# Patient Record
Sex: Male | Born: 1950 | Race: White | Hispanic: No | Marital: Married | Smoking: Current every day smoker
Health system: Southern US, Community
[De-identification: ages and names within clinical notes are randomized; demographics above are authoritative.]

## PROBLEM LIST (undated history)

## (undated) DIAGNOSIS — F17210 Nicotine dependence, cigarettes, uncomplicated: Secondary | ICD-10-CM

## (undated) DIAGNOSIS — S069X9A Unspecified intracranial injury with loss of consciousness of unspecified duration, initial encounter: Secondary | ICD-10-CM

## (undated) DIAGNOSIS — G8929 Other chronic pain: Secondary | ICD-10-CM

## (undated) DIAGNOSIS — R569 Unspecified convulsions: Secondary | ICD-10-CM

## (undated) DIAGNOSIS — S069XAA Unspecified intracranial injury with loss of consciousness status unknown, initial encounter: Secondary | ICD-10-CM

## (undated) DIAGNOSIS — R110 Nausea: Secondary | ICD-10-CM

## (undated) DIAGNOSIS — G40909 Epilepsy, unspecified, not intractable, without status epilepticus: Secondary | ICD-10-CM

## (undated) DIAGNOSIS — M549 Dorsalgia, unspecified: Secondary | ICD-10-CM

## (undated) DIAGNOSIS — I259 Chronic ischemic heart disease, unspecified: Secondary | ICD-10-CM

## (undated) DIAGNOSIS — Z87898 Personal history of other specified conditions: Secondary | ICD-10-CM

## (undated) DIAGNOSIS — J449 Chronic obstructive pulmonary disease, unspecified: Secondary | ICD-10-CM

## (undated) DIAGNOSIS — K859 Acute pancreatitis without necrosis or infection, unspecified: Secondary | ICD-10-CM

## (undated) DIAGNOSIS — I509 Heart failure, unspecified: Secondary | ICD-10-CM

## (undated) DIAGNOSIS — E7800 Pure hypercholesterolemia, unspecified: Secondary | ICD-10-CM

## (undated) HISTORY — DX: Chronic ischemic heart disease, unspecified: I25.9

## (undated) HISTORY — DX: Dorsalgia, unspecified: M54.9

## (undated) HISTORY — DX: Chronic obstructive pulmonary disease, unspecified: J44.9

## (undated) HISTORY — DX: Nicotine dependence, cigarettes, uncomplicated: F17.210

## (undated) HISTORY — DX: Pure hypercholesterolemia, unspecified: E78.00

## (undated) HISTORY — DX: Nausea: R11.0

## (undated) HISTORY — DX: Personal history of other specified conditions: Z87.898

## (undated) HISTORY — PX: BACK SURGERY: SHX140

## (undated) HISTORY — PX: FACIAL RECONSTRUCTION SURGERY: SHX631

## (undated) HISTORY — PX: SHOULDER SURGERY: SHX246

## (undated) HISTORY — DX: Heart failure, unspecified: I50.9

## (undated) HISTORY — DX: Other chronic pain: G89.29

## (undated) HISTORY — DX: Epilepsy, unspecified, not intractable, without status epilepticus: G40.909

---

## 1998-03-07 ENCOUNTER — Inpatient Hospital Stay (HOSPITAL_COMMUNITY): Admission: EM | Admit: 1998-03-07 | Discharge: 1998-03-10 | Payer: Self-pay | Admitting: Emergency Medicine

## 1998-04-19 ENCOUNTER — Encounter: Admission: RE | Admit: 1998-04-19 | Discharge: 1998-04-19 | Payer: Self-pay | Admitting: Family Medicine

## 1998-05-07 ENCOUNTER — Other Ambulatory Visit: Admission: RE | Admit: 1998-05-07 | Discharge: 1998-05-07 | Payer: Self-pay | Admitting: Cardiology

## 1998-06-02 ENCOUNTER — Encounter: Admission: RE | Admit: 1998-06-02 | Discharge: 1998-08-31 | Payer: Self-pay | Admitting: Orthopaedic Surgery

## 1998-06-14 ENCOUNTER — Inpatient Hospital Stay (HOSPITAL_COMMUNITY): Admission: EM | Admit: 1998-06-14 | Discharge: 1998-06-21 | Payer: Self-pay | Admitting: Emergency Medicine

## 1998-06-14 ENCOUNTER — Encounter: Payer: Self-pay | Admitting: Emergency Medicine

## 1998-10-23 HISTORY — PX: CORONARY ARTERY BYPASS GRAFT: SHX141

## 1999-03-03 ENCOUNTER — Encounter: Payer: Self-pay | Admitting: Neurosurgery

## 1999-03-04 ENCOUNTER — Inpatient Hospital Stay (HOSPITAL_COMMUNITY): Admission: RE | Admit: 1999-03-04 | Discharge: 1999-03-05 | Payer: Self-pay | Admitting: Neurosurgery

## 1999-03-04 ENCOUNTER — Encounter: Payer: Self-pay | Admitting: Neurosurgery

## 1999-04-05 ENCOUNTER — Encounter: Payer: Self-pay | Admitting: Emergency Medicine

## 1999-04-05 ENCOUNTER — Inpatient Hospital Stay (HOSPITAL_COMMUNITY): Admission: EM | Admit: 1999-04-05 | Discharge: 1999-04-07 | Payer: Self-pay | Admitting: Emergency Medicine

## 1999-04-13 ENCOUNTER — Encounter: Admission: RE | Admit: 1999-04-13 | Discharge: 1999-04-13 | Payer: Self-pay | Admitting: Family Medicine

## 1999-05-16 ENCOUNTER — Encounter: Admission: RE | Admit: 1999-05-16 | Discharge: 1999-05-16 | Payer: Self-pay | Admitting: Family Medicine

## 1999-07-20 ENCOUNTER — Encounter: Admission: RE | Admit: 1999-07-20 | Discharge: 1999-08-12 | Payer: Self-pay | Admitting: Neurosurgery

## 1999-08-25 ENCOUNTER — Encounter: Admission: RE | Admit: 1999-08-25 | Discharge: 1999-08-25 | Payer: Self-pay | Admitting: Family Medicine

## 1999-11-09 ENCOUNTER — Encounter: Admission: RE | Admit: 1999-11-09 | Discharge: 1999-11-09 | Payer: Self-pay | Admitting: Family Medicine

## 1999-11-10 ENCOUNTER — Encounter: Admission: RE | Admit: 1999-11-10 | Discharge: 1999-11-10 | Payer: Self-pay | Admitting: Family Medicine

## 1999-12-27 ENCOUNTER — Inpatient Hospital Stay (HOSPITAL_COMMUNITY): Admission: EM | Admit: 1999-12-27 | Discharge: 2000-01-01 | Payer: Self-pay | Admitting: Emergency Medicine

## 1999-12-27 ENCOUNTER — Encounter: Payer: Self-pay | Admitting: Emergency Medicine

## 2000-03-22 ENCOUNTER — Encounter: Admission: RE | Admit: 2000-03-22 | Discharge: 2000-03-22 | Payer: Self-pay | Admitting: Family Medicine

## 2000-08-02 ENCOUNTER — Inpatient Hospital Stay (HOSPITAL_COMMUNITY): Admission: EM | Admit: 2000-08-02 | Discharge: 2000-08-06 | Payer: Self-pay | Admitting: Emergency Medicine

## 2000-08-02 ENCOUNTER — Encounter: Payer: Self-pay | Admitting: Emergency Medicine

## 2000-08-03 ENCOUNTER — Encounter: Payer: Self-pay | Admitting: Family Medicine

## 2000-12-27 ENCOUNTER — Encounter: Admission: RE | Admit: 2000-12-27 | Discharge: 2000-12-27 | Payer: Self-pay | Admitting: Family Medicine

## 2001-01-16 ENCOUNTER — Encounter: Payer: Self-pay | Admitting: Emergency Medicine

## 2001-01-16 ENCOUNTER — Inpatient Hospital Stay (HOSPITAL_COMMUNITY): Admission: EM | Admit: 2001-01-16 | Discharge: 2001-01-19 | Payer: Self-pay | Admitting: Emergency Medicine

## 2001-02-25 ENCOUNTER — Encounter: Admission: RE | Admit: 2001-02-25 | Discharge: 2001-02-25 | Payer: Self-pay | Admitting: Family Medicine

## 2001-03-01 ENCOUNTER — Encounter: Payer: Self-pay | Admitting: Family Medicine

## 2001-03-01 ENCOUNTER — Encounter: Admission: RE | Admit: 2001-03-01 | Discharge: 2001-03-01 | Payer: Self-pay | Admitting: Family Medicine

## 2001-03-11 ENCOUNTER — Encounter: Admission: RE | Admit: 2001-03-11 | Discharge: 2001-03-11 | Payer: Self-pay | Admitting: Family Medicine

## 2001-03-12 ENCOUNTER — Ambulatory Visit (HOSPITAL_COMMUNITY): Admission: RE | Admit: 2001-03-12 | Discharge: 2001-03-12 | Payer: Self-pay | Admitting: Family Medicine

## 2001-03-12 ENCOUNTER — Observation Stay (HOSPITAL_COMMUNITY): Admission: EM | Admit: 2001-03-12 | Discharge: 2001-03-13 | Payer: Self-pay | Admitting: Emergency Medicine

## 2001-03-12 ENCOUNTER — Encounter: Admission: RE | Admit: 2001-03-12 | Discharge: 2001-03-12 | Payer: Self-pay | Admitting: Family Medicine

## 2001-03-12 ENCOUNTER — Encounter: Payer: Self-pay | Admitting: Emergency Medicine

## 2001-03-14 ENCOUNTER — Ambulatory Visit (HOSPITAL_COMMUNITY): Admission: RE | Admit: 2001-03-14 | Discharge: 2001-03-14 | Payer: Self-pay | Admitting: Family Medicine

## 2001-06-01 ENCOUNTER — Inpatient Hospital Stay (HOSPITAL_COMMUNITY): Admission: EM | Admit: 2001-06-01 | Discharge: 2001-06-04 | Payer: Self-pay | Admitting: *Deleted

## 2001-06-01 ENCOUNTER — Encounter: Payer: Self-pay | Admitting: Sports Medicine

## 2001-06-27 ENCOUNTER — Encounter: Admission: RE | Admit: 2001-06-27 | Discharge: 2001-06-27 | Payer: Self-pay | Admitting: Family Medicine

## 2001-07-19 ENCOUNTER — Inpatient Hospital Stay (HOSPITAL_COMMUNITY): Admission: EM | Admit: 2001-07-19 | Discharge: 2001-07-25 | Payer: Self-pay | Admitting: Emergency Medicine

## 2001-07-19 ENCOUNTER — Encounter: Payer: Self-pay | Admitting: Cardiology

## 2001-07-19 ENCOUNTER — Encounter: Payer: Self-pay | Admitting: Emergency Medicine

## 2001-09-02 ENCOUNTER — Encounter: Admission: RE | Admit: 2001-09-02 | Discharge: 2001-09-02 | Payer: Self-pay | Admitting: Family Medicine

## 2001-09-11 ENCOUNTER — Encounter: Admission: RE | Admit: 2001-09-11 | Discharge: 2001-09-11 | Payer: Self-pay | Admitting: Family Medicine

## 2002-01-27 ENCOUNTER — Inpatient Hospital Stay (HOSPITAL_COMMUNITY): Admission: EM | Admit: 2002-01-27 | Discharge: 2002-01-29 | Payer: Self-pay | Admitting: Emergency Medicine

## 2002-01-27 ENCOUNTER — Encounter: Payer: Self-pay | Admitting: Emergency Medicine

## 2002-03-27 ENCOUNTER — Encounter: Admission: RE | Admit: 2002-03-27 | Discharge: 2002-03-27 | Payer: Self-pay | Admitting: Family Medicine

## 2002-03-28 ENCOUNTER — Encounter: Payer: Self-pay | Admitting: Family Medicine

## 2002-03-28 ENCOUNTER — Ambulatory Visit (HOSPITAL_COMMUNITY): Admission: RE | Admit: 2002-03-28 | Discharge: 2002-03-28 | Payer: Self-pay | Admitting: Family Medicine

## 2002-04-04 ENCOUNTER — Encounter: Admission: RE | Admit: 2002-04-04 | Discharge: 2002-04-04 | Payer: Self-pay | Admitting: Family Medicine

## 2002-08-29 ENCOUNTER — Encounter: Admission: RE | Admit: 2002-08-29 | Discharge: 2002-08-29 | Payer: Self-pay | Admitting: Family Medicine

## 2002-11-26 ENCOUNTER — Inpatient Hospital Stay (HOSPITAL_COMMUNITY): Admission: EM | Admit: 2002-11-26 | Discharge: 2002-12-03 | Payer: Self-pay | Admitting: Emergency Medicine

## 2002-11-26 ENCOUNTER — Encounter: Payer: Self-pay | Admitting: Emergency Medicine

## 2002-12-18 ENCOUNTER — Encounter: Admission: RE | Admit: 2002-12-18 | Discharge: 2002-12-18 | Payer: Self-pay | Admitting: Family Medicine

## 2003-04-13 ENCOUNTER — Encounter: Admission: RE | Admit: 2003-04-13 | Discharge: 2003-04-13 | Payer: Self-pay | Admitting: Family Medicine

## 2003-08-11 ENCOUNTER — Encounter: Admission: RE | Admit: 2003-08-11 | Discharge: 2003-08-11 | Payer: Self-pay | Admitting: Sports Medicine

## 2003-11-18 ENCOUNTER — Encounter: Admission: RE | Admit: 2003-11-18 | Discharge: 2003-11-18 | Payer: Self-pay | Admitting: Family Medicine

## 2004-04-29 ENCOUNTER — Encounter: Admission: RE | Admit: 2004-04-29 | Discharge: 2004-04-29 | Payer: Self-pay | Admitting: Sports Medicine

## 2004-08-24 ENCOUNTER — Inpatient Hospital Stay (HOSPITAL_COMMUNITY): Admission: EM | Admit: 2004-08-24 | Discharge: 2004-08-24 | Payer: Self-pay

## 2004-08-24 ENCOUNTER — Ambulatory Visit: Payer: Self-pay | Admitting: Family Medicine

## 2004-08-30 ENCOUNTER — Ambulatory Visit: Payer: Self-pay | Admitting: Family Medicine

## 2004-09-02 ENCOUNTER — Encounter: Admission: RE | Admit: 2004-09-02 | Discharge: 2004-09-02 | Payer: Self-pay | Admitting: Sports Medicine

## 2004-09-08 ENCOUNTER — Ambulatory Visit: Payer: Self-pay | Admitting: Sports Medicine

## 2005-08-03 ENCOUNTER — Inpatient Hospital Stay (HOSPITAL_COMMUNITY): Admission: EM | Admit: 2005-08-03 | Discharge: 2005-08-05 | Payer: Self-pay | Admitting: Emergency Medicine

## 2006-03-30 ENCOUNTER — Encounter: Admission: RE | Admit: 2006-03-30 | Discharge: 2006-03-30 | Payer: Self-pay | Admitting: Psychiatry

## 2006-04-26 ENCOUNTER — Ambulatory Visit: Payer: Self-pay | Admitting: Family Medicine

## 2006-05-11 ENCOUNTER — Ambulatory Visit: Payer: Self-pay | Admitting: Family Medicine

## 2006-06-08 ENCOUNTER — Ambulatory Visit (HOSPITAL_COMMUNITY): Admission: RE | Admit: 2006-06-08 | Discharge: 2006-06-08 | Payer: Self-pay | Admitting: Family Medicine

## 2006-06-08 ENCOUNTER — Ambulatory Visit: Payer: Self-pay | Admitting: Family Medicine

## 2006-07-27 ENCOUNTER — Ambulatory Visit: Payer: Self-pay | Admitting: Family Medicine

## 2006-08-17 ENCOUNTER — Ambulatory Visit: Payer: Self-pay | Admitting: Family Medicine

## 2006-08-17 ENCOUNTER — Encounter: Payer: Self-pay | Admitting: Internal Medicine

## 2006-08-17 ENCOUNTER — Inpatient Hospital Stay (HOSPITAL_COMMUNITY): Admission: EM | Admit: 2006-08-17 | Discharge: 2006-08-18 | Payer: Self-pay | Admitting: Emergency Medicine

## 2006-08-17 ENCOUNTER — Encounter: Payer: Self-pay | Admitting: Vascular Surgery

## 2006-08-17 ENCOUNTER — Ambulatory Visit: Payer: Self-pay | Admitting: Internal Medicine

## 2006-12-20 DIAGNOSIS — F17200 Nicotine dependence, unspecified, uncomplicated: Secondary | ICD-10-CM

## 2006-12-20 DIAGNOSIS — K219 Gastro-esophageal reflux disease without esophagitis: Secondary | ICD-10-CM | POA: Insufficient documentation

## 2006-12-20 DIAGNOSIS — R569 Unspecified convulsions: Secondary | ICD-10-CM | POA: Insufficient documentation

## 2006-12-20 DIAGNOSIS — E7800 Pure hypercholesterolemia, unspecified: Secondary | ICD-10-CM | POA: Insufficient documentation

## 2006-12-20 DIAGNOSIS — I2510 Atherosclerotic heart disease of native coronary artery without angina pectoris: Secondary | ICD-10-CM | POA: Insufficient documentation

## 2006-12-20 DIAGNOSIS — F5232 Male orgasmic disorder: Secondary | ICD-10-CM

## 2007-03-08 ENCOUNTER — Inpatient Hospital Stay (HOSPITAL_COMMUNITY): Admission: EM | Admit: 2007-03-08 | Discharge: 2007-03-20 | Payer: Self-pay | Admitting: Emergency Medicine

## 2007-03-12 HISTORY — PX: CARDIAC CATHETERIZATION: SHX172

## 2007-08-01 ENCOUNTER — Ambulatory Visit: Payer: Self-pay | Admitting: Family Medicine

## 2007-08-02 ENCOUNTER — Encounter (INDEPENDENT_AMBULATORY_CARE_PROVIDER_SITE_OTHER): Payer: Self-pay | Admitting: *Deleted

## 2007-08-21 ENCOUNTER — Encounter (INDEPENDENT_AMBULATORY_CARE_PROVIDER_SITE_OTHER): Payer: Self-pay | Admitting: Family Medicine

## 2007-08-21 ENCOUNTER — Ambulatory Visit: Payer: Self-pay | Admitting: Family Medicine

## 2007-08-21 DIAGNOSIS — I5022 Chronic systolic (congestive) heart failure: Secondary | ICD-10-CM

## 2007-08-23 ENCOUNTER — Encounter (INDEPENDENT_AMBULATORY_CARE_PROVIDER_SITE_OTHER): Payer: Self-pay | Admitting: Family Medicine

## 2007-12-25 ENCOUNTER — Ambulatory Visit: Payer: Self-pay | Admitting: Family Medicine

## 2007-12-25 ENCOUNTER — Encounter (INDEPENDENT_AMBULATORY_CARE_PROVIDER_SITE_OTHER): Payer: Self-pay | Admitting: Family Medicine

## 2008-01-09 ENCOUNTER — Encounter (INDEPENDENT_AMBULATORY_CARE_PROVIDER_SITE_OTHER): Payer: Self-pay | Admitting: Family Medicine

## 2008-04-13 ENCOUNTER — Emergency Department (HOSPITAL_COMMUNITY): Admission: EM | Admit: 2008-04-13 | Discharge: 2008-04-14 | Payer: Self-pay | Admitting: Emergency Medicine

## 2008-07-21 ENCOUNTER — Ambulatory Visit: Payer: Self-pay | Admitting: Family Medicine

## 2008-07-24 ENCOUNTER — Telehealth: Payer: Self-pay | Admitting: *Deleted

## 2008-08-19 ENCOUNTER — Telehealth: Payer: Self-pay | Admitting: *Deleted

## 2008-08-20 ENCOUNTER — Telehealth (INDEPENDENT_AMBULATORY_CARE_PROVIDER_SITE_OTHER): Payer: Self-pay | Admitting: Family Medicine

## 2008-10-12 ENCOUNTER — Encounter: Payer: Self-pay | Admitting: Sports Medicine

## 2008-10-18 ENCOUNTER — Encounter: Payer: Self-pay | Admitting: Sports Medicine

## 2008-10-26 ENCOUNTER — Encounter: Payer: Self-pay | Admitting: Sports Medicine

## 2009-01-26 ENCOUNTER — Ambulatory Visit: Payer: Self-pay | Admitting: Family Medicine

## 2009-02-08 ENCOUNTER — Telehealth (INDEPENDENT_AMBULATORY_CARE_PROVIDER_SITE_OTHER): Payer: Self-pay | Admitting: *Deleted

## 2009-02-08 ENCOUNTER — Ambulatory Visit: Payer: Self-pay | Admitting: Family Medicine

## 2010-01-06 ENCOUNTER — Encounter: Payer: Self-pay | Admitting: Sports Medicine

## 2010-08-15 ENCOUNTER — Encounter: Payer: Self-pay | Admitting: Sports Medicine

## 2010-08-24 ENCOUNTER — Ambulatory Visit: Payer: Self-pay | Admitting: Cardiology

## 2010-08-29 ENCOUNTER — Encounter: Payer: Self-pay | Admitting: Sports Medicine

## 2010-09-09 ENCOUNTER — Encounter: Admission: RE | Admit: 2010-09-09 | Discharge: 2010-09-09 | Payer: Self-pay | Admitting: Family Medicine

## 2010-09-09 ENCOUNTER — Ambulatory Visit: Payer: Self-pay | Admitting: Family Medicine

## 2010-09-09 ENCOUNTER — Telehealth: Payer: Self-pay | Admitting: *Deleted

## 2010-10-28 ENCOUNTER — Ambulatory Visit
Admission: RE | Admit: 2010-10-28 | Discharge: 2010-10-28 | Payer: Self-pay | Source: Home / Self Care | Attending: Family Medicine | Admitting: Family Medicine

## 2010-11-24 NOTE — Miscellaneous (Signed)
  Clinical Lists Changes  Problems: Changed problem from CHF (ICD-428.0) to CHRONIC SYSTOLIC HEART FAILURE (ICD-428.22)

## 2010-11-24 NOTE — Assessment & Plan Note (Signed)
Summary: head & neck pain,df   Vital Signs:  Patient profile:   60 year old male Height:      71.5 inches Weight:      169.13 pounds BMI:     23.34 Temp:     98.0 degrees F oral Pulse rate:   62 / minute BP sitting:   128 / 79  (right arm) Cuff size:   regular  Vitals Entered By: Jimmy Footman, CMA (September 09, 2010 10:43 AM) Is Patient Diabetic? No Pain Assessment Patient in pain? yes     Location: lefts side Intensity: 10+ Type: burning,sharp   Primary Care Provider:  Rodney Langton MD   History of Present Illness: 60 yo male with neck pain and seizures.  Seizures:  Only on depakote.  No other seizure meds. Apparently seeing and MD at [facility] neurology.  Pt has seizures daily.  Mostly at night.  Does not drive.  Pt unaware if neurologist knows about his daily seizures.  Neck pain:  Present 1 week. No trauma, no cause per pt.  No precipitating factors, better with heating pad.  L neck pain, radiating as a burning sensation down to hands, C5-T1 distribution per pt, all of arm and hand, medial and lateral aspects.    Current Medications (verified): 1)  Bayer Aspirin 325 Mg Tabs (Aspirin) .... Take 1 Tablet By Mouth Once A Day 2)  Fioricet 50-325-40 Mg Tabs (Butalbital-Apap-Caffeine) .... Take Up To 4 Tablet By Mouth Every 24 Hours 3)  Depakote 500 Mg  Tbec (Divalproex Sodium) .Marland Kitchen.. 1 Tablet 3 Times A Day 4)  Pravachol 20 Mg Tabs (Pravastatin Sodium) .... 2 Tablets A Day For Cholesterol 5)  Lasix 40 Mg Tabs (Furosemide) .Marland Kitchen.. 1 Tablet in Am 6)  Ranitidine Hcl 300 Mg Tabs (Ranitidine Hcl) .Marland Kitchen.. 1 Tablet in Am 7)  Lisinopril 5 Mg  Tabs (Lisinopril) .... Take 1 Tab Daily 8)  Carvedilol 6.25 Mg Tabs (Carvedilol) .Marland Kitchen.. 1 Tab By Mouth Bid 9)  Neurontin 300 Mg Caps (Gabapentin) .... One By Mouth Three Times A Day X 1 Week, Then Two Tabs By Mouth Three Times A Day If Pain Unresolved. 10)  Prednisone 50 Mg Tabs (Prednisone) .... One Tab By Mouth Daily X 5d 11)  Naprosyn 500 Mg Tabs  (Naproxen) .... One Tab By Mouth Two Times A Day For Pain  Allergies (verified): No Known Drug Allergies  Review of Systems       See HPI  Physical Exam  General:  Well-developed,well-nourished,in no acute distress; alert,appropriate and cooperative throughout examination Head:  Normocephalic and atraumatic without obvious abnormalities. Eyes:  No corneal or conjunctival inflammation noted. EOMI. Perrl Ears:  External ear exam shows no significant lesions or deformities. Nose:  External nasal examination shows no deformity or inflammation Mouth:  Oral mucosa and oropharynx without lesions or exudates.   Neck:  ROM limited to about 30 deg of rotation either side, 30 deg flexion, 30 deg extension.  POS spurlings on L neck rotation. Lungs:  Normal respiratory effort, chest expands symmetrically. Lungs are clear to auscultation, no crackles or wheezes. Heart:  Normal rate and regular rhythm. S1 and S2 normal without gallop, murmur, click, rub or other extra sounds. Neurologic:  Sensation decreased to soft touch on entire L arm.  DTRs 2+ biceps, triceps, brachioradialis.  Strength 5/5 to all movements.  Pulses 2+.   Impression & Recommendations:  Problem # 1:  CERVICAL RADICULOPATHY, LEFT (ICD-723.4) Assessment New XR with diffuse spondylosis. Prednisone burst. Neurontin up-taper. Naproxen  for pain. RTC 1-2 weeks to reassess.  Orders: [facility]- Est  Level 4 (04540) Diagnostic X-Sussman/Fluoroscopy (Diagnostic X-Annunziato/Flu)  Problem # 2:  CONVULSIONS, SEIZURES, NOS (ICD-780.39) Assessment: Deteriorated Called Dr. Geronimo Boot office, appt made for Monday 9am to manage seizures.  His updated medication list for this problem includes:    Depakote 500 Mg Tbec (Divalproex sodium) .Marland Kitchen... 1 tablet 3 times a day    Neurontin 300 Mg Caps (Gabapentin) ..... One by mouth three times a day x 1 week, then two tabs by mouth three times a day if pain unresolved.  Orders: [facility]- Est  Level 4  (98119)  Complete Medication List: 1)  Bayer Aspirin 325 Mg Tabs (Aspirin) .... Take 1 tablet by mouth once a day 2)  Fioricet 50-325-40 Mg Tabs (Butalbital-apap-caffeine) .... Take up to 4 tablet by mouth every 24 hours 3)  Depakote 500 Mg Tbec (Divalproex sodium) .Marland Kitchen.. 1 tablet 3 times a day 4)  Pravachol 20 Mg Tabs (Pravastatin sodium) .... 2 tablets a day for cholesterol 5)  Lasix 40 Mg Tabs (Furosemide) .Marland Kitchen.. 1 tablet in am 6)  Ranitidine Hcl 300 Mg Tabs (Ranitidine hcl) .Marland Kitchen.. 1 tablet in am 7)  Lisinopril 5 Mg Tabs (Lisinopril) .... Take 1 tab daily 8)  Carvedilol 6.25 Mg Tabs (Carvedilol) .Marland Kitchen.. 1 tab by mouth bid 9)  Neurontin 300 Mg Caps (Gabapentin) .... One by mouth three times a day x 1 week, then two tabs by mouth three times a day if pain unresolved. 10)  Prednisone 50 Mg Tabs (Prednisone) .... One tab by mouth daily x 5d 11)  Naprosyn 500 Mg Tabs (Naproxen) .... One tab by mouth two times a day for pain  Patient Instructions: 1)  Neurontin as directed. 2)  Prednisone as directed. 3)  Xrays. 4)  Naproxen for pain. 5)  You need to see your Neurologist at [facility] ASAP. 6)  Come back to see me in 1 week. 7)  -Dr. Karie Schwalbe. Prescriptions: NAPROSYN 500 MG TABS (NAPROXEN) One tab by mouth two times a day for pain  #30 x 0   Entered and Authorized by:   Rodney Langton MD   Signed by:   Rodney Langton MD on 09/09/2010   Method used:   Print then Give to Patient   RxID:   [id number] PREDNISONE 50 MG TABS (PREDNISONE) One tab by mouth daily x 5d  #5 x 0   Entered and Authorized by:   Rodney Langton MD   Signed by:   Rodney Langton MD on 09/09/2010   Method used:   Print then Give to Patient   RxID:   [id number] NEURONTIN 300 MG CAPS (GABAPENTIN) One by mouth three times a day x 1 week, then two tabs by mouth three times a day if pain unresolved.  #90 x 0   Entered and Authorized by:   Rodney Langton MD   Signed by:   Rodney Langton MD on 09/09/2010    Method used:   Print then Give to Patient   RxID:   [id number]    Orders Added: 1)  [facility]- Est  Level 4 [42595] 2)  Diagnostic X-Economos/Fluoroscopy [Diagnostic X-Buckner/Flu]

## 2010-11-24 NOTE — Miscellaneous (Signed)
  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

## 2010-11-24 NOTE — Progress Notes (Signed)
Summary: results  Phone Note Call from Patient Call back at Home Phone [phone]   Caller: Patient Summary of Call: pt is asking about xray results Initial call taken by: De Nurse,  September 09, 2010 3:53 PM  Follow-up for Phone Call        Severe spondylosis, or arthritis of spine. Follow-up by: Rodney Langton MD,  September 09, 2010 9:59 PM  Additional Follow-up for Phone Call Additional follow up Details #1::        pt is calling again about results Additional Follow-up by: De Nurse,  September 12, 2010 10:45 AM    Additional Follow-up for Phone Call Additional follow up Details #2::    Informed pt of xray results. Pt was to have a nuerologist appt today @ [facility]. He states that he did not have to go because he spoke with the nuerologist office. They asked him how many seizures he was having and he explained to them how many and how often...they said because it is the same issues there was no need for him to make the trip to [facility] to be seen in the office.  Follow-up by: Jimmy Footman, CMA,  September 12, 2010 12:11 PM

## 2010-11-24 NOTE — Miscellaneous (Signed)
 Summary: Orders Update  Clinical Lists Changes  Orders: Added new Test order of Lipid-[facility] [phone]) - Signed Added new Test order of PSA (Medicare)-[facility] (G0103) - Signed  Appended Document: Orders Update Advised pt that md wanted him to schedule lab appt and he said no, my heart doctor checks my blood everytime - Pt can be reached at [phone].  Appended Document: Orders Update patient reports he gets labwork regularly at Cardiologist office.  appointment scheduled here for labs on 10/27/07. advised patient Cardiologist probably has not checked PSA so he is agreeable to go ahead and schedule lab visit. contacted Cardiologist and he last had a lipid done  March 2009. is due to return in Feb 2010. will send message to MD to ask if lipids are still necessary at this time. will place lab report in MD box.  Appended Document: Orders Update Hi, Lipid panel cancelled, no need to do it again, thanks for the update, and we will still do the PSA.  -Dr. ONEIDA.  Appended Document: Orders Update    Clinical Lists Changes  Observations: Added new observation of ECHODUE: None (10/18/2008 12:10) Added new observation of LDLNXTDUE: 01/05/2009 (10/18/2008 12:10) Added new observation of HDLNXTDUE: 01/05/2009 (10/18/2008 12:10) Added new observation of LDL: 100 mg/dL (96/83/7990 87:88) Added new observation of HDL: 48 mg/dL (96/83/7990 87:89) Added new observation of CARDIAC EF: 15% % (03/12/2007 12:20) Added new observation of CARDIAC EF: 47% % (08/04/2005 12:15)      HDL Result Date:  01/06/2008 HDL Result:  48 HDL Next Due:  1 yr LDL Result Date:  01/06/2008 LDL Result:  100 LDL Next Due:  1 yr Ejection Fraction Result Date:  03/12/2007 Ejection Fraction Result:  15%

## 2010-11-24 NOTE — Letter (Signed)
Summary: Generic Letter  [facility]  [address]   [place], [place] [address]   Phone: [phone]  Fax: [phone]    01/06/2010  [name] [address] [place], [place]  [address]  Dear [name],   [name] are overdue for some preventive medical tests.  Please make an appt to see me as soon as is convenient for you.     Sincerely,   Rodney Langton MD

## 2010-11-24 NOTE — Assessment & Plan Note (Signed)
 Summary: shot for back/flu/wp   Vital Signs:  Patient Profile:   60 Years Old Male Weight:      174.5 pounds Temp:     98.3 degrees F oral Pulse rate:   76 / minute BP sitting:   148 / 90  (right arm)  Pt. in pain?   yes    Location:   lower back    Intensity:   8  Vitals Entered By: JACK BLOODGOOD CMA, (July 21, 2008 3:04 PM)              Is Patient Diabetic? No    Last Flu Vaccine:  Fluvax 3+ (08/01/2007 8:53:04 AM) Flu Vaccine Result Date:  07/21/2008 Flu Vaccine Result:  given Flu Vaccine Next Due:  1 yr TD Result Date:  07/21/2008 TD Result:  given TD Next Due:  10 yr   PCP:  DEBBY PETTIES MD  Chief Complaint:  back pain and sinus congestion .  History of Present Illness: 60 year old male with recent URI, back pain, and requesting Flu, pneumococcal, and Tetanus shots.  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  Pt has had chronic back pain, and typically flares up when the weather changes.  Has had many trigger point injections that relieve his symptoms.  No signs of acute cord compression, no new changes in bowel or bladder function.    Also c/o sinus congestion/pain/pressure x5 days.  He has this often since his facial surgery after his fall injury.  Typically responds to amoxicillin .  No fever, sore throat, cough, SOB.    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        Disc surg--5/00 -, vasc u/s- vert & carotid- no sig dz - 08/27/2006     Family History:    Reviewed history from 12/25/2007 and no changes required:       Mother w/ bone CA, uncle (father`s side) w/ bone CA.    Social History:    Reviewed history from 12/25/2007 and no changes required:       Married. 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  Smoking 2-3 ppd.     Risk Factors:     Counseled to quit/cut down tobacco use:  yes   Review of Systems       As above  in HPI   Physical Exam  General:     Well-developed,well-nourished,in no acute distress; alert,appropriate and cooperative throughout examination Head:     Normocephalic and atraumatic without obvious abnormalities.  Eyes:  No corneal or conjunctival inflammation noted. EOMI. Perrla. Ears:     External ear exam shows no significant lesions or deformities.  Otoscopic examination reveals clear canals, tympanic membranes are intact bilaterally without bulging, retraction, inflammation or discharge. Hearing is grossly normal bilaterally. Nose:     External nasal examination shows no deformity or inflammation. Nasal mucosa are pink and moist without lesions or exudates. Mouth:     Oral mucosa and oropharynx without lesions or exudates. Neck:     No deformities, masses, or tenderness noted. Lungs:     Normal respiratory effort, chest expands symmetrically. Lungs are clear to auscultation, no crackles or wheezes. Heart:     Normal rate and regular rhythm. S1 and S2 normal without gallop, murmur, click, rub or other extra sounds. Abdomen:     Bowel sounds positive,abdomen soft and non-tender without masses, organomegaly or hernias noted. Msk:     Strength 5/5 in upper extremeties, 4/5 left lower ext, 5/5 right lower ext.  sensation grossly intact.  Point tenderness just left lateral to spinous processes at 2 points on lumbar vertebrae, likely L2 and L3.    Pulses:     R and L carotid,radial,femoral,dorsalis pedis and posterior tibial pulses are full and equal bilaterally Neurologic:     No cranial nerve deficits noted.  Plantar reflexes are down-going bilaterally. DTRs 2+ but 1+ on left patellar. Sensory, motor and coordinative functions appear intact.    Impression & Recommendations:  Problem # 1:  BACK PAIN, LOW (ICD-724.2) Pt is a candidate for trigger point injections, I prepped the areas with betadine, anesthetized with cold spray, and injected 1/2cc Kenalog and 1/2cc 1% lidocaine  without Epi into each trigger point.  Pain resolved immediately and pt was able to sit up straight without pain.  His updated medication list for this problem includes:    Bayer Aspirin  325 Mg Tabs (Aspirin ) .SABRA... Take 1 tablet by mouth once a day    Fioricet 50-325-40 Mg Tabs (Butalbital-apap-caffeine) .SABRA... Take up to 4 tablet by mouth every 24 hours  Orders: [facility]- Est  Level 4 (00785) Trigger point injection- [facility] (79446)   Problem # 2:  CHF (ICD-428.0) Still hypertensive today, will increase lisinopril  to one full 5mg  tab daily.  I will slowly titrate his lisinopril  up to 40mg  daily and this should help his BP and reduce his cardiac mortality risk.   The following medications were removed from the medication list:    Metoprolol Tartrate 25 Mg Tabs (Metoprolol tartrate) .SABRA... Take 1 tablet by mouth twice a day    Plavix 75 Mg Tabs (Clopidogrel bisulfate) .SABRA... 1 tablet a day  His updated medication list for this problem includes:    Bayer Aspirin  325 Mg Tabs (Aspirin ) .SABRA... Take 1 tablet by mouth once a day    Lasix  40 Mg Tabs (Furosemide ) .SABRA... 1 tablet in am    Lisinopril  5 Mg Tabs (Lisinopril ) .SABRA... Take 1 tab daily    Carvedilol  6.25 Mg Tabs (Carvedilol ) .SABRA... 1 tab by mouth bid   Problem # 3:  PREVENTIVE HEALTH CARE (ICD-V70.0) Gave vaccines as listed below.  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  Will do amoxicillin  as it has helped in the past.  Pt can RTC in 2 weeks if no resolution of symptoms.   His updated medication list for this problem  includes:    Amoxicillin  500 Mg Tabs (Amoxicillin ) .SABRA... 1 tab by mouth two times a day for 10 days   Complete Medication List: 1)  Bayer Aspirin  325 Mg Tabs (Aspirin ) .... Take 1 tablet by mouth once a day 2)  Fioricet 50-325-40 Mg Tabs (Butalbital-apap-caffeine) .... Take up to 4 tablet by mouth every 24 hours 3)  Depakote  500 Mg Tbec (Divalproex  sodium) .SABRA.. 1 tablet 3 times a day 4)  Pravachol  20 Mg Tabs (Pravastatin  sodium) .... 2 tablets a day for cholesterol 5)  Lasix  40 Mg Tabs (Furosemide ) .SABRA.. 1 tablet in am 6)  Ranitidine Hcl 300 Mg Tabs (Ranitidine hcl) .SABRA.. 1 tablet in am 7)  Lisinopril  5 Mg Tabs (Lisinopril ) .... Take 1 tab daily 8)  Amoxicillin  500 Mg Tabs (Amoxicillin ) .SABRA.. 1 tab by mouth two times a day for 10 days 9)  Carvedilol  6.25 Mg Tabs (Carvedilol ) .SABRA.. 1 tab by mouth bid  Other Orders: Admin of Any Addtl Vaccine (09527)   Patient Instructions: 1)  Good to meet you today, hopefully you will feel better now that you have had the back injections.  Give it a few hours to work.  I will also send a prescription for amoxicillin  to your pharmacy.  You will take it for 10 days.  I will also increase your lisinopril  to 5mg  (whole tab) daily. 2)  Come see me again if your sinuses don't get better in a week. 3)  -Dr. Curtis.   Prescriptions: CARVEDILOL  6.25 MG TABS (CARVEDILOL ) 1 tab by mouth BID  #60 x 6   Entered and Authorized by:   DEBBY CURTIS MD   Signed by:   DEBBY CURTIS MD on 07/21/2008   Method used:   Electronically to        [facility]* (retail)       [address]       [place], [place]  [address]       Ph: [phone]       Fax: [phone]   RxID:    [id number] AMOXICILLIN  500 MG TABS (AMOXICILLIN ) 1 tab by mouth two times a day for 10 days  #20 x 0   Entered and Authorized by:   DEBBY CURTIS MD   Signed by:   DEBBY CURTIS MD on 07/21/2008   Method used:   Electronically to        [facility]* (retail)       [address]       [place], [place]  [address]       Ph: [phone]       Fax: [phone]   RxID:   [id number] LISINOPRIL  5 MG  TABS (LISINOPRIL ) take 1 tab daily  #30 x 6   Entered and Authorized by:   DEBBY CURTIS MD   Signed by:   DEBBY CURTIS MD on 07/21/2008   Method used:   Electronically to        [facility]* (retail)       [address]       [place], [place]  [address]       Ph: [phone]       Fax: [phone]   RxID:   [id number]  ]  Tetanus/Td Vaccine    Vaccine Type: Tdap    Site: left deltoid    Mfr: Sanofi Pasteur    Dose: 0.5 ml    Route: IM    Given by: JACK BLOODGOOD CMA,    Exp. Date: 09/24/2010  Lot #: [id number]    VIS given: 09/10/07 version given July 21, 2008.  Pneumovax Vaccine    Vaccine Type: Pneumovax    Site: right deltoid    Mfr: Merck    Dose: 0.5 ml    Route: IM    Given by: JACK BLOODGOOD CMA,    Exp. Date: 09/17/2009    Lot #: [id number]    VIS given: 05/20/96 version given July 21, 2008.  Influenza Vaccine    Vaccine Type: Fluvax Non-MCR    Site: right deltoid    Mfr: GlaxoSmithKline    Dose: 0.5 ml    Route: IM    Given by: JACK BLOODGOOD CMA,    Exp. Date: 04/21/2009    Lot #: [id number]    VIS given: 05/16/07 version given July 21, 2008.  Flu Vaccine Consent Questions    Do you have a history of severe allergic reactions to this vaccine? no    Any prior history of allergic reactions to egg and/or gelatin? no    Do you have a sensitivity to the preservative Thimersol? no    Do you have a past history of Guillan-Barre Syndrome? no    Do you currently have an acute febrile illness? no    Have you ever  had a severe reaction to latex? no    Vaccine information given and explained to patient? yes  Appended Document: shot for back/flu/wp              Complete Medication List: 1)  Bayer Aspirin  325 Mg Tabs (Aspirin ) .... Take 1 tablet by mouth once a day 2)  Fioricet 50-325-40 Mg Tabs (Butalbital-apap-caffeine) .... Take up to 4 tablet by mouth every 24 hours 3)  Depakote  500 Mg Tbec (Divalproex  sodium) .SABRA.. 1 tablet 3 times a day 4)  Pravachol  20 Mg Tabs (Pravastatin  sodium) .... 2 tablets a day for cholesterol 5)  Lasix  40 Mg Tabs (Furosemide ) .SABRA.. 1 tablet in am 6)  Ranitidine Hcl 300 Mg Tabs (Ranitidine hcl) .SABRA.. 1 tablet in am 7)  Lisinopril  5 Mg Tabs (Lisinopril ) .... Take 1 tab daily 8)  Amoxicillin  500 Mg Tabs (Amoxicillin ) .SABRA.. 1 tab by mouth two times a day for 10 days 9)  Carvedilol  6.25 Mg Tabs (Carvedilol ) .SABRA.. 1 tab by mouth bid    ]

## 2010-11-24 NOTE — Assessment & Plan Note (Signed)
Summary: cough/congestion x 3 wks/yellow-brownish phleghm/bmc   Vital Signs:  Patient profile:   60 year old male Height:      71.5 inches Weight:      174.2 pounds BMI:     24.04 Temp:     98.2 degrees F oral Pulse rate:   70 / minute BP sitting:   133 / 81  (right arm) Cuff size:   regular  Vitals Entered By: Jimmy Footman, CMA (October 28, 2010 10:08 AM) CC: congestion x2 weeks, wet cough Is Patient Diabetic? No   Primary Care Provider:  Rodney Langton MD  CC:  congestion x2 weeks and wet cough.  History of Present Illness: Cough and congestion for 3 weeks, exposed to grandchildren who were sick.  Denies SOB or fever.  Worried as he has a heart condition and he is not getting better.  His cardiologist told him to come here to be treated with a Z pack.  He has been using robitussin DM at home.  Habits & Providers  Alcohol-Tobacco-Diet     Tobacco Status: current     Cigarette Packs/Day: 0.75  Current Medications (verified): 1)  Bayer Aspirin 325 Mg Tabs (Aspirin) .... Take 1 Tablet By Mouth Once A Day 2)  Fioricet 50-325-40 Mg Tabs (Butalbital-Apap-Caffeine) .... Take Up To 4 Tablet By Mouth Every 24 Hours 3)  Depakote 500 Mg  Tbec (Divalproex Sodium) .Marland Kitchen.. 1 Tablet 3 Times A Day 4)  Pravachol 20 Mg Tabs (Pravastatin Sodium) .... 2 Tablets A Day For Cholesterol 5)  Lasix 40 Mg Tabs (Furosemide) .Marland Kitchen.. 1 Tablet in Am 6)  Ranitidine Hcl 300 Mg Tabs (Ranitidine Hcl) .Marland Kitchen.. 1 Tablet in Am 7)  Lisinopril 5 Mg  Tabs (Lisinopril) .... Take 1 Tab Daily 8)  Carvedilol 6.25 Mg Tabs (Carvedilol) .Marland Kitchen.. 1 Tab By Mouth Bid 9)  Zithromax Z-Pak 250 Mg Tabs (Azithromycin) .... Takes As Directed 10)  Cheratussin Ac 100-10 Mg/21ml Syrp (Guaifenesin-Codeine) .... 2 Teaspoonsful Qid As Needed For Cough, 200 Cc  Allergies (verified): No Known Drug Allergies  Review of Systems General:  Denies chills, fever, and sweats. CV:  Denies chest pain or discomfort. Resp:  Complains of cough and  sputum productive; denies shortness of breath and wheezing.  Physical Exam  General:  in no acute distress Ears:  TM retracted and injected Nose:  inflammed and with discharge Mouth:  post nasal drainage Neck:  No deformities, masses, or tenderness noted. Lungs:  normal respiratory effort, normal breath sounds, no crackles, and no wheezes.   Heart:  normal rate and regular rhythm.   Extremities:  no edema   Impression & Recommendations:  Problem # 1:  SINUSITIS, ACUTE (ICD-461.9)  prolonged respiratory symptoms in patient with heart failure. His updated medication list for this problem includes:    Zithromax Z-pak 250 Mg Tabs (Azithromycin) .Marland Kitchen... Takes as directed    Cheratussin Ac 100-10 Mg/77ml Syrp (Guaifenesin-codeine) .Marland Kitchen... 2 teaspoonsful qid as needed for cough, 200 cc  Orders: [facility]- Est Level  3 (16109)  Problem # 2:  CHRONIC SYSTOLIC HEART FAILURE (ICD-428.22) no signs of acute heart failure contributing to cough His updated medication list for this problem includes:    Bayer Aspirin 325 Mg Tabs (Aspirin) .Marland Kitchen... Take 1 tablet by mouth once a day    Lasix 40 Mg Tabs (Furosemide) .Marland Kitchen... 1 tablet in am    Lisinopril 5 Mg Tabs (Lisinopril) .Marland Kitchen... Take 1 tab daily    Carvedilol 6.25 Mg Tabs (Carvedilol) .Marland Kitchen... 1 tab  by mouth bid  Orders: [facility]- Est Level  3 (11914)  Complete Medication List: 1)  Bayer Aspirin 325 Mg Tabs (Aspirin) .... Take 1 tablet by mouth once a day 2)  Fioricet 50-325-40 Mg Tabs (Butalbital-apap-caffeine) .... Take up to 4 tablet by mouth every 24 hours 3)  Depakote 500 Mg Tbec (Divalproex sodium) .Marland Kitchen.. 1 tablet 3 times a day 4)  Pravachol 20 Mg Tabs (Pravastatin sodium) .... 2 tablets a day for cholesterol 5)  Lasix 40 Mg Tabs (Furosemide) .Marland Kitchen.. 1 tablet in am 6)  Ranitidine Hcl 300 Mg Tabs (Ranitidine hcl) .Marland Kitchen.. 1 tablet in am 7)  Lisinopril 5 Mg Tabs (Lisinopril) .... Take 1 tab daily 8)  Carvedilol 6.25 Mg Tabs (Carvedilol) .Marland Kitchen.. 1 tab by mouth bid 9)   Zithromax Z-pak 250 Mg Tabs (Azithromycin) .... Takes as directed 10)  Cheratussin Ac 100-10 Mg/42ml Syrp (Guaifenesin-codeine) .... 2 teaspoonsful qid as needed for cough, 200 cc  Patient Instructions: 1)  Take the medications as directed 2)  Drink a lot of water Prescriptions: CHERATUSSIN AC 100-10 MG/5ML SYRP (GUAIFENESIN-CODEINE) 2 teaspoonsful qid as needed for cough, 200 cc Brand medically necessary #1 x 0   Entered and Authorized by:   Luretha Murphy NP   Signed by:   Luretha Murphy NP on 10/28/2010   Method used:   Print then Give to Patient   RxID:   [id number] ZITHROMAX Z-PAK 250 MG TABS (AZITHROMYCIN) takes as directed Brand medically necessary #1 x 0   Entered and Authorized by:   Luretha Murphy NP   Signed by:   Luretha Murphy NP on 10/28/2010   Method used:   Print then Give to Patient   RxID:   [id number]    Orders Added: 1)  [facility]- Est Level  3 [02725]

## 2011-03-07 NOTE — Consult Note (Signed)
[name], [name] NO.:  [id number]   MEDICAL RECORD NO.:  [id number]          PATIENT TYPE:  INP   LOCATION:  4739                         FACILITY:  [facility]   PHYSICIAN:  Maree Krabbe, M.D.DATE OF BIRTH:  1951/09/27   DATE OF CONSULTATION:  03/17/2007  DATE OF DISCHARGE:                                 CONSULTATION   REASON FOR CONSULTATION:  Elevated creatinine.   HISTORY:  The patient is a 60 year old with a long history of coronary  artery disease, prior CABG, a chronic smoker and COPD.  He was admitted  on May 16 with chest pain and shortness of breath.  He had mild CHF with  interstitial pulmonary edema, and ruled in for a non-ST elevation MI.  His cardiac enzymes were slightly elevated.  He was felt to have a  hypertensive crisis also on admission, with high blood pressures which  were treated aggressively with IV nitroglycerin and blood pressure  medications.  He was put on heparin and baseline creatinine was 0.9.  On  Mar 12, 2007 he underwent a heart catheterization, pre-catheterization  creatinine was 1.0, post-catheterization creatinines were 1.1 and 1.2 on  the following 2 days; he seemed to tolerate this well.  However, over  the last 48 hours the patient has developed a rising creatinine up to  1.8 yesterday and 2.3 today, as well as hyperkalemia with potassium 6.2  yesterday and 5.2 today.  He has been on an ACE inhibitor with dose  progression, as well as diuretics and potassium supplements -- which are  now on hold as of today.   Currently the patient has no complaints.  He is calm and denies any  shortness of breath or chest pain.   PAST MEDICAL HISTORY:  1. COPD.  2. CAD with coronary artery bypass in 1995.  3. Chronic tobacco use.  4. 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  5. Hyperlipidemia.  6. GERD.  7. Hypertension.   CURRENT MEDICATIONS:  The patient is on aspirin, Lopressor, Colace,  Depakote, Protonix, Lipitor,  Plavix. and p.r.n. medications.  Lasix,  lisinopril and Kay Ciel have been held   SOCIAL HISTORY:  Continues to smoke.  Lives with his wife.  No alcohol  or drug use.   REVIEW OF SYSTEMS:  Denies fever, chills, sweats.  ENT:  Denies hearing  loss, visual change, sore throat or difficulty swallowing.  CARDIORESPIRATORY:  As above.  GI:  No nausea, vomiting, diarrhea or  abdominal pain.  GU:  No difficulty voiding.  He does not have a Foley  catheter in.  MUSCULOSKELETAL:  No myalgia, arthralgia or ankle edema.  NEUROLOGIC:  No focal numbness or weakness.   PHYSICAL EXAMINATION:  GENERAL:  This is a pleasant middle-aged white  male in no distress.  VITAL SIGNS:  Blood pressure 110/75, temperature 98.1, pulse 62,  respirations 20, O2 saturation 97% on room air.  I/Os the last 24 hours  were 960 in and 2100 out.  The previous day was 980 in and 2450 out on  diuretics.  The patient is alert and  oriented.  SKIN:  Warm and dry without rash.  HEENT:  PERRL, EOMI.  Throat was clear.  NECK:  Supple without JVD.  Neck veins are flat.  CHEST:  Clear to the bases throughout.  CARDIAC:  Regular rate and rhythm without murmur or gallop.  ABDOMEN:  Soft, mildly obese, nontender, no bruits or organomegaly.  EXTREMITIES:  No femoral bruits.  No peripheral edema.  NEUROLOGIC:  No focal numbness or weakness.   LABS:  Sodium 132, potassium 5.2, bicarb 30, BUN 43, creatinine 2.3,  calcium 10.4 CHEST X-Prange:  (From May 16) showed interstitial edema with  CHF.  CT angiogram on 05/17 showed no PE.  Urinalysis negative for  protein, positive for red blood cells (7-10 per high-power field).   IMPRESSION:  1. Acute kidney injury secondary to a combination of poor ejection      fraction, ACE inhibitor effect, diuresis with effective volume      depletion and possible hemodynamic changes from the blood pressure      being too low.  The last dye was given on May 20 and it is not a      likely culprit.  He has  good urine output now on Lasix, but the      Lasix has just been stopped.  Agree with current management,      including stopping ACE inhibitor, Lasix and potassium.  In      addition, I will give IV fluids for 24-48 hours.  2. Non-ST elevation myocardial infarction, status post heart      catheterization  on Mar 12, 2007.  3. Prior coronary artery bypass grafting, with ejection fraction 15%.  4. Hyperkalemia.  5. Hypovolemia      Maree Krabbe, M.D.  Electronically Signed     RDS/MEDQ  D:  03/17/2007  T:  03/17/2007  Job:  [id number]

## 2011-03-07 NOTE — Discharge Summary (Signed)
NAMEZAVIEN, CLUBB                  ACCOUNT NO.:  [id number]   MEDICAL RECORD NO.:  [id number]          PATIENT TYPE:  INP   LOCATION:  4739                         FACILITY:  [facility]   PHYSICIAN:  Colleen Can. Deborah Chalk, M.D.DATE OF BIRTH:  26-Nov-1950   DATE OF ADMISSION:  03/08/2007  DATE OF DISCHARGE:  03/20/2007                               DISCHARGE SUMMARY   PRIMARY DISCHARGE DIAGNOSIS:  Chest pain with subsequent subendocardial  myocardial infarction with elective cardiac catheterization performed on  Mar 12, 2007, showing severe LV dysfunction with elevated left  ventricular filling pressures, occluded native circulation, occluded  saphenous vein graft to posterior descending and to the LAD with patent  saphenous vein graft to the acute margin and posterolateral branches.  There is a patent but severely diseased saphenous vein graft to the  diagonal.  Left internal mammary graft to the LAD is patent.  Overall,  his options were felt to be really limited and he will need medical  management.  At this point in time he is felt to be a very high risk  candidate for intervention on the saphenous vein graft to the diagonal.  He is not felt to be a candidate for redo surgery or for cardiac  transplantation in light of his other general medical condition and  history of noncompliance.   SECONDARY DISCHARGE DIAGNOSIS:  1. Extensive atherosclerotic cardiovascular disease.  He underwent      coronary artery bypass grafting in 1995 and has had multiple      procedures since that time.  2. Ongoing tobacco abuse.  3. Seizure disorder secondary to remote head trauma.  4. Dyslipidemia.  5. Noncompliance.  6. Gastroesophageal reflux disease.  7. Chronic headaches.  8. Post-procedural renal failure secondary to contrast and ACE      inhibitor therapy.   HISTORY OF PRESENT ILLNESS:  The patient is a 60 year old white male who  has multiple medical problems who presented to the hospital with  an  episode of chest pain as well as significant shortness of breath  consistent with pulmonary edema.  He was treated with nitrates,  anticoagulants and diuretics.  His cardiac enzymes were noted be  positive and consistent with subendocardial myocardial infarction.  He  underwent cardiac catheterization on Mar 12, 2007.  Those results are as  noted above.  His options were felt to be very limited.  He is not felt  to be a candidate at this point in time for high risk intervention nor  reduced coronary artery bypass grafting nor cardiac transplantation.  Medical management is felt to be the best option.  Postprocedure, he was  watched in the coronary care unit for several days.  Medicines were  initiated.  Extensive teaching was initiated as well.  It was hoped that  he would be would be discharged over the course of the Memorial Day  weekend, however, he had worsening renal insufficiency probably  multifactorial in nature.  He had been exposed to contrast as well as he  had been placed on ACE inhibitor therapy.  Renal consultation was  subsequently  called for and ACE were discontinued.  He was treated with  IV fluids.  He as progressed quite nicely since that time.  He did have  some hematuria which has resolved.  He is currently on an aspirin and  Plavix regimen.  He has had no recurrence of chest pain.  His seizure  disorder is at baseline and today, on Mar 20, 2007, he is up and  ambulatory with a walker.  Vital signs are unremarkable.  His BUN is 26  his creatinine is 1.4, his BNP is 521 and he is felt to be a stable  candidate for discharge.  Hopefully, he will follow through and present  for outpatient followup.   Discharge condition is guarded with somewhat overall poor prognosis.   DISCHARGE ACTIVITIES:  He is to use a walker as needed.  He may increase  his activity slowly.  He is reminded not to drive and at this point not  to engage in sexual activity.   His diet is to be  low sodium, heart-healthy and the dietician has been  made available to him.   He is strongly encouraged to remain off of his cigarettes.   DISCHARGE MEDICATIONS:  His discharge medicines include:  1. Aspirin 325 mg daily.  2. Plavix 75 mg daily.  He is given a coupon for 14 free pills.  3. Lopressor 25 b.i.d.  4. Depakote 500 three times a day.  5. Pravachol 40 mg a day.  6. Lasix 40 mg.  7. Potassium 20 mEq a day.  8. He will resume his Prilosec and Fioricet as he was taking before.   We have asked to go to [facility] to have his prescriptions filled.   RECOMMENDATIONS:  We have also asked him to weigh each morning and  record these readings.  If he gains more than 3 pounds in 24 hours we  will have him take an extra dose of Lasix with potassium.  He is to call  our office if any problems arise in the interim otherwise, we will plan  on seeing him approximately 1 week with repeat lab on return.      Sharlee Blew, N.P.      Colleen Can. Deborah Chalk, M.D.  Electronically Signed    LC/MEDQ  D:  03/20/2007  T:  03/20/2007  Job:  [id number]

## 2011-03-07 NOTE — Consult Note (Signed)
NAMEDREAM, NODAL                  ACCOUNT NO.:  [id number]   MEDICAL RECORD NO.:  [id number]          PATIENT TYPE:  INP   LOCATION:  4739                         FACILITY:  [facility]   PHYSICIAN:  Michael L. Reynolds, M.D.DATE OF BIRTH:  16-Dec-1950   DATE OF CONSULTATION:  03/17/2007  DATE OF DISCHARGE:                                 CONSULTATION   REQUESTING PHYSICIAN:  Dr. Verdis Prime   REASON FOR EVALUATION:  Epilepsy.   HISTORY OF PRESENT ILLNESS:  This is the initial inpatient consultation  and evaluation of this 61 year old man with a complex cardiac history as  well as a history of previous head injury with subsequent epilepsy  dating back to 68.  The patient is presently admitted for refractory  chest pain following a subendocardial MI and has had a cardiac  catheterization on Mar 12, 2007 which at this time has resulted in  medical management along with consideration of possible coronary bypass  redo.  He has had a couple of seizure events during this hospital stay  and subsequently his wife has requested neurologic opinion.  As noted  above, the patient has had seizures dating back to a head injury in  1982.  He is normally managed at [facility].  He states that he has  bee on numerous seizure medications in the past which have either not  worked or have been poorly tolerated.  These include phenobarbital,  Dilantin, Keppra, Neurontin, and others.  He has had imaging studies  done here including a CT of the head in October of 2007, an MRI of the  head in June of 2007, both demonstrating mild atrophy and chronic  microvascular ischemic changes.  The patient states that his seizures  are felt to be temporal lobe in origin related to damage there, although  that does not match up very well with any particular focal damage seen  on scans done here.  His wife states that he normally has seizures at a  rate of about 15 to 30 a month which tend to happen at least to some  extent in clusters.  These events are usually self-limited lasting a  couple of minutes at a time.  They usually consist of him sticking out  his tongue and being poorly responsive, sometimes associated with some  shaking.  He does not convulse and is particularly confused afterwards.  She does report that he has had epilepsy monitoring unit evaluations in  the past and he has had some episodes in the past which have been felt  to be non-epileptic in nature.  In the hospital, he has been continued  on his routine seizure regimen of Depakote 500 mg t.i.d., although the  dosing schedule is rather different from what he is taking at home.  His  wife states that the events that she has witnessed here in the hospital  are not atypical for him.  She believes that he has had about six events  here in the hospital over the course of his stay so far, which is  entering in his tenth  day.   PAST MEDICAL HISTORY:  Remarkable for:  1. The seizures following head injury as above.  2. He also has a long history of coronary artery disease status post      bypass grafting in the past.  His cardiac cath done on Mar 12, 2007      showed significant cardiomegaly with an EF 15 to 20%, apical      kinesis, occluded native circulation, and severely diseased grafts.   OTHER MEDICAL PROBLEMS:  Include:  1. Hypertension.  2. Dyslipidemia.  3. History of noncompliance of medications.  4. Ongoing tobacco abuse.   FAMILY/SOCIAL/REVIEW OF SYSTEMS:  As outlined in admission H&P of Mar 08, 2007 which is reviewed.   MEDICATIONS:  Prior to admission he was taking:  1. Depakote 500 mg t.i.d.  2. Prilosec.  3. Aspirin.  4. Fioricet p.r.n.   In the hospital, he is also receiving Lopressor, Colace, Lipitor,  potassium, Zestril, Plavix, Lasix.   PHYSICAL EXAMINATION:  VITAL SIGNS:  Temperature 98.1, blood pressure  139/65, pulse 67, respirations 18, O2 sat 98% on room air.  GENERAL EXAMINATION:  This is a  healthy-appearing man seen in no evident  distress.  HEART:  Cranium is normocephalic, atraumatic.  Oropharynx is benign.  NECK:  Supple without carotid bruits.  HEART:  Regular rate and rhythm without murmurs.  NEUROLOGICAL EXAMINATION:  Mental status:  He is awake and alert.  He is  oriented to time and place.  He is a little bit slow at answering  questions.  He is able to name objects and repeat phrases without  difficulty.  Cranial nerves:  Pupils are equal and reactive.  Extraocular movements are full without nystagmus.  Visual fields are  full with confrontation.  Hearing is intact to conversational speech.  Face, tongue and palate move normally and symmetrically.  Motor:  Normal  bulk and tone.  Normal strength in all tested extremity muscles.  Sensation:  Intact to light touch in all extremities.  Coordination:  Finger-to-nose performed accurate.  Gait is deferred.  Reflexes are  brisk throughout.  Toes are downgoing bilaterally.   LABORATORY REVIEW:  BMP from this morning:  Sodium 132, potassium 6.2,  BUN and creatinine 39 and 2.2, respectively.  CBC on May 23 was normal.  TSH on Mar 11, 2007 was in the high-normal range.  Depakote level on Mar 09, 2007 was 50.  It is not clear when the dosing cycle of this was  drawn.   He has not had any neuroimaging this admission.   IMPRESSION:  Intractable partial epilepsy +/- interspersed  pseudoseizures.  The nature of the events and their frequency are fairly  typical per the patient's wife who is at the bedside.  He has been on  numerous medications and this problem is chronically managed in [place].   PLAN:  I reassured the patient and his wife that his seizures do not  seem to be taking an unusual course here in the hospital.  We will  redistribute his Depakote dosing so that it is a little bit more even  throughout the day which may help in maintaining a more uniform plasma  level.  I did advise them that individual  seizure events will happen in  the hospital and that they not be treated as long as they are brief  (less than five minutes in duration), self-limited and his usual events.   Thank you for the consultation.  Michael L. Thad Ranger, M.D.  Electronically Signed     MLR/MEDQ  D:  03/17/2007  T:  03/17/2007  Job:  [id number]   cc:   Colleen Can. Deborah Chalk, M.D.

## 2011-03-07 NOTE — Cardiovascular Report (Signed)
[name], [name] NO.:  [id number]   MEDICAL RECORD NO.:  [id number]          PATIENT TYPE:  INP   LOCATION:  2901                         FACILITY:  [facility]   PHYSICIAN:  Colleen Can. Deborah Chalk, M.D.DATE OF BIRTH:  12-15-1950   DATE OF PROCEDURE:  03/12/2007  DATE OF DISCHARGE:                            CARDIAC CATHETERIZATION   PROCEDURE:  Left heart catheterization with selective coronary  angiography, left ventricular angiography, saphenous vein graft  angiography x2, angiography left internal mammary artery graft.   TYPE AND SITE OF ENTRY:  Percutaneous right femoral artery with  AngioSeal.   CATHETERS:  6-French four curved Judkins right and left coronary  catheters, 6-French pigtail ventriculographic catheter, left internal  mammary graft catheter.   CONTRAST MATERIAL:  Omnipaque.   MEDICATIONS GIVEN PRIOR TO PROCEDURE:  Valium 2 mg p.o.   MEDICATIONS GIVEN DURING PROCEDURE:  Versed 2 mg IV.   COMMENT:  The patient tolerated the procedure well.   HEMODYNAMIC DATA:  The aortic pressure was 126/79, LV was 135/22-37.  There was no aortic valve gradient noted on pullback.   ANGIOGRAPHIC DATA:  The left ventricular angiogram was performed in the  RAO position.  The overall cardiac size was increased.  Global ejection  fraction was in the 15-20% range.  There was very mild mitral  regurgitation.  There was generalized global hypokinesia.  The apex was  akinetic.   ANGIOGRAPHIC FINDINGS:  1. The left main coronary artery had a 20% narrowing.  2. The left anterior descending had a 70-90% ostial narrowing.  It was      totally occluded after the first septal perforating branch.  The      septal perforating branch supplied collaterals to the distal right      coronary artery.  3. Left circumflex.  Left circumflex had a totally occluded obtuse      marginal.  There were irregularities in the body of the left      circumflex as it continued to  posterolateral branch.  This supplied      collaterals to the distal right coronary artery.  4. Right coronary artery.  The right coronary artery is totally      occluded proximally.  5. Saphenous vein graft to the diagonal and left anterior descending.      There is an ostial 80% narrowing in the saphenous vein graft.  Then      in the distal mid portion of the graft, there is a very hazy 80%      narrowing with what appears to be a filling defect.  This would      appear to be the culprit vessel currently.  The flow only goes to      the diagonal vessel.  The section going to the anterior descending      is totally occluded.  There is no flow in the left anterior      descending antegrade.  There is some very slight retrograde flow in      the left anterior descending at the apex.  6. The left internal  mammary graft to the LAD is patent with a nice      insertion and good distal runoff.  7. Saphenous vein graft in the right coronary artery distribution to      the acute marginal, posterior descending and posterior lateral      branches showed diffuse disease throughout its course.  It was      patent to the acute margin and patent to the posterolateral branch      but occluded to the posterior descending.  Flow was slow, but      complete to the open branches.   OVERALL IMPRESSION:  1. Severe left ventricular dysfunction with elevated left ventricular      filling pressures.  2. Occluded native circulation.  3. Occluded saphenous vein graft to the posterior descending and to      the left anterior descending with patent saphenous vein graft to      the acute margin and posterolateral branches, patent but severely      diseased saphenous vein graft to the diagonal vessel.  4. Patent left internal mammary graft to the left anterior descending.   PLAN:  I think the options are really limited.  I think we need to treat  the congestive heart failure medically.  He has elevated filling   pressures.  I think after we tune him up completely, we could consider  him a candidate for very high risk intervention on the saphenous vein  graft.  I do not think he is a candidate for redo operation or for  cardiac transplant given his other general medical conditions.  In  particular it should be noted that he has seizure disorder with 10-20  seizures per month.      Colleen Can. Deborah Chalk, M.D.  Electronically Signed     SNT/MEDQ  D:  03/12/2007  T:  03/12/2007  Job:  [id number]

## 2011-03-10 NOTE — H&P (Signed)
NAMEHANSEN, CARINO                  ACCOUNT NO.:  [id number]   MEDICAL RECORD NO.:  [id number]          PATIENT TYPE:  INP   LOCATION:  2017                         FACILITY:  [facility]   PHYSICIAN:  Kerby Nora, MD        DATE OF BIRTH:  May 20, 1951   DATE OF ADMISSION:  08/23/2004  DATE OF DISCHARGE:                                HISTORY & PHYSICAL   PRIMARY CARE PHYSICIAN:  Nani Gasser, M.D.   CHIEF COMPLAINT:  Chest pain.   HISTORY OF PRESENT ILLNESS:  Mr. 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  He presented to the emergency room with chest pain  10/10, substernal, radiating to left shoulder, and left neck, which came at  rest along with nausea and mild shortness of breath.  At home, the patient  got no relief with nitroglycerin x 3 and Gaviscon for indigestion.  The  patient reports no change in chest pain with eating, breathing, movement, or  palpation.  The patient reports that he has not been taking his Metoprolol,  Norvasc, or Niacin in over a year secondary to expense.  Mr. Gavia has an  extensive history of frequent cardiac catheterization with the last one in  February of 2004 where no intervention was taken.   In the emergency room, the patient received nitroglycerin drip, Morphine 4  mg, and IV fluids with mild improvement in his chest pain that temporarily  lasted.   REVIEW OF SYMPTOMS:  No fever, no chills, no weight gain.  A 20 pound weight  loss because of decrease p.o. intake over the summer.  Positive chest pain  as described in the HPI.  No palpitations.  Positive shortness of breath x 1  day.  No cough, no wheeze, no pleuritic pain.  No vomiting, diarrhea.  Positive nausea with chest pain.  No melena.  No hematochezia.  No abdominal  pain.  No rash.  The patient does have seizure history from head trauma.  No  numbness.  The patient reports chronic cramps in his calves.  The  patient  wears glasses.  No sore throat.  No congestion, no hematuria, no dysuria, no  obvious bleeding.   PAST MEDICAL HISTORY:  1.  Hypercholesterolemia.  2.  Tobacco abuse.  3.  Coronary artery disease.  4.  GERD without esophagitis.  5.  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  6.  Frequent chronic headache.  7.  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  8.  Last hospitalization was secondary to chest pain in February of 2004.   MEDICATIONS:  1.  Aspirin 325 mg p.o. q.d.  2.  Depakote 500 mg q.a.m., at 6 p.m., and q.h.s.  3.  Nitroglycerin sublingual p.r.n. chest pain.  4.  Prilosec q.d.  5.  Barbital 1000 mg p.r.n. headache, one dose on the day of admission.  6.  Please note the patient is not taking metoprolol, Niacin, and Norvasc as      directed for over a  year.   ALLERGIES:  No known drug allergies.   PAST SURGICAL HISTORY:  1.  Status post coronary artery bypass graft x 6 in December 1995.  2.  Cardiac catheterization in 2001.  In 2002, had angioplasty of LAD and in      2004 no intervention was done.   SOCIAL HISTORY:  The patient is married.  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  The patient  is now on disability.  He denies alcohol use, IV drug use but smokes one  pack of cigarettes per day.  He normally does his own ADLs at home.  He does  not get any exercise.   FAMILY HISTORY:  Mother with bone carcinoma.  Uncle with bone carcinoma.  No  coronary artery disease less than age 15.   PHYSICAL EXAMINATION:  VITAL SIGNS:  Blood pressure 139/83, heart rate 69 to  88, respirations 18.  Saturation 100% on room air.  GENERAL:  Overweight-appearing gentleman, relaxed and in no apparent  distress.  Alert and oriented x 4.  PSYCHIATRIC:  Normal judgment and in sight.  NEUROLOGICAL:  Slow speech chronically secondary to past head trauma.  Cranial nerves II through XII grossly intact.  Reflexes 2+.   HEENT:  PERRLA.  Extraocular movements intact.  No pallor of conjunctivae.  Nares patent.  Tympanic membranes clear.  Moist mucus membranes.  Oropharynx  without exudate.  NECK:  No neck masses, no thyromegaly, no lymphadenopathy.  LUNGS:  Decreased breath sounds bilateral at bases.  Crackles bilaterally at  bases.  Normal respiratory effort.  No wheezes or rhonchi.  CARDIOVASCULAR:  Regular rate and rhythm.  No murmurs, gallops, or rubs.  CHEST:  Nontender to palpation.  No edema peripherally.  MUSCULOSKELETAL:  strength is 5/5 throughout.  ABDOMEN:  Soft and nontender.  Normal active bowel sounds.  No  hepatosplenomegaly.  SKIN:  No rash.  RECTAL:  No stool in rectal vault.  Hemoccult negative.   LABORATORY DATA:  I-stat shows sodium 142, potassium 3.3, chloride 106, CO2  38.6, BUN 14, creatinine 1, glucose 99.  Hemoglobin 15, hematocrit 44.  Point of care enzymes negative x 3.   Chest x-Derrick shows cardiomegaly with no edema and no acute disease.  EKG  shows normal sinus rhythm, T-wave inversion in II, III, aVF, V1, and V2.  Possible Q-wave in III, normal axis.  In comparison to EKG on February 2004,  there is no significant change.   ASSESSMENT/PLAN:  75.  A 60 year old male with atypical chest pain:  Given the fact that the      patient is high risk with history of coronary artery bypass x 6, we will      bring him for his atypical chest pain and rule him out for myocardial      infarction with cardiac enzymes x 3 and repeat electrocardiogram.      Currently, I will treat him with heparin, nitroglycerin drip, Morphine,      aspirin, oxygen, and beta blocker.  We will work on risk factor      modification as below.  We will call the patient's cardiologist, Dr.      Colleen Can. Tennant in the morning to discuss whether inpatient or      outpatient workup would be best.  Other possible etiologies for atypical     chest pain are anxiety, gastroesophageal reflux disease, esophageal       spasm.  We will treat gastroesophageal reflux disease with Prilosec.      The patient does not currently appear anxious.  2.  Hypokalemia:  Unsure of etiology, possibly related to decrease p.o.      intake.  We will replete p.r.n.  3.  Hypercholesterolemia:  We will check fasting lipid profile and we will      likely have to restart statin therapy.  The patient's goal is less than      100 LDL.  4.  Tobacco abuse:  Smoking cessation counseling.  5.  Seizure disorder:  Continue home medicine Depakote.  6.  Code status:  Do not resuscitate and do not intubate.  7.  Deep vein thrombosis prophylaxis:  The patient is ambulatory and will      likely continue to be so.  The patient is on heparin at the moment.  If      he goes off of heparin, we can consider prophylaxis if he does not      remain ambulatory for some reason.       AB/MEDQ  D:  08/24/2004  T:  08/24/2004  Job:  [id number]   cc:   Nani Gasser, M.D.  [address] [place], [place] [address]  Fax: [phone]

## 2011-03-10 NOTE — Discharge Summary (Signed)
[name], [name]                  ACCOUNT NO.:  [id number]   MEDICAL RECORD NO.:  [id number]          PATIENT TYPE:  INP   LOCATION:  2017                         FACILITY:  [facility]   PHYSICIAN:  Kerby Nora, MD        DATE OF BIRTH:  06/23/1951   DATE OF ADMISSION:  08/23/2004  DATE OF DISCHARGE:  08/24/2004                                 DISCHARGE SUMMARY   FAMILY PRACTICE TEACHING SERVICE   PRIMARY CARE PHYSICIAN:  Nani Gasser, M.D.   DIAGNOSES:  Chest pain, rule out for myocardial infarction.   SECONDARY DIAGNOSES:  1.  Tobacco abuse.  2.  Gastroesophageal reflux disease.  3.  Seizure disorder.  4.  Hypercholesterolemia.   DISCHARGE MEDICATIONS:  1.  Depakote 500 mg p.o. q.a.m., 500 mg p.o. at noon daily, and 1000 mg      q.h.s. p.o.  2.  Aspirin 325 mg p.o. daily.  3.  Metoprolol 25 mg p.o. b.i.d.  4.  Norvasc 2.5 mg p.o. daily.  5.  Niacin 50 mg p.o. b.i.d.  6.  Nitroglycerin 0.4 mg sublingual p.r.n.  7.  Prilosec over-the-counter 40 mg p.o. daily.   CONSULTS:  Dr. Deborah Chalk from Cardiology.   PROCEDURES:  None.   HOSPITAL ADMISSION:  The patient is a 60 year old white male with a history  of MI back in 2001, status post CABG, also with a history of high  cholesterol and tobacco abuse.  He presented to the ER with a reported 10/10  chest pain that was substernal and radiating to the shoulder and left arm at  rest with some nausea and mild shortness of breath.  The patient attempted  to relieve his pain with nitroglycerin x3 and was unsuccessful.  He also  described the pain, that it was not exacerbated by exertion.  He denied pain  with eating, breathing, or any palpitations.  He also reported that he has  not been taking his metoprolol, Norvasc, or niacin for over a year.  Therefore, he was admitted on November 2 for rule out myocardial infarction.  He received three tests for cardiac enzymes, and all were negative.  Also,  all EKG findings were negative  for any possible MI.  No Q waves were noted  on the EKGs.  It also should be noted that the patient has a significant  past medical history for being admitted to the hospital with chest pain and  for rule out MI, all of which have been negative in the recent past.  Therefore, with consultation with Dr. Deborah Chalk, it decided that the patient,  given the fact that he has negative cardiac markers and negative EKGs for  any MI findings, should be allowed to be discharged on the evening of  August 24, 2004.  Dr. Deborah Chalk requested that the patient be put back on all  of his medications including his metoprolol, Norvasc and niacin, which was  done, and a followup appointment should be made for the patient to see Dr.  Deborah Chalk in two weeks.  The patient was instructed of this and given Dr.  Tennant's phone number to call and made an appointment in two weeks.   LABORATORY FINDINGS:  As stated earlier, cardiac markers x3 were negative.  Repeated EKGs were also negative for any abnormal findings associated with  MI.   PROBLEM LIST:  1.  Atypical chest pain, rule out myocardial infarction.  The patient was      effectively ruled out for myocardial infarction and should followup with      his cardiologist, Dr. Deborah Chalk in two weeks.  He was also given      prescription for his metoprolol, Norvasc and niacin for which he has      been noncompliant for over a year, and explained that these medications      are important for preventing any reoccurrence myocardial infarctions,      and he should immediately restart taking these medications.  He was also      given prescriptions for Prilosec to cover his GERD symptoms and also      given nitroglycerin for any recurrent episodes of chest pain.  2.  Tobacco abuse.  The patient receiving smoking cessation counseling,      though it is in the opinion of the team that the patient has no true      desire to quit smoking at this time.  3.  Hypercholesterolemia.   The patient was placed back on his niacin.  He      was also told of his most recent cholesterol findings, which were done      in March of this year, which included an LDL of 178 and HDL of 38.      Therefore, it was instructed to the patient that he should get back on      his niacin to increase his HDL cholesterol and lower his LDL      cholesterol.  4.  Seizure disorder secondary to trauma. The patient was kept on his      Depakote medication while in the hospital.  The patient reported he had      plenty of Depakote medication to continue taking after he was      discharged.   DISCHARGE INSTRUCTIONS:  The patient was told that he has no restrictions on  activity, and that he should maintain a heart-healthy, low-fat, low-  cholesterol diet.  Wound care is not applicable.  No special instructions  were given to the patient.  Finally, the patient was given Dr. Ronnald Nian  phone number at [phone] and told to make a followup with Dr. Deborah Chalk, his  cardiologist, in two weeks.   No procedures were done on this patient during his hospital stay.       AB/MEDQ  D:  08/24/2004  T:  08/24/2004  Job:  [id number]

## 2011-03-10 NOTE — H&P (Signed)
[place]. [facility]  Patient:    [name], [name] Visit Number: [id number] MRN: [id number]          Service Type: MED Location: [facility] [id number] Attending Physician:  Eleanora Neighbor Dictated by:   Darci Needle, M.D. Admit Date:  01/27/2002 Discharge Date: 01/29/2002   CC:         Colleen Can. Deborah Chalk, M.D.  Dr. Shane Crutch, [facility], [place], [place]   History and Physical  REASON FOR ADMISSION:  Chest pain.  HISTORY OF PRESENT ILLNESS:  The patient is 60 years of age and has a history of coronary artery disease.  He is status post coronary artery bypass grafting in 1995 and since that time has had multiple hospital admissions for chest pain and has also had multiple percutaneous coronary interventions.  His most recent PCI was on the saphenous vein graft to the right coronary in September 2002.  He is also known to have LAD/left main stent based on the reports in the chart.  This morning the patient awakened at approximately 4 a.m. with chest pain.  It was unrelieved by nitroglycerin, and he came to the emergency room.  He continues to have chest discomfort although in the absence of EKG changes.  SIGNIFICANT MEDICAL PROBLEMS: 1. Atherosclerotic heart disease as outlined above. 2. Hypercholesterolemia. 3. History of traumatic brain injury in 1991. 4. History of seizure disorder. 5. Gastroesophageal reflux disease.  HABITS:  Smokes cigarettes.  Denies ethanol consumption.  ALLERGIES:  None.  MEDICATIONS:  Current medical therapy includes Prilosec 20 mg b.i.d., Zocor 20 mg per day (not taking), Depakote 500 mg t.i.d., aspirin one per day.  FAMILY HISTORY:  Please refer to prior records.  SOCIAL HISTORY:  Please refer to prior records.  REVIEW OF SYSTEMS:  Unremarkable other than as noted above.  PHYSICAL EXAMINATION:  VITAL SIGNS:  The patients blood pressure is 142/70, heart rate is 70. Respirations are 16.  GENERAL:  He is  sitting on the edge of the hospital gurney.  He is in no acute distress.  SKIN:  Clear.  No cyanosis is noted.  HEENT:  Pupils are equal and reactive.  Extraocular movements are full.  No xanthelasma.  NECK:  No JVD, carotid bruits, or adenopathy.  CHEST:  Lungs clear.  CARDIAC:  S4 gallop, otherwise unremarkable.  ABDOMEN:  Soft.  Liver edge not palpable.  EXTREMITIES:  No edema.  Pulses are 2+ and symmetric in upper and lower extremities.  NEUROLOGIC:  Decreased affect, otherwise no obvious focal deficits.  DIAGNOSTIC STUDIES:  EKG reveals nonspecific inferior and anterolateral T-wave changes.  Small Q-waves are noted.  The initial CK is 124 with an MB of 3.4, a troponin I of 0.04.  Potassium is 3.8, BUN and creatinine are 10 and 1.0.  Hemoglobin is 15.3.  ASSESSMENT:  Chest pain, etiology uncertain.  With the patients long history of coronary atherosclerosis and multiple percutaneous interventions, clearly myocardial ischemia is the etiology to be entertained.  PLAN: 1. Admit to rule out myocardial infarction with enzymes. 2. Further evaluation by Dr. Deborah Chalk. 3. IV nitroglycerin and IV heparin. Dictated by:   Darci Needle, M.D. Attending Physician:  Eleanora Neighbor DD:  01/27/02 TD:  01/27/02 Job: [id number]

## 2011-03-10 NOTE — Discharge Summary (Signed)
[name]                  ACCOUNT NO.:  [id number]   MEDICAL RECORD NO.:  [id number]          PATIENT TYPE:  INP   LOCATION:  3729                         FACILITY:  [facility]   PHYSICIAN:  Leighton Roach McDiarmid, M.D.DATE OF BIRTH:  [birth year]   DATE OF ADMISSION:  08/17/2006  DATE OF DISCHARGE:  08/18/2006                                 DISCHARGE SUMMARY   DISCHARGE DIAGNOSES:  1. Typical chest pain, resolved, no evidence of myocardial infarction.  2. Transient speech changes, resolved.  3. Hypercholesterolemia.  4. Tobacco abuse.  5. Coronary artery disease.  6. Gastroesophageal reflux.  7. History of seizures.  8. Status post coronary artery bypass graft x6 in December of 1995.   DISCHARGE MEDICATIONS:  1. Aspirin 325 mg p.o. daily.  2. Depakote 500 mg p.o. t.i.d.  3. Metoprolol 25 mg p.o. twice daily.  4. Niacin 100 mg p.o. twice daily.  5. Prilosec 20 mg p.o. twice daily.  6. Fioricet 1-2 tablets every four hours as needed for headache.   BRIEF HOSPITAL ADMISSION COURSE:  [name] is a 60 year old white male who is  status post CABG x6 in 1995 who presented to his primary care physician, Dr.  Raechel Ache on August 17, 2006 complaining of an episode of chest pain,  shortness of breath and diaphoresis similar to previous symptoms that  preceded cardiac interventions several years ago.  Pain had last 4-5 days.  The patient also had associated speech changes that were transient.  No  focal weakness reported.   PROCEDURES:  1. Chest x-Abee on August 17, 2006 with no acute abnormalities.  2. Carotid Dopplers August 17, 2006 with no RCA stenosis bilaterally,      right vertebral artery occluded, left vertebral artery flow antegrade.  3. Echocardiogram August 17, 2006 with left ventricular systolic function      normal.  EF estimated to be 55%, still inadequate for evaluation of      left ventricular regional wall motions, left ventricular wall thickness      mildly  increased, mild mitral anular calcification.   HOSPITAL COURSE:  1. Chest pain.  The patient was admitted on telemetry.  No changes during      duration on telemetry.  Only significant for sinus bradycardia to the      58-range.  No changes on the EKG as well as no elevation of cardiac      enzymes.  Therefore, cardiac etiology of chest pain ruled out.  Working      diagnosis likely gastroesophageal reflux versus costochondritis.  The      patient did have reproducible chest pain on exam on the last day of      admission.  He was started on Tylenol p.r.n.  The patient's vital signs      remained stable as well as stable off of supplemental oxygen.  We will      send the patient home on Tylenol p.r.n. as well as Prilosec twice daily      for possible etiologies of chest pain.  The patient was given red flags  before discharge and he will follow up with Dr. Raechel Ache within one      week of his discharge date for follow-up.  2. Speech changes.  The patient did not have any observable speech changes      during admission.  There were no neurological deficits and considering      no stenosis in carotid arteries, CVA highly unlikely.  This may be      associated with his history of seizures.  However, his Depakote level      was within normal limits at his admission.  No evidence of seizure      throughout his entire admission.  The patient will be sent home on his      current dosage of valproic acid.  He was given red flags to return to      the ED and he will be discharged in stable condition.   DISPOSITION:  Home.  Stable and improved.   FOLLOWUP:  The patient will call [facility] on Monday,  August 20, 2006 for an office visit to be scheduled within one week of his  discharge date with Dr. Raechel Ache.      Morley Kos, M.D.    ______________________________  Leighton Roach McDiarmid, M.D.    VRE/MEDQ  D:  08/18/2006  T:  08/18/2006  Job:   [id number]   cc:   [facility]

## 2011-03-10 NOTE — H&P (Signed)
[name], [name]                  ACCOUNT NO.:  [id number]   MEDICAL RECORD NO.:  [id number]          PATIENT TYPE:  INP   LOCATION:  3729                         FACILITY:  [facility]   PHYSICIAN:  Leighton Roach McDiarmid, M.D.DATE OF BIRTH:  [birth year]   DATE OF ADMISSION:  08/17/2006  DATE OF DISCHARGE:  08/18/2006                                HISTORY & PHYSICAL   CHIEF COMPLAINT:  Chest pain.   HISTORY OF PRESENT ILLNESS:  Patient is a 60 year old male with a  complicated past medical history noted for coronary artery disease status  post CABG with episodic chest pain, shortness of breath, and diaphoresis.  This is similar to previous symptoms that preceded his cardiac intervention  several years ago.  During the past 4 to 5 days, he has had these  intermittent episodes that last about 15 minutes.  They self-resolve.  They  are atypical from his previous GERD symptoms.  He also has a history of a  traumatic brain injury and a seizure disorder.  This complicates the fact  that he is also giving a history of episodic speech changes that are  occurring at the same time as the chest pain, shortness of breath, and  diaphoresis.  He has no focal weakness during these episodes but he says, I  feel weak all over when it happens.  He is chest pain free right now.  He  has no shortness of breath now.   REVIEW OF SYSTEMS:  No fevers, no chills.  CARDIOVASCULAR:  As above.  PULMONARY:  As above.  GI:  No bright red blood per rectum.  HEENT:  No  changes in vision.  SKIN:  No rash.   PROBLEM LIST:  History of hypercholesterolemia, smoking, arthrosclerosis of  the coronary arteries, gastroesophageal reflux disease, convulsions after a  traumatic brain injury, back pain, erectile dysfunction.   Family history is noted for a mother with bone cancer and his uncle on his  father's side also had bone cancer.   SOCIAL HISTORY:  He is married.  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  He is on  disability now.  He has smoked extensively.  He does not  use any illicit drugs.   MEDICATIONS:  Medications include:  1. Aspirin 325 mg p.o. daily.  2. Depakote 500 mg p.o. t.i.d.  3. Fioricet for headaches 4 p.o. per day p.r.n. pain.  4. Metoprolol 25 mg p.o. b.i.d.  5. Neurontin.  This has actually been discontinued.  The patient was not      able to tolerate this due to GI upset.  6. Niacin 100 mg p.o. b.i.d.  7. Nitroglycerin 0.4 mg sublingual p.r.n.  8. Prilosec 20 mg p.o. b.i.d.   ALLERGIES:  NO KNOWN DRUG ALLERGIES BUT HE IS INTOLERANT TO NEURONTIN.   OTHER PROCEDURES:  Patient had his CABG back in 1995.  His last cardiac cath  was in 2004.  He has been seen by Dr. Deborah Chalk.   PHYSICAL EXAM:  Afebrile.  Blood pressure 134/81.  Pulse 85.  Weight 209  pounds.  GENERAL APPEARANCE:  He is in no apparent distress.  He is alert and  oriented.  He is at his baseline mentation.  He has no changes in his  baseline speech.  His head is normocephalic, atraumatic.  Mucous membranes are moist.  Extraocular movements are intact.  Funduscopic exam:  It is difficult to  visualize the fundus.  Mouth:  No oropharyngeal erythema.  CHEST:  Clear to auscultation bilaterally.  No wheezes.  HEART:  Regular rate and rhythm.  ABDOMEN:  Soft, nontender, nondistended.  Positive bowel sounds.  EXTREMITIES:  No edema.  Pulses 2+ dorsalis pedis pulses and radial pulses.  I detect no carotid bruit.  RECTAL EXAM:  Deferred.  NEUROLOGIC:  He has normal Romberg.  His cerebellar function is noted for  mild intention tremor with finger-to-nose testing in the bilateral hands.  He has normal heel-to-shin on rapid alternating movement.  His cranial  nerves are intact bilaterally.  Strength is within normal limits in the  bilateral upper and lower extremities.  He has 2+ deep tendon reflexes in  the bilateral upper and lower extremities.  His gait is at his baseline.  It  is not unsteady.  SKIN:  He has no  rash.  Lymphadenopathy not appreciated.   LABORATORY DATA:  EKG showing flipped T waves in II, II and AVF along with  V5 and V6.  These were old findings.  He had no acute ST changes.  Otherwise, EKG was normal sinus rhythm.   ASSESSMENT AND PLAN:  Patient is a 60 year old male with the following  problems:  1. Chest pain.  Will admit to rule out MI.  I discussed with this with Dr.      Leitha Schuller and Dr. Cherly Hensen.  We will get serial enzymes and EKGs.  Check a      CBC, a C-met, and chest x-Fulgham.  He will need to follow up with      Cardiology at the discretion of the [facility].      He is chest pain free for now.  He is stable for transport.  His      cardiac causes are highly differential.  These are noted to be      different symptoms from his previous reflux.  2. Will put him in for a __________.  Continue his beta blocker and his      niacin and his aspirin.  3. Speech changes.  Given his baseline right now, now as far as his neuro      status is concerned, we will check a head CT, TEE and carotid Doppler      for TIA workup.  He is already on aspirin.  4. GERD.  PPI.  5. Seizure disorder.  Depakote level.  Continue home meds.  6. Heart-healthy diet.  7. Follow up with the [facility], Dr. Ludwig Clarks and      Dr. Dillard Essex.      Dwana Curd Para March, M.D.    ______________________________  Leighton Roach McDiarmid, M.D.    GSD/MEDQ  D:  08/17/2006  T:  08/18/2006  Job:  [id number]

## 2011-03-10 NOTE — Cardiovascular Report (Signed)
NAME:  [name], [name]                            ACCOUNT NO.:  [id number]   MEDICAL RECORD NO.:  [id number]                   PATIENT TYPE:  INP   LOCATION:  2927                                 FACILITY:  [facility]   PHYSICIAN:  Colleen Can. Deborah Chalk, M.D.            DATE OF BIRTH:  [birth year]   DATE OF PROCEDURE:  12/01/2002  DATE OF DISCHARGE:                              CARDIAC CATHETERIZATION   HISTORY:  The patient is referred for repeat catheterization after he  presented with substernal chest pain.  He had a stent to his left main  coronary artery approximately six months ago.  He has had previous coronary  artery bypass grafting.   PROCEDURES:  Left heart catheterization with selective coronary angiography,  left ventriculography, saphenous vein graft angiography x2, and angiography  of the left internal mammary artery.   TYPE AND SITE OF ENTRY:  Percutaneous right femoral artery.   CATHETERS:  Six French 4 curved Judkins right and left coronary catheters, 6  [place] no-torque right coronary catheter for the right coronary artery  bypass graft, internal mammary graft catheter for the internal mammary  artery.   MEDICATIONS GIVEN PRIOR TO PROCEDURE:  Valium 10 mg p.o.   MEDICATIONS GIVEN DURING PROCEDURE:  Versed 2 mg IV.   CONTRAST:  Pure Omnipaque.   COMMENTS:  The patient tolerated the procedure well.   HEMODYNAMIC DATA:  The aortic pressure was 120/68, LV was 109/9-13.  There  was no aortic valve gradient noted on pullback.   ANGIOGRAPHIC DATA:  1. Left main coronary artery had a distal 20% narrowing.  2. Left anterior descending was totally occluded after a large septal     perforator.  The septal perforator gave collateral branches inferiorly.  3. Left circumflex:  The left circumflex has one obtuse marginal that is     totally occluded.  There is a smaller obtuse marginal, a continuation to     the posterolateral wall, that also gives collaterals to the distal  right     coronary artery.  It gives collaterals to the posterior descending     vessel.  4. Right coronary artery:  The right coronary artery is totally occluded     approximately 5-7 cm from its ostium.  5. Saphenous vein graft in sequential fashion to the diagonal and left     anterior descending is patent.  The distal left anterior descending is     diffusely diseased with poor distal runoff.  This is not a new finding.  6. Saphenous vein graft to the acute marginal, posterior descending, and     posterolateral branch:  Has diffuse disease throughout its course.  It is     patent and gives flow to the acute marginal and to the posterolateral,     but it is occluded with the posterior descending vessel.  There is     somewhat slow flow to  this vessel and, quite frankly, the distal runoff     into these vessels is diffusely diseased.  7. Left internal mammary artery to the obtuse marginal was widely patent     with a nice insertion and good distal runoff.   Left ventricular angiogram:  Left ventricular angiogram was performed in the  RAO position.  Overall cardiac size is normal.  There is a discrete area of  inferior akinesia.  There is mild anterior hypokinesia.  The global ejection  fraction would be estimated to be in the 50-55% range.  There is no mitral  regurgitation, intracardiac calcification, or intracavitary filling defect.   OVERALL IMPRESSION:  1. Inferior akinesis with minimal anterior hypokinesia.  2. Totally occluded right coronary artery, totally occluded left circumflex,     totally occluded left anterior descending.  3. Patent stent in the left main coronary artery.  4. Patent saphenous vein graft to the acute marginal and posterolateral     branch with an occlusion to the posterior descending vessel, patent     sequential saphenous vein graft to the diagonal and left anterior     descending, and patent left internal mammary graft to the obtuse marginal      branch.   DISCUSSION:  In light of these findings and with the collateral flow, I  think that the patient can be managed medically.                                               Colleen Can. Deborah Chalk, M.D.    SNT/MEDQ  D:  12/01/2002  T:  12/01/2002  Job:  [id number]

## 2011-03-10 NOTE — Discharge Summary (Signed)
[place]  Patient:    [name], [name]                           MRN: [id number] Adm. Date:  03/12/01 Disc. Date: 03/13/01 Attending:  Santiago Bumpers. Leveda Anna, M.D. Dictator:   Zella Ball, M.D. CC:         Amparo Bristol, M.D.             Colleen Can. Deborah Chalk, M.D.                           Discharge Summary  DATE OF BIRTH:  [birth year]  DISCHARGE DIAGNOSES: 1. Chest pain, not otherwise specified. 2. Coronary artery disease with prior coronary artery bypass graft x6    in six vessel in 1995.  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 3. Seizure disorder. 4. Gastroesophageal reflux disease. 5. Tobacco abuse.  DISCHARGE MEDICATIONS: 1. Propranolol 60 mg p.o. q.d. 2. Aspirin 81 mg p.o. q.d. 3. Zocor 20 mg p.o. q.d. 4. Lorazepam 1 mg p.o. t.i.d. 5. Depakote 500 mg p.o. t.i.d. 6. Celebrex 100 mg p.o. t.i.d. 7. Prilosec 20 mg p.o. b.i.d. 8. Isosorbide dinitrate SR 80 mg p.o. b.i.d. 9. Nitroglycerin 0.4 mg sublingual p.r.n. chest pain.  DIET:  The patient is to follow a low fat diet.  SPECIAL INSTRUCTIONS:  The patient was instructed to stop smoking.  FOLLOWUP:  The patient is to follow up with: 1. Dr. Peyton Najjar with [facility] on Monday, May 27 at 8:40 a.m. 2. Dr. Deborah Chalk, cardiology, Friday, June 7 at 12:15 p.m. 3. The patient was also to keep his regularly scheduled appointments    with his neurologist.  CONSULTS WHILE IN HOSPITAL:  None.  PROCEDURES WHILE IN HOSPITAL:  None.  HISTORY OF PRESENT ILLNESS:  This is a 60 year old male with a history of extensive cardiovascular disease, who presented to the emergency room after sudden onset of chest pain while waiting for his regular appointment at the [facility] with Dr. Peyton Najjar.  He had an ECG at the clinic and was started on a nitroglycerin drip and then transferred over to the [facility] ED.  Apparently, the ECG was normal, and the  nitroglycerin actually did not help his chest pain which radiated both to his left chin and shoulder.  He did not have any palpitations with this.  No nausea.  No chills.  No weakness.  The patients recent history is significant for a subendocardial MI in March of 2001 at which time he underwent catheterization which did not show any lesions that were amenable to surgical correction.  He appeared to have diffuse small vessel disease.  Physical examination on presentation, afebrile at 97.4, respiratory rate 20, pulse 78, blood pressure 115/88.  His physical examination was unremarkable. A rectal check was performed which was Hemoccult negative.  LABORATORY DATA:  Initial laboratory tests, the patient had a white count of 8.3, hemoglobin of 15.3, platelets of 259.  PTT of 31 seconds, PT 28.8, INR of 1.  Initial troponin was 0.02, CK of 99, and a CK-MB of 2.2.  ECG in the emergency room was without significant ST elevations or depressions.  Assessment and plan at that time:  Given the patients significant cardiac history, it was decided to admit him to the telemetry unit and rule him out for myocardial infarction.  HOSPITAL COURSE:  The  patient had cardiac enzymes performed, CK-MB x3 and troponin x2, which were all negative for cardiac injury.  He was monitored on telemetry during this time period and he had no events.  Dr. Angelina Pih office was contacted and spoke with his PA, who did not feel that cardiology needed to see him during this hospitalization but would like to have him followup as an outpatient.  Of note, they stated that the patient had not followed up with his appointments as an outpatient in the past.  Prior to discharge, this was emphasized to the patient the severity of his cardiac condition and the fact that he really did need to follow up with cardiology given his very extensive disease.  A special note with this patient is the fact that he has had trouble  obtaining medications in the past because apparently he has some disability from a head injury, which he gets workmens compensation for.  However, workmens compensation will not pay for his cardiac medicines, only for his seizure medicines, so he is in a bit of a rock and a hard place as far as getting cardiac care, or at least cardiac medications.  Therefore, Dr. Peyton Najjar has, on his last outpatient visit, wrote to one of the drug companies to help the patient with some of his medications and that application is pending at the time of discharge.  In the meanwhile, we gave the patient as much as we could of the Zocor to get him through until when his application comes through. DD:  03/13/01 TD:  03/14/01 Job: [id number]

## 2011-03-10 NOTE — Discharge Summary (Signed)
[place]. [facility]  Patient:    [name], [name] Visit Number: [id number] MRN: [id number]          Service Type: MED Location: [phone] Attending Physician:  Eleanora Neighbor Dictated by:   Jennet Maduro Earl Gala, R.N., A.N.P. Admit Date:  07/19/2001 Discharge Date: 07/25/2001   CC:         Dr. Shane Crutch, [facility]   Discharge Summary  PRIMARY DISCHARGE DIAGNOSIS:  Recurrent chest pain with slight elevation in cardiac enzymes consistent with a non-Q-wave myocardial infarction with elective coronary angiography and subsequent angioplasty to the saphenous vein graft to the right coronary artery.  SECONDARY DISCHARGE DIAGNOSES: 1. Arteriosclerotic cardiovascular disease with previous coronary artery    bypass grafting in 1995.  He has a chronic chest pain syndrome and has    been admitted multiple times with recurrent episodes of intractable    angina.  His last cardiac catheterization had been in August of 2002 at    which time his bypass grafts were noted to be patent yet with severe    distal disease and after much debate it was elected to proceed on with    stenting of the left anterior descending as well as the left main coronary    artery. 2. Medical noncompliance. 3. Ongoing tobacco abuse. 4. Hypercholesterolemia. 5. 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 6. Gastroesophageal reflux disease.  HISTORY OF PRESENT ILLNESS:  [name] is a 60 year old male who has known coronary disease.  He also has known medical noncompliance.  He presents back to the hospital at the end of September with recurrent bouts of chest pain. He was subsequently seen in the emergency room and admitted for further evaluation.  Please see the dictated history and physical for further patient presentation and profile.  LABORATORY DATA ON ADMISSION:  First CK and troponin were negative.  EKG showed normal sinus rhythm with no acute changes.  Valproic acid  was normal at 79.4.  Hematocrit was 42 and white count was 8000.  Chest x-Kassis showed bibasilar submental atelectasis.  HOSPITAL COURSE:  The patient was admitted to telemetry.  He was placed on IV nitroglycerin as well as IV heparin.  The remainder of his cardiac enzymes were noted to be slightly elevated with a peak MB of 6.4.  His troponin peaked at 0.43.  He preceded on with repeat coronary angiography on July 22, 2001, which revealed mild inferior hypokinesis with well-preserved global LV function, totally occluded right coronary, LAD, and left circumflex, renarrowing in the left main coronary artery with approximately 50% distal to the stent, patent saphenous vein graft to the distal right with severe stenosis before the posterolateral branch, patent saphenous vein graft to the first diagonal and LAD, and patent left internal mammary artery to the OM.  Post procedure, he was transferred back to his room.  We allowed 24 to 36 hours in order to clear x-Antosh contrast.  After review of the cineangiograms, we decided to proceed on with attempt at percutaneous coronary intervention to the distal saphenous vein graft to the right coronary artery.  This procedure was performed on July 24, 2001.  A high torque floppy guidewire was passed across the lesion but it was quite tight and very difficult in order to get the guidewire to pass.  Using the 2.0 Maverick balloon, which was inflated to a maximum of 12 atmospheres, overall satisfactory result was obtained.  It was not felt that stenting was really appropriate.  The final angiographic result was felt to be excellent with no residual LIMA stenosis.  At that point, he was transferred back to telemetry. He has done well throughout the remainder of his hospitalization.  He has had no recurrent chest pain.  He is currently back on his medical regimen and is felt to be a stable candidate for discharge today.  DISCHARGE CONDITION:   Improved.  DISCHARGE MEDICATIONS:  He will resume all of his previous home medications which include aspirin daily, Depakote as before, Prilosec as before.  We will continue Zocor 20 mg a day, Toprol XL 50 mg a day, and Plavix 75 mg for the next 21 days.  ACTIVITY:  As tolerated.  DISCHARGE INSTRUCTIONS:  He is to put an ice pack to the groin as needed.  FOLLOW-UP:  We will have him follow up in the office in approximately two weeks and he is asked to call to schedule that appointment. Dictated by:   Jennet Maduro Earl Gala, R.N., A.N.P. Attending Physician:  Eleanora Neighbor DD:  07/25/01 TD:  07/25/01 Job: [id number]

## 2011-03-10 NOTE — Discharge Summary (Signed)
. [facility]  Patient:    [name], [name] Visit Number: [id number] MRN: [id number]          Service Type: MED Location: CCUB [id number] Attending Physician:  Eleanora Neighbor Dictated by:   Jennet Maduro Earl Gala, R.N., A.N.P. Admit Date:  01/27/2002 Discharge Date: 01/29/2002   CC:         Dr. Shane Crutch, [facility]   Discharge Summary  PRIMARY DISCHARGE DIAGNOSES: Chest pain consistent with subendocardial myocardial infarction with repeat coronary angiography and subsequent angioplasty of the left main coronary artery/left anterior descending/left circumflex with a 3.5 x 6 mm cutting balloon.  SECONDARY DISCHARGE DIAGNOSES: 1. Extensive coronary atherosclerosis with previous coronary artery bypass    grafting in 1995 with multiple admissions and multiple percutaneous    coronary interventions. Cardiac catheterization this admission revealed a    100% occluded left anterior descending, saphenous vein graft to the    diagonal is unremarkable, the left circumflex is 100% occluded, the left    internal mammary to the obtuse marginal is unremarkable, the right    coronary is 100% occluded, the saphenous vein graft which is the site of a    previous angioplasty is unremarkable and left ventricular function    demonstrates mild inferior hypokinesis. 2. Ongoing tobacco abuse. 3. Medical noncompliance. 4. History of a traumatic brain injury in 1991 with subsequent seizure    disorder.  Current Depakote levels are low at 33. 5. Gastroesophageal reflux disease. 6. Hypercholesterolemia.  HISTORY OF PRESENT ILLNESS: The patient is a 60 year-old male who has known extensive coronary artery disease. He had ongoing medical noncompliance.  He presented to the hospital with recurrent chest pain at approximately 4 a.m. on the day of admission.  It was unrelieved by nitroglycerin.  He had no EKG changes.  His cardiac enzymes, however, were slightly elevated with  a peak MB of 43 and he was referred on for admission with plans for repeat cardiac catheterization.  LABORATORY DATA ON ADMISSION: Chemistries showed a sodium of 140, a potassium of 3.8, a chloride of 104, a C02 of 28, a BUN of 10, a creatinine of 1.0 and a glucose of 104.  Troponin I was 0.04.  First CK was negative. Hematocrit was 43, white count 10,000.  EKG had chronic changes. Valproic acid level was low at 33 with the range being at 50 to 100.  Peak MB was 43.  Chest x-Radoncic showed previous coronary artery bypass grafting and bibasilar atelectasis.  HOSPITAL COURSE: The patient was admitted. He was started on IV nitroglycerin and heparin.  Cardiac enzymes were noted to be positive consistent with a subendocardial myocardial infarction.  We proceeded on with cardiac catheterization the following day with results as stated above. An overall excellent result was obtained after angioplasty with a 3.5 x 6 mm cutting balloon to the left main coronary/left anterior descending/left circumflex. Today on January 29, 2002 he is doing well.  He initially had seizures at the beginning of his hospitalization.  He was given one extra dose of Depakote and he has had no recurrence.  Today on January 29, 2002 he is doing well without complaints.  Post-lab is satisfactory and he is felt to be a stable candidate for discharge today.  DISCHARGE MEDICATIONS: 1. Prilosec 20 mg two times a day. 2. Aspirin daily. 3. Imdur 60 mg a day. 4. Zocor 20 mg a day. 5. Depakote as he was taking before. 6. Plavix 75 mg a day  times 21 days. 7. Toprol XL 50 mg a day.  ACTIVITY:  Progressive.  SPECIAL INSTRUCTIONS: 1. He is asked to follow-up with Dr. Shane Crutch at [facility]    regarding Depakote levels and appropriate drug dosing. 2. He is to place an ice pack as needed to the groin. 3. He was strongly encouraged to stop smoking. 4. He was strongly encouraged to obtain all medicines and try to  maintain some    type of medical compliance. 5. We will ask him to be seen in our office in one week and he is to call to    schedule that appointment. Dictated by:   Jennet Maduro Earl Gala, R.N., A.N.P. Attending Physician:  Eleanora Neighbor DD:  01/29/02 TD:  01/29/02 Job: [id number]

## 2011-03-10 NOTE — Cardiovascular Report (Signed)
[place]. [facility]  Patient:    [name], [name]                         MRN: [id number] Proc. Date: 06/03/01 Adm. Date:  [id number] Attending:  Garnette Scheuermann CC:         Sibyl Parr. Darrick Penna, M.D.   Cardiac Catheterization  HISTORY:  Mr. Decoteau is admitted with prolonged and recurrent chest pain.  He has had multiple catheterizations over a several year period relating to prolonged chest pain.  He has had coronary artery bypass grafting.  PROCEDURE:  Left heart catheterization with selective coronary angiography, left ventricular angiography, angiography of the left internal mammary artery, angiography of saphenous vein graft x 2, and stent placement in the left main coronary artery and stent placement in the left anterior descending.  CARDIOLOGIST:  Colleen Can. Deborah Chalk, M.D.  TYPE AND SITE OF ENTRY:  Percutaneous right femoral artery.  CATHETERS:  6-French 4 curved Judkins right and left coronary catheters, 6-French pigtail ventriculography catheter, a no-torque right coronary artery catheter (for right saphenous vein graft), and left internal mammary artery catheter, FL4 7-French guide catheter with high-torque floppy guidewire. Initially a 2.5 x 15 mm Maverick balloon, subsequently returning with a 3.0 x 8 mm Penta stent for the left anterior descending, an 8 mm x 3.5 Penta stent for the left main coronary artery.  MEDICATIONS GIVEN DURING PROCEDURE:  Heparin 5000 units IV, Versed 1 mg IV, and fentanyl 25 mcg IV.  COMMENTS:  The patient tolerated the procedure well.  HEMODYNAMIC DATA:  The aortic pressure was 116/61.  LV was 121/12.  There was no aortic valve gradient noted on pullback.  ANGIOGRAPHIC DATA:  LEFT VENTRICULAR ANGIOGRAM:  The left ventricular angiogram was performed in the RAO position.  Overall cardiac size and silhouette were normal.  Global ejection fraction was 55 to 60%.  There was very mild distal inferoapical hypokinesis.   There was no mitral regurgitation, intracardiac calcification, or intracavitary filling defect.  CORONARY ARTERIES:  The coronary arteries arise and distribute normally.  1. Left main coronary artery has a 90% distal stenosis. 2. Left anterior descending: The left anterior descending has a 99% stenosis    before a large septal perforating branch.  It is totally occluded    thereafter. 3. Left circumflex: The left circumflex is occluded at the level of the    marginal vessel.  There is a reasonably large continuation branch in the    A-V groove and large atrial circumflex branch. 4. Right coronary artery:  The right coronary artery is totally occluded    proximally. 5. Saphenous vein graft to the right coronary artery is sequentially grafted    to three distal branches.  All of these are patent with satisfactory distal    runoff.  There is diffuse distal disease present.  However, flow seems to    be satisfactory. 6. Left internal mammary artery to the obtuse marginal is widely patent with    a nice insertion and good distal runoff. 7. Sequential saphenous vein graft to diagonal and left anterior descending    is patent with satisfactory insertion and nice smooth body of the graft.    Distal left anterior descending is diffusely diseased toward the apex with    scanty irregular flow and could be an ongoing source of angina.  ANGIOPLASTY PROCEDURE:  After considerable debate, we elected to proceed on with angioplasty and stenting of the  proximal left anterior descending as well as left main coronary artery.  We initially passed a guidewire across the left main stenosis and further into the left anterior descending across the stenosis approximately one-third of the way down the left anterior descending. Using a 2.5 x 15 mm balloon, the left main coronary artery as well as the left anterior descending was dilated.  We then placed a 3.0 x 8 mm Penta stent in the left anterior descending  with excellent result.  There was good distal flow into the septal perforating branches, and no vessel was occluded.  We then returned to the left main coronary artery.  A 3.5 x 8 mm Penta stent was positioned and inflated to a maximum of 10 atmospheres.  There were two different inflations.  It was felt we had an excellent result.  Although there was still some mild tapering of the stented segment, we did not lose the left anterior descending or the proximal left circumflex.  The findings were reviewed in several angiographic projections, and there was felt to be a satisfactory result in the left main coronary artery with satisfactory dilatation of the stent but a residual 20 to 30% taper from the more proximal portion, but it tapered into the left anterior descending and left circumflex in a nice smooth fashion.  OVERALL IMPRESSION: 1. Mild inferoapical hypokinesis with well preserved left ventricular    function. 2. Totally occluded right coronary artery, left circumflex, and left anterior    descending with a 90% left main stenosis. 3. Patent saphenous vein grafts x 3 to the right coronary artery and patent    saphenous vein graft sequentially to the diagonal and left anterior    descending with patent left internal mammary artery graft to the obtuse    marginal. 4. Successful stent placement in the left anterior descending and successful    stent placement in the left main coronary artery. DD:  06/03/01 TD:  06/03/01 Job: [id number]

## 2011-03-10 NOTE — Discharge Summary (Signed)
[place]. [facility]  Patient:    [name], [name]                         MRN: [id number] Adm. Date:  [id number] Attending:  Sanjuana Letters Dictator:   Lyndee Leo. Foreman                           Discharge Summary  CHIEF COMPLAINT:  Chest pain.  HISTORY OF PRESENT ILLNESS:  The patient is a 60 year old white male with known  coronary artery disease, status post CABG in 1995, who reported onset of chest ain at 4 a.m. on December 27, 1999.  He stated the chest pain was 8 out of 10 and the patient was asleep at the time of onset of pain.  The patient had associated radiation to his jaw, neck.  The patient did not have diaphoresis.  No nausea and no palpitations.  The patient stated the chest pain was similar to the chest pain and angina he has had in the past.  The patient took sublingual nitroglycerin x  three and Gaviscon x four without relief and came to the emergency department.   REVIEW OF SYSTEMS:  Cardiac:  See above.  Respiratory:  Positive for shortness f breath, positive dyspnea on exertion.  GI:  Positive GERD symptoms controlled with Prilosec.  Skin:  Positive bright red blood per rectum.  Patient  with hemorrhoids. Neurologic:  The patient has frequent seizures, states about 30 a month, stated  last one was about two nights ago.  Musculoskeletal:  Positive leg cramps at night. GU:  No dysuria.  PROBLEM LIST: 1.  Hypercholesterolemia. 2.  Smoking. 3.  Arteriosclerosis coronary artery disease. 4.  Gastroesophageal reflux disease. 5.  Seizures.  MEDICATIONS: 1.  Depakote 500 mg p.o. t.i.d. 2.  Nitroglycerin 0.4 mg sublingual p.r.n. 3.  Prilosec 20 mg p.o. b.i.d. 4.  Vitamin C, Vitamin E. 5.  Fioricet q.i.d. p.r.n. headaches. 6.  Gaviscon p.r.n.  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  The patient also  has a history of GI bleed, apparently secondary to aspirin.  ALLERGIES:  No known drug allergies.  PAST SURGICAL HISTORY:  The patient had CABG x six in December 1995, status post L3-L4 herniated disk surgery in May 2000.  SOCIAL HISTORY:  Positive tobacco history with half a pack to one pack per day 41 years.  No alcohol, no drugs.  Poor diet.  Takes large amount of caffeine.  The  patient is on disability secondary to his head trauma.  FAMILY HISTORY:  Mother with bone cancer and an uncle on the fathers side with bone cancer.  OTHER PHYSICIANS:  Dr. Deborah Chalk, cardiology; Dr. Marland Kitchen is his neurologist in [place], [place]; Dr. Jeral Fruit is the neurosurgeon.  PHYSICAL EXAMINATION:  VITAL SIGNS:  Within normal limits.  HEENT:  PERRL.  EOMI.  TMs clear bilaterally.  Oropharynx clear.  No erythema, o exudate.  Good dentition.  NECK:  Supple, no lymphadenopathy, no JVD.  CARDIOVASCULAR:  Distant S1 and S2, no murmurs, rubs, or gallops.  LUNGS:  The patient has bibasilar crackles, good air movement, no wheezes.  ABDOMEN:  Soft, nontender, nondistended; positive bowel sounds.  EXTREMITIES:  No lower extremity edema.  RECTAL:  Patient was Hemoccult negative.  NEUROLOGIC:  Cranial nerves 2-12 grossly intact.  LABORATORY DATA:  Chest x-Shaker revealed mild cardiomegaly, no active disease. EKG was unchanged from previous.  CBC showed WBC 8.8, hemoglobin 14, hematocrit 39.5. CK 128.  PT 13.6, INR 1.1, PTT 30.  ASSESSMENT/PLAN: 1.  Cardiac.  Likely unstable angina.  Cardiology was called to start heparin and     nitroglycerin, Lopressor, aspirin, and continue morphine for pain. 2.  Seizure disorder.  The patient has a history of poorly controlled seizures     secondary to not taking his medications.  Will start home medications and     possible loading dose based on his level.  HOSPITAL COURSE: #1 - CARDIAC:  The patient was admitted to the hospital and cardiology saw  the patient.  The patient had his last cardiac catheterization in May 1995, with grafts patent at that time.  Recommended repeat catheterization to evaluate his grafts. CK initially was 128 with MB of 2.7 and then was 165 with MB of 11.3, then 154 ith MB of 10.9.  The first Troponin was less than 0.3, the second Troponin was 0.22. The patient was taken for cardiac catheterization the next morning and grafts were found to be open; however, the patient had diffuse distal disease.  Cardiology recommended medical management.  The patient was continued with IV nitroglycerin and we added nitrol paste and continued with Lopressor and morphine.  His cholesterol was checked and total cholesterol was 184, triglycerides 179, HDL 37, LDL 111.  LFTs were checked and were found to be within normal limits.  The patient was started on Zocor.  On December 29, 1999, day two from admission, postoperative day #1 from catheterization, the patient had recurrence of chest pain but had positive radiation of symptoms with diaphoresis, and chest pain was 8 out of 10.  We increased his nitroglycerin drip and cardiology was on board, and felt like we should continue with medical management.  At that time we increased his Lopressor and continued with morphine and nitroglycerin drip, titrating for symptoms. EKG was also performed at that time, which showed no change, normal sinus rhythm, no signs of ischemia or infarct.  Cardiology felt that the patient had had subendocardial myocardial infarction on admission and had just not had any changes on EKG.  The patient was transferred to 2000 and remained on nitroglycerin drip, nitrol paste, and Lopressor.  On December 31, 1999 and January 01, 2000 the patient as pain-free with no shortness of breath, diaphoresis, or symptoms, and will be discharged today on January 01, 2000.  Discharge physical examination showed vital signs are within normal limits. Normal sinus  rhythm.  Rate is in the 60s.  Blood pressure 101/62.  Cardiovascular:  Regular rate and rhythm.  Lungs are coarse.  No crackles, no wheezes.  The abdomen is soft and nontender, nondistended, with positive bowel sounds.  The extremities showed no clubbing, cyanosis, or edema.  No JVD.  #2 - SEIZURE DISORDER:  Initial laboratory work when the patient was admitted revealed initial valproic acid level of 38.7, which was not therapeutic.  The patient was restarted on home medications of Depakote, checking the depakote level after four half-lives, and the patient was therapeutic with a level of 75.  It as felt like the patient is not taking his Depakote at home, which is probably contributing to the seizures he is having.  The patient was seizure-free over the hospital course.  Will continue the patient on home dose and again provide extensive  education and concern regarding not taking his medications.  The patient is following with Dr. Marland Kitchen in [place], [place] regarding his seizure disorder.  DISCHARGE MEDICATIONS: 1.  Aspirin 81 mg p.o. q.d. 2.  Celebrex 100 mg p.o. b.i.d. 3.  Depakote 500 mg p.o. t.i.d. 4.  Klonopin 0.5 mg p.o. t.i.d. 5.  Nitro paste 1 inch q.6h with none at night x one week. 6.  Sublingual nitroglycerin 0.5 mg under the tongue p.r.n. chest pain. 7.  Lopressor 75 mg p.o. b.i.d. 8.  Zocor 20 mg p.o. q.d.  ACTIVITY:  The patient will consider cardiac rehabilitation.  DIET:  Low-salt/low-cholesterol.  DISPOSITION:  The patient is to call the [facility] at [phone] for  follow-up in one to two weeks with Dr. Janey Greaser.  Primary, Dr. Peyton Najjar, is on obstetric leave at this time.  DISCHARGE DIAGNOSIS: 1.  Subendocardial myocardial infarction. 2.  Seizure disorder.DD:  01/01/00 TD:  01/01/00 Job: [id number]

## 2011-03-10 NOTE — H&P (Signed)
[name], LORIA NO.:  [id number]   MEDICAL RECORD NO.:  [id number]          PATIENT TYPE:  INP   LOCATION:  3732                         FACILITY:  [facility]   PHYSICIAN:  Elmore Guise., M.D.DATE OF BIRTH:  Jul 02, 1951   DATE OF ADMISSION:  08/03/2005  DATE OF DISCHARGE:                                HISTORY & PHYSICAL   REASON FOR ADMISSION:  Chest pain.   HISTORY OF PRESENT ILLNESS:  The patient is a very pleasant 60 year old  white male with past medical history of coronary artery disease (status post  coronary artery bypass grafting in 1995), tobacco dependence, seizure  disorder, dyslipidemia, and past history of noncompliance who presents with  acute episode of substernal chest pain. The patient reports he was in normal  state of health, was actually driving his car to [facility] to take  his daughter for evaluation, and started having retrosternal chest pain. He  states it was similar to his prior anginal episodes. It was a tightness,  radiated to his left neck and left shoulder, associated with shortness of  breath and diaphoresis. He then came to the [facility] ER for further  evaluation. He had no nitroglycerin available to take. On arrival here, he  was given aspirin, morphine, and nitroglycerin with pain relief. He is  currently without chest pain on nitroglycerin drip at 15 mcg/minute. His  initial set of cardiac enzymes were negative. He typically states he is  ambulatory without any significant exertional symptoms. He does have mild  shortness of breath with overexertion. He has off-and-on GERD symptoms  which are controlled with Gaviscon and Prilosec. He reports 20-25 seizures  per month which is stable for him. He has also noticed a productive cough.  No fever. He does report he has stopped his heart medication secondary to  cost issues except he is taking his aspirin.   CURRENT MEDICATIONS:  1.  Depakote 500 mg t.i.d.  2.   Fioricet p.r.n.  3.  Aspirin 325 mg daily.  4.  Prilosec 20 mg daily.   ALLERGIES:  None.   FAMILY HISTORY:  Positive for bone cancer in his mother.   SOCIAL HISTORY:  He is married. He is currently on disability. Has a 60 pack-  year history of tobacco, smoking one to one-and-a-half packs per day. No  current alcohol use.   PHYSICAL EXAMINATION:  VITAL SIGNS:  He is afebrile, temperature is 97.3,  pulse is 65, blood pressure is 142/87, respirations are 18, saturating 96%  on room air.  GENERAL:  He is a very pleasant middle-aged white male, alert and oriented  x4, in no acute distress. He has no JVD and no bruits.  LUNGS:  Clear with prolonged expiratory phase.  HEART:  Regular with a normal S1, S2. No significant murmurs, gallops, or  rubs.  ABDOMEN:  Soft, nontender, nondistended. No rebound or guarding.  EXTREMITIES:  Warm with 2+ pulses and no edema.  NEUROLOGIC:  His cranial nerves are intact with fine resting tremor noted.   LABORATORY DATA:  Shows white count of  8.8, hemoglobin of 14.1, platelets of  219. BUN and creatinine are 11 and 0.9, potassium is 4.1. Coags are 13.7,  1.0, and 28. Myoglobin is 74, MB is 1.1, troponin is less than 0.05. ECG  shows normal sinus rhythm, 78 per minute, inferior T-wave inversion, no  significant change from prior ECG done in 1997. Chest x-[place] showed no acute  cardiopulmonary disease. His last cardiac catheterization was performed  December 01, 2002 which showed diffuse three-vessel coronary disease. Left  main had distal 20% narrowing, LAD was totally occluded after a large septal  perforator, with septal perforator giving collateral branches inferiorly.  Left circumflex has one obtuse marginal which is totally occluded. There is  a smaller obtuse marginal with gives a continuation to the posterolateral  wall, that also gives collaterals to the distal right. RCA is totally  occluded proximally. Vein graft to the diagonal and LAD is patent  with  distal LAD after touchdown showing poor runoff. The vein graft to the acute  marginal, PDA, and posterolateral branch has diffuse disease throughout its  course. It is patent to the acute marginal and posterolateral but is  occluded to the PDA. There is mild sluggish flow in the vessel and distal  runoff also shows diffuse disease. LIMA to diagonal was widely patent with  nice insertion and good distal runoff. LV EF is 50-55% with inferior  akinesia.   IMPRESSION:  1.  Recurrent chest pain.  2.  History of known coronary disease status post coronary artery bypass      grafting in 1995 and most recent catheterization in 2004.  3.  Continued tobacco dependence.  4.  Dyslipidemia.  5.  History of seizure disorder.   PLAN:  Will admit to telemetry. Continue serial cardiac enzymes,  nitroglycerin, heparin, aspirin. Would restart Imdur and atenolol in the  morning, restart his Lipitor 20 mg daily. If his enzymes are negative and he  continues to be chest-pain free, he likely can be discharged home with  medical management. However, if his enzymes are positive, the patient may  require either noninvasive or invasive study in the morning. Will make him  n.p.o. after midnight. Further measures per Dr. Deborah Chalk.      Elmore Guise., M.D.  Electronically Signed     TWK/MEDQ  D:  08/03/2005  T:  08/04/2005  Job:  [id number]

## 2011-03-10 NOTE — Discharge Summary (Signed)
[place]. [facility]  Patient:    [name], [name]                         MRN: [id number] Adm. Date:  [id number] Disc. Date: [id number] Attending:  Garnette Scheuermann Dictator:   Harrold Donath, M.D. CC:         Colleen Can. Deborah Chalk, M.D.   Discharge Summary  CONSULTS:  Colleen Can. Deborah Chalk, M.D., of cardiology.  DISCHARGE DIAGNOSES: 1. Cardiac chest pain. 2. Coronary artery disease.  DISCHARGE MEDICATIONS: 1. Aspirin 325 mg q.d. 2. Atenolol 25 mg q.d. 3. Depakote 500 mg t.i.d. 4. Nitroglycerin 0.4 mg sublingual every five minutes, not to exceed three    tablets in 15 minutes. 5. Prilosec 20 mg b.i.d. 6. Zocor 20 mg q.d. 7. Isosorbide dinitrate 80 mg b.i.d. 8. Plavix 75 mg q.d. x 21 days.  PROCEDURES:  Cardiac catheterization with stenting of the LAD and RCA was done on June 03, 2001.  SVGs were found to be patent.  ADMISSION HISTORY:  The patient is a 60 year old white male who presented to the emergency department after three hours of chest pain which was similar to his chest pain from prior myocardial infarctions.  He had substernal pain that radiated to the left neck and shoulder with nausea.  PROBLEMS DURING ADMISSION:  CHEST PAIN WITH A HISTORY OF CORONARY ARTERY DISEASE:  The patient was admitted and was ruled out by cardiac enzymes with a normal EKG.  He was placed on O2, nitroglycerin, morphine, aspirin, and GI cocktail x 1.  He was noted to be very noncompliant with his cardiac medical regimen.  Cardiology was consulted and a cardiac catheterization was decided upon for June 03, 2001.  The patient was placed on a heparin drip prior to procedure.  The patient underwent the procedure with findings as stated above and was stented secondary to stenosis of the distal LAD and RCA.  The patient tolerated the procedure well and had no complaints of chest pain after the procedure or on the day of discharge.  The patient was then placed on  Plavix for 21 days.  INSTRUCTIONS TO PATIENT:  His new medical regimen was discussed with him and it was stressed that he be more compliant with his cardiac medical regimen. He was also informed to call Dr. ______ office to make an appointment with her, as well as to call Dr. Colleen Can. [place] office to make an appointment with him in three weeks.  CONDITION ON DISCHARGE:  The patient was discharged to home in stable condition. DD:  06/04/01 TD:  06/04/01 Job: [id number]

## 2011-03-10 NOTE — Cardiovascular Report (Signed)
[place]. [facility]  Patient:    [name], [name]. Visit Number: [id number] MRN: [id number]          Service Type: Attending:  Colleen Can. Deborah Chalk, M.D. Dictated by:   Colleen Can Deborah Chalk, M.D. Proc. Date: 07/22/01   CC:         Teaching Service   Cardiac Catheterization  PROCEDURE:  Left heart catheterization with selective coronary angiography, left ventricular angiography, angiography of the saphenous vein grafts x two, angiography of the left internal mammary artery.  CARDIOLOGIST:  Colleen Can. Deborah Chalk, M.D.  INDICATIONS: Mr. Salinger presents with recurrent angina after having angioplasty for the left anterior descending and left main coronary artery six weeks earlier.  He presents for repeat catheterization.  TYPE AND SITE OF ENTRY:  Percutaneous right femoral artery.  CATHETERS:  A 6 French 4 curved right and left coronary catheters, 6 French pigtail ventriculographic catheter, left internal mammograph catheter, a no torque right coronary artery catheter.  CONTRAST MATERIAL:  Omnipaque.  MEDICATIONS GIVEN PRIOR TO THE PROCEDURE:  Valium 10 mg p.o.  MEDICATIONS GIVEN DURING THE PROCEDURE:  Versed 1 mg IV.  COMMENTS:  The patient tolerated the procedure well.  HEMODYNAMIC DATA:  The aortic pressure was 122/68, LV was 123/1-5. There was no aortic valve gradient noted on pullback.  ANGIOGRAPHIC DATA:  1.  Left main coronary artery: Left main coronary artery had patency of the stem, but distal to the stem but before the origin of the left anterior descending and left circumflex there is approximate 50% narrowing.  2.  Left anterior descending:  The left anterior descending is totally occluded after the first septal perforating branch.  The stent in the proximal left anterior descending is widely patent.  3.  Left circumflex:  The left circumflex has an obtuse marginal vessel that is totally occluded.  The continuation of that circumflex as well  as a small second obtuse marginal is free of significant disease.  There is a 50% narrowing in the second obtuse marginal.  4.  Right coronary artery:  The right coronary artery is totally occluded in the early mid portion.  5.  Saphenous vein graft to the first diagonal and the left anterior descending is widely patent.  The body of the saphenous vein graft is smooth, it has nice insertion into the first diagonal vessel. It actually has nice insertion into the left anterior descending itself, but the left anterior descending is diffusely diseased with very little distal runoff.  6.  Saphenous vein graft to the right coronary artery has irregularities in the body of the graft.  There is an insertion into the proximal acute marginal vessel, posterior descending and posterior lateral branch.  In contrast to the study done only six weeks earlier, now there is a severe stenosis in the insertion into the posterior lateral artery.  7.  Left internal mammary artery:  The left internal mammary artery insertion to the obtuse marginal.  The left internal mammary artery has a smooth contour, nice insertion site and good distal runoff.  8.  Left ventricular angiogram:  Left ventricular angiogram was performed in the RAO position.  Overall cardiac size and silhouette are normal.  Global left ventricular ejection fraction would be estimated to be in the 50% range. There is a small discrete area on inferior hyperkinesis.  OVERALL IMPRESSION: 1. Mild inferior hyperkinesis with well-preserved global left ventricular    function. 2. Totally occluded right coronary artery, left anterior descending, and left  circumflex. 3. Renarrowing in the left main coronary artery with approximately 50+% distal    to the stent. 4. Patent saphenous vein graft to the distal right coronary artery with severe    stenosis before the posterior lateral branch. 5. Patent saphenous vein graft to the first diagonal and  left anterior    descending. 6. Patent left internal mammary artery to the obtuse margin.  DISCUSSION: Cines will be reviewed.  Patient will be referred for angioplasty of the distal saphenous vein graft to the posterior lateral branch and will elect to follow the left main stenosis at this time. Dictated by:   Colleen Can Deborah Chalk, M.D. Attending:  Colleen Can. Deborah Chalk, M.D. DD:  07/24/01 TD:  07/24/01 Job: [id number]

## 2011-03-10 NOTE — Cardiovascular Report (Signed)
[place]. [facility]  Patient:    [name], [name] Visit Number: [id number] MRN: [id number]          Service Type: MED Location: [facility]UB [id number] Attending Physician:  Eleanora Neighbor Dictated by:   Colleen Can. Deborah Chalk, M.D. Proc. Date: 01/28/02 Admit Date:  01/27/2002                          Cardiac Catheterization  PROCEDURE:  Left heart catheterization with selective coronary angiography and left ventricular angiography, saphenous vein graft angiography x 2, angiography of the left internal mammary artery, and angioplasty of the left main coronary artery.  CARDIOLOGIST:  Colleen Can. Deborah Chalk, M.D.  TYPE AND SITE OF ENTRY:  Percutaneous right femoral artery.  CATHETERS:  6-French 4 curved Judkins right and left coronary catheters, 6-French pigtail ventricular guiding catheter, no torque right coronary catheter, and a LIMA catheter, JL4 guide, a high-torque floppy guidewire, and 3.5 x 6 mm cutting balloon.  CONTRAST MATERIAL:  Omnipaque.  MEDICATIONS GIVEN DURING PROCEDURE:  Heparin and Integrilin.  COMMENTS:  The patient tolerated the procedure well.  HEMODYNAMIC DATA:  The aortic pressure was 120/65.  LV was 117/22.  There was no aortic valve gradient noted on pullback.  ANGIOGRAPHIC DATA: 1. Left main coronary artery has a 90% distal narrowing. 2. Left anterior descending artery:  The left anterior descending is totally    occluded beyond the first septal perforator branch.  The proximal part    of the left anterior descending is widely patent. 3. Left circumflex: The left circumflex is totally occluded at the obtuse    marginal.  There is 50 to 60% narrowing in a small 1.5 mm branch on the    posterior wall.  There is 60% ostial narrowing in a left circumflex. 4. Right coronary artery: The right coronary artery is totally occluded in    the mid portion. 5. Saphenous vein graft in sequential fashion to the diagonal in the    left anterior  descending is widely patent with a nice insertion into the    diagonal and then a satisfactory insertion into the left anterior    descending, but the distal left anterior descending is diffusely diseased. 6. Saphenous vein graft to the acute margin, posterior descending, and    posterolateral branch is patent.  All of these distal branches are small    and diffusely diseased.  The distal vein graft previously had been    treated with angioplasty and remains widely patent with a nice insertion    and good distal runoff. 7. Left internal mammary artery graft to the LAD is widely patent with a nice    insertion and good distal runoff.  LEFT VENTRICULAR ANGIOGRAM:  Left ventricular angiogram was performed in the RAO position.  Overall cardiac size and silhouette were normal.  There was very mild distal anterior hypokinesis.  The global ejection fraction is 50 to 55%.  There was no mitral regurgitation.  INTERVENTION:  After reviewing the films, it was felt that the culprit lesion was probably the distal left main coronary artery.  It was protected by distal bypass graft.  Initially, we placed a high-torque floppy guidewire across the lesion and the positioned a 6 mm x 3.5 mm cutting balloon.  This resulted in satisfactory dilatation of the left main, but then we had what appeared to be some degree of encroachment on the left anterior descending or it was in  fact the residual from the previous narrowing.  We redirected the guidewire into the left circumflex, and the balloon was inflated to lower atmospheres.  This resulted in a satisfactory dilatation of both branches.  The final angiographic result was felt to be excellent with less than 20% residual stenosis in the ostium of both arteries and no residual stenosis in the distal left main.  OVERALL IMPRESSION: 1. Reasonably well preserved left ventricular function. 2. Severe left main stenosis. 3. Patent saphenous vein grafts and patent  left internal mammary artery    graft to the left anterior descending artery. 4. Totally occluded native vessels. 5. Successful angioplasty of the distal left main coronary artery with    involvement of the ostium of the left anterior descending artery and    ostium of the left circumflex. Dictated by:   Colleen Can Deborah Chalk, M.D. Attending Physician:  Eleanora Neighbor DD:  01/28/02 TD:  01/28/02 Job: [id number]

## 2011-03-10 NOTE — H&P (Signed)
[place]. [facility]  Patient:    [name], [name]                         MRN: [id number] Adm. Date:  [id number] Disc. Date: [id number] Attending:  Sanjuana Letters Dictator:   Clance Boll, M.D. CC:         Colleen Can. Deborah Chalk, M.D.   History and Physical  DATE OF BIRTH:  1951/08/05  SERVICE:  Family Medicine.  HISTORY OF PRESENT ILLNESS:  This is a 60 year old white male status post CABG who presented to the [facility] with complaint of dysuria. However, once he reached the [facility] he complained of 10/10 chest pain, substernal, radiating to his left arm and neck, associated with diaphoresis, shortness of breath, and mild nausea.  This pain was similar to the pain that he had had in the past with prior MIs.  He has not required any sublingual nitroglycerin in the past month.  He received three sublingual nitroglycerin in the clinic, 2 liters of oxygen, and an aspirin, as well as a GI cocktail.  His discomfort remained 8/10 and he was subsequently started on a nitroglycerin drip.  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  MEDICATIONS:  1. Aspirin 81 mg p.o. q.d.  2. Atenolol 25 mg p.o. q.d.  3. Celebrex 100 mg p.o. b.i.d.  4. Depakote 500 mg p.o. t.i.d.  5. Isosorbide dinitrate SR 80 mg p.o. b.i.d.  6. Klonopin 0.5 mg p.o. t.i.d.  7. Nitroglycerin p.r.n.  8. Prilosec 20 mg p.o. b.i.d.  9. Valium 5 mg p.o. b.i.d. 10. Zocor 20 mg p.o. q.d. 11. Vitamin C and vitamin E.  SOCIAL HISTORY:  He is married and still smokes approximately one pack per day.  He has a grown daughter, is a retired Surveyor, minerals, and is presently on disability.  ALLERGIES:  No known drug allergies but he does not tolerate HIGH-DOSE ASPIRIN.  REVIEW OF  SYSTEMS:  He is followed by a neurologist in [facility] for a recurrent seizure disorder.  He has a history of reflux esophagitis and chronic dyspepsia and is on Prilosec currently.  He denies productive cough or sputum production or hemoptysis.  The remainder of review of systems is unremarkable.  He has had a long history of anxiety.  PHYSICAL EXAMINATION:  VITAL SIGNS:  Blood pressure 130/80, pulse 100 and regular, afebrile, with a respiratory rate of 16.  GENERAL:  He is very anxious-appearing but interactive, alert, in mild distress.  HEENT:  [place]/AT, PERRLA, EOMI.  NECK:  Supple, no JVD, no TM.  CARDIOVASCULAR:  Tachycardia, S1, S2, without murmur.  LUNGS:  CTAP without crackle with adequate respiratory effort.  ABDOMEN:  Soft, NT/ND, positive BS.  LABORATORY DATA:  EKG demonstrates normal sinus rhythm with LAFB.  No acute ST or T wave changes.  UA normal.  ASSESSMENT: 1. Chest pain, likely atypical angina in a patient with known coronary disease    and no EKG changes.  Will send him to [facility] emergency room for    admission to telemetry for rule out MI, IV nitroglycerin and morphine,    low-molecular weight heparin.  Continue his beta blocker p.o. and consult    patients cardiologist, Dr. Deborah Chalk. 2. Dysuria.  Patients UA was essentially normal.  This can  be further looked    into once his acute chest pain resolves. DD:  03/13/01 TD:  03/14/01 Job: [id number]

## 2011-03-10 NOTE — Cardiovascular Report (Signed)
[place]. [facility]  Patient:    [name], [name] Visit Number: [id number] MRN: [id number]          Service Type: MED Location: [phone] Attending Physician:  Eleanora Neighbor Proc. Date: 07/24/01 Admit Date:  07/19/2001                          Cardiac Catheterization  DATE OF BIRTH:  1951-07-20  INDICATIONS:  Progressive angina.  PROCEDURE:  Angioplasty of the distal limb of the saphenuos vein graft to the posterolateral branch.  TYPE AND SITE OF ENTRY:  Percutaneous right femoral artery.  CATHETERS:  AL1 guide catheter, Hi-Torque floppy guide wire, 2.0 x 15 mm Maverick balloon.  MEDICATIONS:  Prior to the procedure; Valium 10 mg p.o.  During the procedure; Versed 1 mg IV, Integrilin, IV heparin.  COMMENTS:  The patient tolerated the procedure well.  DESCRIPTION OF PROCEDURE:  The AL1 provided satisfactory backup.  The Hi-Torque floppy guide wire was passed across the lesion, but it was quite tight and very difficult to get the guide wire to pass.  We were able to finally manipulate that into the distal limb of the right coronary artery. Using the 2.0 Maverick balloon for support.  The balloon was then passed and inflated to a maximum of 12 atmospheres.  At 12 atmospheres, the diameter of the balloon was more than ample size for the vessel at that location and it was felt that stenting was not really appropriate.  The final angiographic data was felt to be excellent with no real significant residual lumen.  OVERALL IMPRESSION:  Successful angioplasty of the distal saphenuos vein graft. Attending Physician:  Eleanora Neighbor DD:  07/24/01 TD:  07/24/01 Job: [id number]

## 2011-03-10 NOTE — H&P (Signed)
[place]. [facility]  Patient:    [name], [name]                        MRN: [id number] Adm. Date:  01/16/01 Attending:  Maisie Fus A. Patty Sermons, M.D. CC:         Colleen Can. Deborah Chalk, M.D.  [facility]   History and Physical  CHIEF COMPLAINT:  Chest pain.  HISTORY OF PRESENT ILLNESS:  This is a 60 year old married Caucasian gentleman with known coronary artery disease, who is admitted with severe substernal chest pain which began after he had a seizure earlier this afternoon.  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  He has been on disability since his head injury in 1991.  He does have known ischemic heart disease.  He had bypass graft surgery in 1995, and he has had chronic chest pain syndrome.  He has had multiple cardiac catheterizations by Dr. Deborah Chalk and was last catheterized in May 1999. He was admitted to the hospital in March 2001 with chest pain, and he had a small subendocardial myocardial infarction and underwent catheterization with findings of a patent inferior mammary graft to the obtuse marginal, a patent vein graft to the diagonal and LAD, and a vein graft to right coronary artery that was patent, but there was a 40-50% mid vessel stenosis noted.  There was severe distal disease involving the right coronary artery as well as the distal left anterior descending system, and Dr. Deborah Chalk, after reviewing the films, recommended medical therapy.  He was eventually stabilized and discharged home.  The patient was readmitted on the teaching service in October 2001 with chest pain and ruled for an MI and was discharged improved. Of note is the fact that the patient has continued to smoke cigarettes, up a pack a day despite repeated admonishments to quit smoking.  FAMILY HISTORY:  Strongly positive for heart disease, as recorded in the previous record.  SOCIAL HISTORY:  He lives at  home with his wife.  He has a grown daughter.  He is a retired Surveyor, minerals.  He is presently on disability following his head injury.  ALLERGIES:  He has no known drug allergies but does not tolerate high-dose ASPIRIN.  REVIEW OF SYSTEMS:  He is followed by a neurologist in [facility] for a recurrent seizure disorder.  He has a history of reflux esophagitis and chronic dyspepsia and is on Prilosec twice a day.  He is not having any productive cough or sputum production or hemoptysis.  The remainder of review of systems is otherwise unremarkable.  He has had a long history of anxiety.  CURRENT MEDICATIONS: 1. Prilosec 20 mg b.i.d. 2. Depakote 500 mg t.i.d. 3. Fioricet 1-2 every 6 hours p.r.n. 4. Clonazepam 0.5 mg t.i.d. 5. He has been advised to take a Statin drug but has not been able to afford    it.  He has had cholesterols checked in the [facility] which have been recently elevated.  Today, the patient had a seizure in the early afternoon, after which he began having substernal chest pain with radiation to the left arm and to the left side of his jaw.  He was nauseated but did not actually vomit.  There was only partial response to sublingual nitroglycerin.  He has had improvement after arrival in the emergency room with IV morphine, IV nitroglycerin, and IV heparin.  PHYSICAL  EXAMINATION:  VITAL SIGNS:  Blood pressure 125/70, pulse 68 and regular, respirations normal.  HEENT:  Unremarkable except for evidence of old skull and head injury.  NECK:  The carotids reveal no bruits.  Jugular venous pressure is normal.  CHEST:  Clear.  HEART:  Few rhonchi to the left base.  The heart reveals no S3 gallop, no rub.  ABDOMEN:  Soft and nontender.  EXTREMITIES:  2+ equal pulses.  No edema or phlebitis.  The electrocardiogram shows normal sinus rhythm and a pattern of an old inferior wall MI with Q wave in III and aVF.  Chest x-Brobeck not presently available in  the emergency room.  LABORATORY STUDIES:  Of note include, a creatinine 0.8, electrolytes normal, blood sugars 72, hematocrit 42, hemoglobin 14, white count 10,200.  Valproic acid level is 81.9.  CK-MB 3.0, troponin I 0.01.  The chest x-Getter was read out as mild cardiomegaly and no active disease.  IMPRESSION: 1. Chest pain, rule out myocardial infarction. 2. Status post coronary artery bypass graft 1995, last cath was apparently in    2001. 3. 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 4. History of hypercholesterolemia.  DISPOSITION:  He is being admitted to telemetry, serial enzymes and EKGs, IV nitroglycerin, IV heparin.  Adjust chronic medications for ischemic heart disease.  Trial of a Statin drug trial of beta blocker, and give him small dose of aspirin. DD:  01/16/01 TD:  01/16/01 Job: [id number]

## 2011-03-10 NOTE — H&P (Signed)
NAME:  [name], [name]                            ACCOUNT NO.:  [id number]   MEDICAL RECORD NO.:  [id number]                   PATIENT TYPE:  INP   LOCATION:  1843                                 FACILITY:  [facility]   PHYSICIAN:  Ebbie Ridge, M.D.               DATE OF BIRTH:  1951-03-30   DATE OF ADMISSION:  11/26/2002  DATE OF DISCHARGE:                                HISTORY & PHYSICAL   CHIEF COMPLAINT:  Chest pain.   HISTORY OF PRESENT ILLNESS:  [name] is a 61 year old white male with a past  medical history significant for tobacco abuse, hypercholesterolemia and  known coronary artery disease, who presents to the emergency room  complaining of substernal chest pain x2 hours.  The pain came on at rest and  is associated with nausea, shortness of breath and radiation down the left  arm and up into the left jaw.  The character of pain is tightness and  pressure in the center of his chest.  Severity is 8/10.  He took an antacid  without relief.  He took two nitroglycerin, but pain was still severe, so he  came to the emergency room.  He was given 2 mg of morphine in the emergency  room with some relief, but pain quickly returned to 8/10.   REVIEW OF SYSTEMS:  He denies fever, chills or weight change.  He denies any  palpitations.  He does have lower extremity edema and claudication.  He has  chronic shortness of breath, but no cough.  Except for the nausea, he feels  tonight, he has had no nausea, vomiting, diarrhea, constipation.  He does  have a history of hemorrhoids, but has noticed no bright red blood from his  rectum recently.  He has had no skin rashes or lesions.  He does have a  seizure disorder and has frequent seizures, about 18 to 20 per month.  His  last seizure was two days ago.  He denies any depression or anxiety  currently.  He has chronic low back pain secondary to arthritis.  He wears  glasses.  No diabetes or thyroid disease.  He does have congestion, but  denies any rhinorrhea or sore throat.  He has a normal urinary stream and no  dysuria.  No bruising, bleeding or anemia.   PAST MEDICAL HISTORY:  1. Hypercholesterolemia, tobacco abuse, coronary artery disease, status post     CABG x6 vessels in 12/95, cardiac catheterization in 3/01, cardiac     catheterization with angioplasty to the LAD in 8/02, cardiac     catheterization in 4/03, history of MI in 3/01.  2. Gastroesophageal reflux, no esophagitis.  3. 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   MEDICATIONS:  Aspirin 325 mg p.o. daily, Depakote 500 mg p.o. q.a.m. and q  Noon, 1000 mg q.h.s., nitroglycerin 0.4 mg sublingual p.r.n. chest pain,  Prilosec 20 mg  b.i.d.   ALLERGIES:  No known drug allergies.   SOCIAL HISTORY:  Married.  On daughter age 40.  Lives in [place].  He is  on disability for the past 13 years.  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  He smokes one pack per day.  Denies alcohol.   FAMILY HISTORY:  Mother has bone cancer.   PHYSICAL EXAMINATION:  VITAL SIGNS:  Temperature 97.6, pulse 90, blood  pressure 153/87, respiratory rate 30.  Pulse oximetry 98% on room air.  GENERAL:  [name] is a pleasant white male in no acute distress.  HEENT:  Normocephalic, atraumatic.  Pupils equally round, reactive to light  and accommodation.  Extraocular movements are intact.  Nares are clear.  Tympanic membranes are clear bilaterally.  Oropharynx is without erythema or  edema.  NECK:  Supple, no lymphadenopathy, no JVD.  LUNGS:  Clear to auscultation bilaterally.  Fair air movement and an  occasional wheeze.  CARDIOVASCULAR:  Regular rate and rhythm without murmur.  EXTREMITIES:  There is 1+ pitting bilateral lower extremity edema.  ABDOMEN:  Soft, nontender, nondistended with a palpable liver edge, about 2  cm below the right costal margin, no splenomegaly, no masses.  SKIN:  Clear of  rashes or lesions.  NEUROLOGIC:  Cranial nerves II-XII are intact.  Strength and sensation are  symmetric.  RECTAL:  Heme negative with a smooth prostate, slightly enlarged, no  nodules.   LABORATORY DATA:  Hemoglobin 13.9, hematocrit 44.6, white blood cells 9.4  with 50% neutrophils and 39% lymphocytes, platelets 254.  Valproic acid  level 81.4, CK 222, CK-MB 3.3, troponin I 0.01.  I-stat revealed a sodium of  139, potassium 3.4, chloride 106, bicarbonate 30 with a pH 7.399, pCO2 45.9.  Chest x-Broy:  No active disease.  EKG:  Normal sinus rhythm with inferior T-  wave inversion and Q-waves, unchanged from previous EKGs.   ASSESSMENT/PLAN:  Mr. [name] is a 61 year old white male with history of  tobacco use, hypercholesterolemia, coronary artery disease and seizure  disorder, who is status post 6-vessel coronary artery bypass graft in 1995  and angioplasty in 2002, who now presents with substernal chest pain,  worrisome for cardiac etiology.  The differential diagnosis includes,  myocardial infarction (worrisome considering his presentation), congestive  heart failure, cardiac tamponade, pericarditis, myocarditis less likely with  history and physical.  Pulmonary etiologies include:  Pulmonary embolus,  pneumonia and pneumothorax which do not seem likely.  Other etiologies  include:  Gastroesophageal reflux disease with history of this, on Prilosec.  Doubt gastroenteritis, peptic ulcer disease or other gastrointestinal  sources.  1. Admit to telemetry unit to rule out myocardial infarction.  The patient     began having increased chest pain in the emergency room, thus, will begin     intravenous nitroglycerin and heparin.  Will rule out with serial cardiac     enzymes and if these are elevated, will consult cardiology for     catheterization consideration.  2. Continue morphine 1-2 mg IV q.2h. p.r.n. pain, supplemental oxygen, daily     aspirin. 3. Will give a GI cocktail to see if this  will provide any relief.  4. Continue Depakote for seizure disorder.  5. Consult cardiology as needed.  6. Risk factor modification.  Encourage tobacco cessation.  Check a fasting     lipid panel and begin Statin  if needed.  Check a TSH to rule out     hypothyroidism.  7. Code Status:  Patient is a full code.                                               Ebbie Ridge, M.D.    CH/MEDQ  D:  11/26/2002  T:  11/27/2002  Job:  [id number]   cc:   Kathleen Argue, M.D.  [facility]   Francetta Found, M.D.

## 2011-03-10 NOTE — Discharge Summary (Signed)
NAME:  [name], RUMBLE                            ACCOUNT NO.:  [id number]   MEDICAL RECORD NO.:  [id number]                   PATIENT TYPE:  INP   LOCATION:  2018                                 FACILITY:  [facility]   PHYSICIAN:  Rodolph Bong, M.D.                  DATE OF BIRTH:  [birth year]   DATE OF ADMISSION:  11/26/2002  DATE OF DISCHARGE:  12/03/2002                                 DISCHARGE SUMMARY   FAMILY PRACTICE/TEACHING SERVICE:   PRIMARY MEDICAL PHYSICIAN:  Dr. Lynnea Ferrier Hecox.   DISCHARGE DIAGNOSES:  1. Chest pain.  2. Seizure disorder.  3. Elevated liver function tests.  4. Dyslipidemia.   DISCHARGE MEDICATIONS:  1. Depakote 500 mg p.o. q.a.m.; 500 mg p.o. at 1200; 1000 mg p.o. q.h.s.  2. Aspirin 325 mg p.o. daily.  3. Metoprolol 25 mg p.o. b.i.d.  4. Norvasc 2.5 mg p.o. daily.  5. Niacin 50 mg p.o. b.i.d. with meals.   CONSULTATIONS:  Consult with cardiology, Dr. Deborah Chalk.   PROCEDURES:  1. On 02/04 a portable chest x-Fertig.  2. On 02/09 cardiac catheterization by Dr. Deborah Chalk.   BRIEF ADMISSION HISTORY:  This patient is a 60 year old white male with a  past medical history significant for tobacco abuse, hypercholesterolemia and  known coronary artery disease who presented per ED complaining of substernal  chest pain for 2 hours.  The pain arose at rest and was associated with  nausea, shortness of breath, and radiation down the left arm and up into the  left jaw.  He described the pain as tightness and pressure in the center of  his chest and it was an 8/10.  He did take an antacid prior to presentation  without any relief.  He took 2 nitroglycerin while at home, but his pain was  so severe that he decided to come to the ED.   BRIEF HOSPITAL COURSE:  Problem #1:  The patient presented with a history as  outlined above.  His vitals were stable.  He had slight elevation of his  blood pressure to 153/87.  His chest x-Deloatch showed no acute abnormalities.  An EKG revealed  normal sinus rhythm with inferior inversion T waves  unchanged from prior EKGs.  Consequently, due to his history, he was  admitted, begun on nitro and heparin drips and ruled out for an MI.  He  received 3 sets of cardiac enzymes which were negative for any cardiac  ischemia.  However, cardiology decided to repeat a cardiac catheterization  during this visit.  Consequently he underwent that procedure on December 01, 2002.  The cardiac catheterization was essentially unchanged from his prior  catheterizations and no interventions were necessary at this time.  He was  consequently discontinued from his heparin and nitro drip and remained chest  pain free throughout the rest of his hospital.   Problem #2:  SEIZURE DISORDER.  The patient presents with a known seizure  disorder secondary to a traumatic head injury in the early 1980s.  He states  that he has approximately 18-22 seizures per month.  He has been maintained  on Depakote as outlined above and follows up with a neurologist at [facility].  He  did have one episode of a presumed seizure while in the hospital. It was  unwitnessed, and by the time the nursing staff arrived he had ceased.  He  denied any bowel or bladder incontinence or tongue biting.  No medications  or interventions are necessary at the time.  Otherwise he remains stable.  While in the hospital his Depakote level was in the normal therapeutic  range.   Problem #3: ELEVATED LIVER FUNCTION TESTS.  At the onset of presentation he  was found to have an elevated AST of 58, and ALT of 37.  The patient does  acknowledge a history of an infection in his lip which has caused his  liver tests to be elevated in the past.  However, hepatitis panel taken at  this hospitalization was negative.  He denies any significant alcohol use.  It is known that Depakote did cause slightly elevated LFTs.  His liver  enzymes remain stable throughout his hospitalization.  He did not experience  any  discomfort from this.  He continues to be monitored as an outpatient.   Problem #4:  DYSLIPIDEMIA.  The patient presents with a history of  hypercholesterolemia.  A lipid panel taken during this hospitalization  revealed nearly the opposite.  He had a total cholesterol of 65,  triglycerides 75, HDL of 10 and LDL of 40. However, it is known that lipid  panels can act as acute phase reactants and these may be falsely lowered to  his questionable ischemic event.  However, due to his extremely low HDL  niacin was chosen as appropriate therapy for him especially concerning his  financial status. He was begun, at the time of discharge at 15 mg p.o.  b.i.d. and will be titrated up as an outpatient.   DISCHARGE INSTRUCTIONS:  1. Diet:  American Heart Association low fat diet.  2. Personal Instructions:  The patient was advised to follow up with Rudell Cobb for financial assistance at [phone]. He said that he has tried this     in the past, but to possibly pursue this again in the future.  3. Follow up day number one:  Dr. Rondel Jumbo, [facility], on     Thursday, February 26, at 8:55 a.m.  4. Neurology:  The patient was asked to call for a follow up appointment for     seizure control.  5. Cardiology:  The patient was advised to follow up with Dr. Deborah Chalk on a     p.r.n. basis.                                               Rodolph Bong, M.D.    AK/MEDQ  D:  12/04/2002  T:  12/04/2002  Job:  [id number]

## 2011-03-10 NOTE — H&P (Signed)
[place]  Patient:    [name], [name] Visit Number: [id number] MRN: [id number]          Service Type: MED Location: 1800 1824 01 Attending Physician:  Cathren Laine Dictated by:   Jennet Maduro Earl Gala, R.N., A.N.P. Admit Date:  07/19/2001   CC:         Dr. Shane Crutch, [facility]   History and Physical  CHIEF COMPLAINT:  Chest pain.  HISTORY OF PRESENT ILLNESS:  [name] is a 60 year old white male who has known coronary disease.  He was recently hospitalized for recurrent bouts of chest pain following a seizure in the mid part of August.  He had a stent placed to both the LAD, as well as the left main.  He remains noncompliant with his medicines.  He presents back to emergency department today with recurrence of chest pain.  He states that he had a seizure yesterday, which is not unusual. He reports anywhere from 20-30 seizures a month.  He did not feel well today and then at approximately 10:30 a.m. had the onset of chest pain.  This was identical to his previous chest pain syndrome.  He states that he has been off of his cardiac medicines and only taking his Prilosec, his Depakote, and butabarbital.  He states that he did take his 21 days of Plavix that was recently prescribed.  He was subsequently seen an evaluated in the emergency room.  He has already been started on IV nitroglycerin and heparin.  He is admitted for further evaluation.  PAST MEDICAL HISTORY: 1. Atherosclerotic cardiovascular disease.  He had coronary artery bypass    grafting in 1995.  He has a chronic chest pain syndrome.  He has been    admitted multiple times with recurrent counts of intractable angina.  His    last cardiac catheterization was on June 03, 2001.  His grafts were noted    to be patent at that time with severe distal disease.  After much debate,    it was elected to proceed on with stenting of the LAD and left main. 2. Medical noncompliance. 3. Ongoing  tobacco abuse. 4. Hypercholesterolemia. 5. Previous brain injury in 1991. 6. 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 7. Gastroesophageal reflux disease.  ALLERGIES:  None reported.  CURRENT MEDICATIONS:  He was recently discharged on aspirin, atenolol 25 mg a day, Depakote 50 mg t.i.d., Prilosec 20 mg b.i.d., Zocor 20 mg a day, Imdur 80 mg b.i.d., and Plavix for 21 days.  He states that he is only taking Depakote, Prilosec, and butabarbital for headaches at this present time.  FAMILY HISTORY:  Unchanged per the prior record.  SOCIAL HISTORY:  Unchanged per the prior record.  REVIEW OF SYSTEMS:  He denies any recent fever, flu, or cough.  His chest pain is as described above.  He continues to have trouble with smoking cessation. He is basically homebound and really does not engage in any regular exercise. There is no abdominal pain.  No constipation.  No edema.  Otherwise the review of systems is negative.  PHYSICAL EXAMINATION:  He is currently complaining of mild chest discomfort. He is somewhat chronically ill appearing.  He is able to respond and answer appropriately.  VITAL SIGNS:  The blood pressure is 140/85, heart rate 74, respirations 18, and he is afebrile.  SKIN:  Warm and dry.  His color is somewhat pale.  LUNGS:  Coarse, but basically clear to auscultation.  CHEST:  There is no chest wall tenderness.  CARDIAC:  Regular rhythm.  There was no murmur.  ABDOMEN:  Soft.  Positive bowel sounds.  Nontender without masses.  EXTREMITIES:  Negative without edema.  NEUROLOGIC:  He is somewhat slow to respond, but not felt to demonstrate any gross focal deficits.  LABORATORY DATA:  The first CK and troponin are negative.  The EKG is not acute.  The valproic acid is normal at 79.4.  The hematocrit is 42 and the white count is 8.  IMPRESSION:  Recurrent bouts of chest pain/angina with known coronary disease and a chronic chest pain syndrome.  He has recently  had stents placed to both the left anterior descending and left main.  He is noted to have patent bypass grafts, yet with diffuse distal disease.  PLAN:  He will be admitted.  He has already been placed on IV nitroglycerin and heparin.  Will continue to rule out for myocardial infarction.  Other labs will be checked.  Will need to proceed on with repeat coronary angiography on Monday. Dictated by:   Jennet Maduro Earl Gala, R.N., A.N.P. Attending Physician:  Cathren Laine DD:  07/19/01 TD:  07/19/01 Job: [id number]

## 2011-03-10 NOTE — Consult Note (Signed)
NAME:  [name], [name]                            ACCOUNT NO.:  [id number]   MEDICAL RECORD NO.:  [id number]                   PATIENT TYPE:  INP   LOCATION:  2927                                 FACILITY:  [facility]   PHYSICIAN:  Peter M. Swaziland, M.D.               DATE OF BIRTH:  06/17/1951   DATE OF CONSULTATION:  DATE OF DISCHARGE:                                   CONSULTATION   HISTORY OF PRESENT ILLNESS:  Mr. Colson is a 60 year old white male with  extensive cardiac history, who presents with complaints of substernal chest  pain.  The pain started at rest yesterday evening at 4 o'clock.  It was  described as a tightness and pressure radiating to the left shoulder and  arm.  He complains of a one month history of worsening shortness of breath.  He feels that these symptoms are similar to what he has had from a cardiac  standpoint in the past.  The patient continues to smoke at least one pack  per day.  He has been on no cardiac medications except for aspirin, stating  that he cannot afford them.  His last evaluation 4/03 showed 90% stenosis in  the left main coronary artery, total occlusion of the proximal AD, 60%  stenosis of the left circumflex coronary artery with occlusion of the  marginal branch.  He also had occlusion of the midright coronary artery.  Saphenous vein graft to the diagonal and LEM was widely patent.  The vein  graft to the acute marginal PDA and posterolateral branch of the right  coronary artery was patent and the outlined minigraft to the LAD was patent.  It was noted that the patient had diffuse distal vessel disease.  The  patient did undergo stenting in the LAD and left main coronary artery, 2002.  His original bypass surgery was in 1995.   PAST MEDICAL HISTORY:  1. Atherosclerotic coronary artery disease, status post coronary artery     bypass graft in 1995.  Status post stenting of the left main and LAD in     2002.  2. Tobacco abuse.  3.  Hypercholesterolemia.  4. Status post brain injury in 1991 with subsequent seizure disorder.  5. Gastroesophageal reflux disease.  6. Medical noncompliance.   ALLERGIES:  No known allergies.   CURRENT MEDICATIONS:  Prilosec, aspirin, Depakote.   SOCIAL HISTORY:  Patient is disabled, he is married, has one daughter.  He  smokes one pack per day.  Denies alcohol use.   FAMILY HISTORY:  Mother died with bone cancer, otherwise, negative.   REVIEW OF SYSTEMS:  As per HPI.  The patient does note increased seizure  activity recently.   PHYSICAL EXAMINATION:  GENERAL:  Patient is a middle-aged white male in no  distress.  His affect is very flat.  VITAL SIGNS:  Blood pressure 120/70, pulse 70 and regular, respirations 18.  He is afebrile.  HEENT:  Unremarkable.  He is bearded.  Pupils equal, round and reactive.  NECK:  No JVD or bruits.  LUNGS:  Clear.  CARDIAC:  Regular rate and rhythm without gallops, murmurs, rubs or clicks.  ABDOMEN:  Soft, nontender.  There are no masses or bruits.  CHEST:  Chest wall reveals tenderness to palpation over the left breast.  EXTREMITIES:  Without edema.  Pulses are 1+.  NEUROLOGIC:  Without focal findings.   LABORATORY DATA:  ECG shows normal sinus rhythm with an old inferior  infarction, nonspecific T-wave abnormality.  Chest x-Jeanty shows no active  disease.  White count is 9000, hemoglobin 13.5, hematocrit 38.2.  Platelets  236,000.  PT 117, sodium 134, potassium 3.3, chloride 101, CO2 25, BUN 16,  creatinine 0.8.  Glucose 112.  CK-MB negative x3 and troponin is negative  x2.  TSH is normal at 3.97.   IMPRESSIONS:  1. Prolonged chest pain.  The patient is ruled out for myocardial     infarction.  I still suspect this relates to coronary ischemia given his     extensive history.  This is exacerbated by his medical noncompliance and     continued tobacco abuse.  2. Status post coronary artery bypass graft.  3. Dyslipidemia.  4. Tobacco abuse.   5. History of seizure disorder.   PLAN:  I suspect the patient will require cardiac catheterization to define  the etiology of his ongoing chest pain.  Will discuss this further with  Colleen Can. Deborah Chalk, M.D.  Will continue therapy with aspirin, heparin,  nitroglycerin and beta blockers currently.  Encourage smoking cessation.                                                Peter M. Swaziland, M.D.    PMJ/MEDQ  D:  11/27/2002  T:  11/28/2002  Job:  [id number]   cc:   Hildred Priest, M.D.  [employer]   [employer]. Deborah Chalk, M.D.  [address]  [place]  [place] [address]  Fax: [phone]

## 2011-03-10 NOTE — Discharge Summary (Signed)
NAMECORRION, STIREWALT                  ACCOUNT NO.:  [id number]   MEDICAL RECORD NO.:  [id number]          PATIENT TYPE:  INP   LOCATION:  3732                         FACILITY:  [facility]   PHYSICIAN:  Colleen Can. Deborah Chalk, M.D.DATE OF BIRTH:  04-27-1951   DATE OF ADMISSION:  08/03/2005  DATE OF DISCHARGE:  08/05/2005                                 DISCHARGE SUMMARY   PRIMARY DISCHARGE DIAGNOSIS:  Chest pain with subsequent re-initiation of  cardiac medicines and with a negative adenosine Cardiolite study.   SECONDARY DISCHARGE DIAGNOSES:  1.  Known history of ischemic heart disease, with previous coronary artery      bypass graft surgery in 1995, with a history of multiple      catheterizations, as well as interventions since that time.  2.  Ongoing tobacco abuse.  3.  Seizure disorder.  4.  Dyslipidemia.  5.  Longstanding history of non-compliance.  6.  Remote head injury.  7.  Gastroesophageal reflux disease.  8.  Chronic headaches.   HISTORY OF PRESENT ILLNESS:  The patient is a 60 year old male.  He has  multiple medical problems.  He presented to the hospital with recurrent  episodes of chest pain.  He had been in his normal state of health and was  actually driving his car to [facility] to take his daughter for an  evaluation, when he began starting to have retrosternal chest pain.  It was  similar to his previous chest pain syndrome.  It radiated into the left neck  and left shoulder, and was associated with shortness of breath and  diaphoresis.  He came to the emergency room for evaluation.  He did not have  nitroglycerin on hand to use.  Upon his arrival, he was given aspirin,  morphine and nitroglycerin with relief, and he is currently pain-free on IV  nitroglycerin.  His cardiac enzymes were negative.  He was subsequently seen  by Dr. Rosine Abe and was admitted for further evaluation.  Please see  the dictated history and physical per Dr. Reyes Ivan for further  patient  presentation and profile.   LABORATORY DATA:  On admission cardiac enzymes were negative.  CBC was  normal.  Chemistries were normal.   Electrocardiogram showed no acute changes and a normal sinus rhythm.   HOSPITAL COURSE:  The patient was admitted.  Cardiac medicines were re-  initiated.  He has had a longstanding history of non-compliance.  His last  cardiac catheterization dates back to February 2004, and at that time it was  felt to best manage the patient medically.  We subsequently proceeded on  with an adenosine Cardiolite study which was performed and showed inferior  wall infarction, with no evidence of ischemia.  The ejection fraction was  47%.  The patient was subsequently switched off of IV nitroglycerin and was  on an oral cardiac regimen.  By August 05, 2005, the patient was felt to be  stable for discharge.   CONDITION ON DISCHARGE:  Stable.   DISCHARGE MEDICATIONS:  1.  Depakote 1500 t.i.d.  2.  Prilosec 20  mg b.i.d.  3.  Aspirin 325 mg daily.  4.  Norvasc 5 mg daily.  5.  Zocor 40 mg daily.  6.  Imdur 60 mg daily.  7.  Tenormin 25 mg daily.   FOLLOWUP:  We have asked him to follow up with [place] in the office in one week.   DISCHARGE INSTRUCTIONS:  He is strongly encouraged to not smoke.  Will be  available if problems would arise in the interim.      Sharlee Blew, N.P.      Colleen Can. Deborah Chalk, M.D.  Electronically Signed    LC/MEDQ  D:  09/29/2005  T:  09/29/2005  Job:  [id number]

## 2011-03-10 NOTE — Consult Note (Signed)
[place]. [facility]  Patient:    [name], [name]                         MRN: [id number] Adm. Date:  [id number] Attending:  Eleanora Neighbor CC:         Colleen Can. Deborah Chalk, M.D.  Burnell Blanks, M.D.  Chesley Noon, M.D., [facility], [place]   Consultation Report  DATE OF BIRTH:  July 28, 1951  CHIEF COMPLAINT:  Recurrent seizures.  HISTORY OF PRESENT ILLNESS:  I was asked by Dr. Ronny Flurry to see [name] who was admitted yesterday following a witnessed seizure at home.  He had been sitting on a picnic table with his wife and a friend when he suddenly threw his arms on up and she was aware that he was entering a seizure.  As he fell off the table, she was able to catch his fall, so he did not hurt himself.  The seizure persisted for about five minutes.  The patient was transported to the hospital.  I cannot find an EMS note.  The ER note says that the patient had had two seizures during the day and was seen at 3:10 p.m.  At that time, he was noted to have tightness in his chest that had not been relieved with medication that was quite severe.  Dr. Ronny Flurry was contacted and assumed care for the patient and admitted him.  The patient has a chronic seizure disorder and also pseudoseizures as a result of a 3-1/2 story fall in a work-related accident in August 1991.  The patient had severe head injuries with a depressed skull fracture, subdural, and required extensive reconstructive surgery of his face.  He also injured his lower back with multiple disk injuries and fractured his left ankle and tore his right rotator cuff (these are the things that he can remember at this time).  The patient has been permanently and totally disabled since that time. It is not clear when seizures began but when they did he was seen first by Dr. Fonnie Jarvis and then subsequently by Dr. Avie Echevaria.  He was sent to [facility] for a  prolonged video EEG.  He tells me that a diagnosis of seizures and pseudoseizures was made at that time.  He has recently been followed by Dr. Chesley Noon of [facility].  Dr. Shane Crutch is a neuropsychiatrist.  The patient does not know when he last had an EEG.  The patient has known coronary artery disease and had a coronary artery bypass graft in 1995.  He was last admitted October 2001.  Myocardial infarction was ruled out.  The patient had complaints of chest pain radiating to his left arm and jaw following his seizure while he was in the emergency room.  There was some response to sublingual nitroglycerin and the patient was placed on an IV nitroglycerin drip and heparin.  Around 0130, nursing noted that the patient had a knocking on the bedside table and witnessed the patient having seizure activity.  This was described as grand mal lasting 3-5 minutes.  Heart rate 193-225, blood pressure 123/66. Dr. Patty Sermons was contacted.  The patient was subsequently able to answer questions and said that "I have had seizures, sometimes 20 per month."  An extra 250 mg of Depakote was given.  The patient complained of tightness after the seizure, which was relieved with increasing his nitroglycerin drip.  He had  seizure-like activity at 0510 lasting a minute and a half.  The whole body was not involved in this episode.  The patients valproic acid level as a nontrough around 2:30 in the afternoon was 81.9 mcg/mL.  While this is a solid level, if nontrough it may be amenable to further change.  The note in the chart says that the patients coronary artery disease is not amenable to repeat surgery or other intervention and that medical management would be necessary.  I was consulted to determine the etiology of his seizures and to make recommendations for further workup and treatment.  The patient has a number of risk factors for atherosclerotic disease including hypercholesterolemia and  hypertension.  He has been on beta-blocker and statin drugs, as well as aspirin, in the past.  These have been restarted.  CURRENT MEDICATIONS:  His current medications, according to his wife, are: 1. Depakote 500 mg three times a day. 2. Prilosec 20 mg twice a day. 3. Butalbital up to 6 per day but averaging 2 per day for headaches. 4. Valium 5 mg a day as needed.  The initial intake note from nursing suggested that the patient was taking as much as 2500 mg of Depakote and the patient agreed with this but I have talked with his wife and she says that that is not the case.  A note that came in with the patient suggested that he was also taking Celebrex, clonazepam, and Ultram, as well as vitamin C, vitamin B complex, and vitamin E.  His wife says that he is not taking these medicines.  The patient has been on a number of antiepileptic drugs including Dilantin and phenobarbital in high doses, Tegretol, Neurontin, and possible Keppra.  His wife did not recognize the drugs Felbatol, Trileptal, Lamictal, and Topamax.  ALLERGIES:  None known.  The patient does not tolerate high-dose aspirin well.  REVIEW OF SYSTEMS:  Remarkable for gastroesophageal reflux, in addition to other problems mentioned above.  Review of systems is also remarkable for chronic headaches, short and long-term memory difficulties, hiatal hernia, peptic ulcer, hepatitis (in 1974), hemorrhoids, diarrhea, chronic low-back pain.  Review of systems is otherwise negative.  SOCIAL HISTORY:  The patient is married.  He smokes one pack of cigarettes per day and has done so for 41 years.  He does not use alcohol or recreational drugs.  He is permanently and totally disabled and receives [employer] for his injury.  Thus far, the patients vital signs have been stable.  Troponin has been negative x 2, suggesting that the patient has not had a myocardial infarction.   FAMILY HISTORY:  Unremarkable for seizures.   This is an acquired condition.  PHYSICAL EXAMINATION:  GENERAL:  This is a pleasant gentleman who is somewhat mentally sluggish but who tried very hard to answer my questions.  VITAL SIGNS:  Temperature 98.2, blood pressure 108/66, resting pulse 62, respirations 16, pulse oximetry 97% on 1-1/2 L of oxygen.  HEENT:  A craniotomy defect in the right hemicranium.  Otherwise, no lesions, no bruits.  LUNGS:  Clear to auscultation.  HEART:  No murmurs.  Pulses normal.  ABDOMEN:  Soft.  Bowel sounds normal.  No hepatosplenomegaly.  EXTREMITIES:  A scar in the left ankle.  No edema, cyanosis, or loss of range of motion.  NEUROLOGIC:  Mental status:  The patient was sluggish.  He has fair memory, no dysphagia, no dyspraxia, mild dysarthria.  Cranial nerves:  Round reactive pupils.  Visual fields full  to double simultaneous stimuli.  OKN responses fair.  Symmetric facial strength.  Midline tongue and uvula.  Air conduction greater than bone conduction bilaterally.  Motor examination:  Normal strength without drift.  Fine motor movements are normal.  Sensory examination showed a stocking-glove neuropathy.  The patient had good stereoagnosis.  Two-point discrimination was 8 mm on the right and 6 mm on the left.  Cerebellar examination:  Good finger-to-nose and rapid repetitive movements which were slow.  He had passed pointing with his eyes closed with the right hand pointing persistently to the right of his nasal bridge.  Gait slightly broad-based, unsteady.  Shaky Rombergs.  He tends to fall to the right.  Deep tendon reflexes were diminished everywhere.  Normal knees.  He had bilateral flexor-plantar responses.  IMPRESSION: 1. Generalized seizure, likely partial with secondary generalization, 345.10. 2. History of pseudoseizures. 3. 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 4. Chest pain, non-operative, myocardial infarction ruled out. 5. Chronic low-back pain with peripheral  neuropathy. 6. 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  PLAN: 1. Continue valproic acid at the same dose in the form of Depakote. 2. Check a morning trough, valproic acid level, January 18, 2001. 3. Electroencephalogram January 17, 2001. 4. Ammonia pledgets at bedside with close observation to spot, document, and    record pseudoseizures. 5. I have discussed the condition with his wife by phone. 6. I have placed a phone call to Dr. Shane Crutch to obtain further information    about his past history, which is not otherwise available to me at this    time.  I appreciate the opportunity to see the patient.  We will return him to the care of Dr. Shane Crutch. DD:  01/17/01 TD:  01/17/01 Job: [id number]

## 2011-03-10 NOTE — Cardiovascular Report (Signed)
[place]. [facility]  Patient:    [name], [name]                         MRN: [id number] Proc. Date: 12/28/99 Adm. Date:  [id number] Attending:  Sanjuana Letters CC:         Cardiac Catheterization Laboratory             Chrissie Noa A. Leveda Anna, M.D.                        Cardiac Catheterization  PROCEDURE:  Left heart catheterization with selective coronary angiography, left ventricular angiography, angiography of the left internal mammary artery graft, and angiography of the saphenous vein grafts.  CARDIOLOGIST:  Colleen Can. Deborah Chalk, M.D.  INDICATIONS:  Mr. Maestas has had previous coronary artery bypass grafting in 1995. He has known diffuse coronary atherosclerosis.  His initial cardiac events began in 1986, and he has had several angioplasties prior to that.  He is now referred for recurrent and persistent chest pain, and a small rise in blood enzymes.  TYPE AND SITE OF ENTRY:  Percutaneous right femoral artery with Perclose.  CATHETERS:  The 6-French #4 curved Judkins right and left coronary catheters, 6-French pigtail ventriculographic catheter, 6-French No-Torque (for the saphenous vein graft to the right coronary artery), left internal mammary artery catheter for the left internal mammary artery graft.  CONTRAST MATERIAL:  Omnipaque.  MEDICATIONS GIVEN PRIOR TO PROCEDURE:  Valium 10 mg p.o.  MEDICATIONS GIVEN DURING THE PROCEDURE:  Versed 1 mg IV.  COMMENTS:  The patient tolerated the procedure well.  ANGIOGRAPHIC DATA:  The left ventricular angiogram was performed in the RAO position.  Overall cardiac size and silhouette were normal.  There was mild inferior basilar hypokinesis but the global ejection fraction would be in the 50%-55% range.  There was no mitral regurgitation, intracardiac calcification, r intracavitary defect.  RESULTS: 1. Left internal mammary artery graft to the obtuse marginal artery:  Is widely    patent. It has a  nice insertion site.  There is good distal runoff, although    the obtuse marginal is a relatively small distal bed. 2. Saphenous vein graft to the distal right coronary artery:  The saphenous    vein graft to the right coronary artery is widely patent.  There is a    40%-50% narrowing in the body of the graft which is relatively new, and it    goes to the inferior wall.  The distal vessels are severely and diffusely    diseased, and certainly the concern about the possibility of the ability    to have a repeat operation would be in doubt; however, the insertion sites    are satisfactory, and distal runoff is satisfactory at this point in time.    Certainly this narrowing in the body of the graft is of concern. 3. Saphenous vein graft to the diagonal and left anterior descending coronary    artery:  Is patent with a nice insertion site and satisfactory insertion    into the native left anterior descending.  The native left anterior descending    is so diffusely diseased, it is just a whispy-type of vessel with very poor    distal runoff.  The graft itself appears to be quite satisfactory.  The    diagonal vessel itself is a relatively small vessel. 4. Native right coronary artery:  Is totally occluded approximately  4.0 cm    from its origin. 5. Left main coronary artery:  Has a 60%-70% distal narrowing. 6. Left anterior descending coronary artery:  Is occluded proximally. 7. Left circumflex coronary artery:  The left circumflex marginal vessel is    totally occluded.  There is a continuation branch in the AV groove.  The    marginal vessel that fills from the circumflex is quite small and would    not be suitable for bypass grafting.  OVERALL IMPRESSION: 1. Reasonably well-preserved left ventricular function with inferior basilar    hypokinesis. 2. Totally-occluded native circulation (right coronary artery proximally), left    anterior descending coronary artery in the midportion, and  large obtuse    marginal vessel of the left circumflex). 3. Patent saphenous vein graft to the right coronary artery system, patent    saphenous vein graft to the diagonal and left anterior descending coronary    artery, in a sequential nature, and a patent left internal mammary artery    graft to the obtuse marginal.  DISCUSSION:  Mr. Madole needs to be managed conservatively.  Clearly he has diffuse distal and small vessel disease, and probably will benefit from nitrates if he ill tolerate that.  The possibility of percutaneous intervention is certainly in doubt, but should he have inferior changes on the electrocardiogram, or concern inferiorly, one needs to keep in mind that he does have a mild to moderate narrowing in his saphenous vein graft to the vessels on the underneath side of is heart that could be treated with stenting. DD:  12/28/99 TD:  12/29/99 Job: [id number]

## 2011-03-10 NOTE — Discharge Summary (Signed)
[place]. [facility]  Patient:    Rivera, Austin                         MRN: [id number] Adm. Date:  [id number] Disc. Date: [id number] Attending:  Eleanora Neighbor Dictator:   Jennet Maduro. Earl Gala, R.N., A.N.P. CC:         Colleen Can. Deborah Chalk, M.D.  Deanna Artis. Sharene Skeans, M.D.  Chesley Noon, M.D.   Discharge Summary  DISCHARGE DIAGNOSES: 1. Chest pain with subsequent negative cardiac enzymes with reinitiation of    cardiac medications. 2. 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 3. Arteriosclerotic cardiovascular disease with previous history of coronary    artery bypass grafting with last catheterization March 2001 following small    subendocardial myocardial infarction with findings showing patent mammary    graft to the obtuse marginal, patent vein graft to the diagonal and left    anterior descending coronary artery, and vein graft to the right coronary    artery that was patent but with a 40-50% mid-vessel stenosis noted, and    severe distal disease involving the right coronary as well as the distal    left anterior descending coronary artery.  It was recommended to continue    with medical therapy. 4. Ongoing tobacco abuse. 5. Noncompliance.  HISTORY OF PRESENT ILLNESS:  [name] is a 60 year old male who has known coronary disease.  He presents with severe substernal chest pain.  He has not been taking any of his cardiac medicines for quite some time.  He was subsequently seen and evaluated by Dr. Clovis Pu. Brackbill and admitted for further evaluation.  Please see the dictated history and physical for further patient presentation and profile.  LABORATORY DATA:  A 12-lead echocardiogram showing normal sinus rhythm with part of an old inferior myocardial infarction with Q-waves in leads III and aVF.   Creatinine was 0.8.  Electrolytes were normal.  Blood sugar was 72. Hematocrit 42, hemoglobin 14, white count 10.0.  Valproic acid level was 81.9. Cardiac enzymes were negative.  Chest x-Leu showed mild cardiomegaly with no active disease.  HOSPITAL COURSE:  The patient was admitted to telemetry.  He ruled out negative for myocardial infarction per serial enzymes.  Shortly after his admission to the unit, he began having seizure activity and Dr. Deanna Artis. Hickling was subsequently called in order to evaluate.  An EEG was done which was unremarkable.  It was Dr. Deanna Artis. Hicklings impression that the patient was suffering from a generalized seizure, likely partial with secondary generalization as well as a history of pseudoseizures. 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  It was his recommendations to continue valproic acid at the current dose and to use ammonia caplets at the bedside.  At this time, his neurological care will be referred back to Dr. Shane Crutch in [facility].  Throughout the remainder of his hospitalization, he basically stabilized nicely from a cardiac standpoint.  He continued to have intermittent seizure activity which was treated with ammonia caplets with resolution.  Today, on January 19, 2001, he is doing well with no complaints of chest pain.  Vital signs are stable.  His overall physical exam is unremarkable.  He is felt to be stable for  discharge today with follow up per his neuropsychiatrist in [place] and follow up in our office as an outpatient.  CONDITION ON DISCHARGE:  Stable.  DISCHARGE MEDICATIONS: 1. Prilosec 20 mg b.i.d. 2. Clonopin 0.5 mg t.i.d. 3. Zocor 20 mg q.d. 4. Aspirin q.d. 5. Toprol XL 25 mg q.d. 6. Imdur 60 mg q.d. 7. Norvasc 5 mg q.d. 8. Depakote 500 mg t.i.d.  ACTIVITY:  As tolerated.  DIET:  Low fat.  SPECIAL INSTRUCTIONS:  He is asked to stop smoking.  We will use  ammonia caplets for seizure activity.  We will ask him to follow up in our office in approximately two weeks and he is to call to schedule that appointment.  He is to follow up with Dr. Shane Crutch in [place] in the next one to two weeks. DD:  01/19/01 TD:  01/20/01 Job: [id number]

## 2011-03-27 ENCOUNTER — Other Ambulatory Visit: Payer: Self-pay | Admitting: *Deleted

## 2011-03-27 MED ORDER — PRAVASTATIN SODIUM 40 MG PO TABS: 40.0000 mg | ORAL_TABLET | Freq: Every evening | ORAL | Status: DC

## 2011-03-29 ENCOUNTER — Other Ambulatory Visit: Payer: Self-pay | Admitting: *Deleted

## 2011-03-29 DIAGNOSIS — E785 Hyperlipidemia, unspecified: Secondary | ICD-10-CM

## 2011-03-30 ENCOUNTER — Other Ambulatory Visit (INDEPENDENT_AMBULATORY_CARE_PROVIDER_SITE_OTHER): Payer: Medicare Other | Admitting: *Deleted

## 2011-03-30 DIAGNOSIS — E785 Hyperlipidemia, unspecified: Secondary | ICD-10-CM

## 2011-03-30 LAB — HEPATIC FUNCTION PANEL
ALT: 8 U/L (ref 0–53)
Albumin: 4.2 g/dL (ref 3.5–5.2)
Alkaline Phosphatase: 51 U/L (ref 39–117)
Total Protein: 6.9 g/dL (ref 6.0–8.3)

## 2011-03-30 LAB — BASIC METABOLIC PANEL
CO2: 23 mEq/L (ref 19–32)
Calcium: 9 mg/dL (ref 8.4–10.5)
Chloride: 100 mEq/L (ref 96–112)
Creatinine, Ser: 1.2 mg/dL (ref 0.4–1.5)
Glucose, Bld: 79 mg/dL (ref 70–99)
Sodium: 138 mEq/L (ref 135–145)

## 2011-03-30 LAB — LIPID PANEL: HDL: 44.1 mg/dL (ref >39.00)

## 2011-03-31 ENCOUNTER — Encounter: Payer: Self-pay | Admitting: Cardiology

## 2011-04-04 ENCOUNTER — Ambulatory Visit (INDEPENDENT_AMBULATORY_CARE_PROVIDER_SITE_OTHER): Payer: Medicare Other | Admitting: Cardiology

## 2011-04-04 ENCOUNTER — Encounter: Payer: Self-pay | Admitting: Cardiology

## 2011-04-04 DIAGNOSIS — I5022 Chronic systolic (congestive) heart failure: Secondary | ICD-10-CM

## 2011-04-04 NOTE — Progress Notes (Signed)
Subjective:   [name] comes in today for followup of his ischemic cardiomyopathy. In general, he's been doing amazingly well. His last cardiac catheterization in may of 2008 showed ejection fraction of 15-20% with diffuse cornary atherosclerosis that was not felt to be amenable to redo surgery.  Since that time he had a 2-D echocardiogram in 2010 with estimated ejection fraction of 35-40%. He has clinically done much better than one would expect. He has minimal shortness of breath relative to his COPD and no recurrent chest pain. He does have a history of seizure disorder.  Current Outpatient Prescriptions  Medication Sig Dispense Refill  . aspirin 325 MG tablet Take 325 mg by mouth daily.        . butalbital-acetaminophen-caffeine (FIORICET) 50-325-40 MG per tablet as directed. Take up to 4 tablet by mouth every 24 hours       . carvedilol (COREG) 6.25 MG tablet Take 6.25 mg by mouth 2 (two) times daily with meals.        . divalproex (DEPAKOTE) 500 MG EC tablet Take 500 mg by mouth 3 (three) times daily.        . furosemide (LASIX) 40 MG tablet Take 40 mg by mouth every morning.        Marland Kitchen lisinopril (PRINIVIL,ZESTRIL) 5 MG tablet Take 5 mg by mouth daily.        . pravastatin (PRAVACHOL) 40 MG tablet Take 1 tablet (40 mg total) by mouth every evening.  30 tablet  1  . ranitidine (ZANTAC) 300 MG tablet Take 300 mg by mouth every morning.        Marland Kitchen DISCONTD: azithromycin (ZITHROMAX Z-PAK) 250 MG tablet Take 250 mg by mouth as directed.        Marland Kitchen DISCONTD: guaiFENesin-codeine (CHERATUSSIN AC) 100-10 MG/5ML syrup Take 10 mLs by mouth as needed. For cough         No Known Allergies  Patient Active Problem List  Diagnoses  . HYPERCHOLESTEROLEMIA  . ERECTILE DYSFUNCTION  . TOBACCO DEPENDENCE  . CORONARY, ARTERIOSCLEROSIS  . CHRONIC SYSTOLIC HEART FAILURE  . GASTROESOPHAGEAL REFLUX, NO ESOPHAGITIS  . CONVULSIONS, SEIZURES, NOS    History  Smoking status  . Current Everyday Smoker -- 0.5  packs/day for 50 years  . Types: Cigarettes  Smokeless tobacco  . Never Used    History  Alcohol Use No    Family History  Problem Relation Age of Onset  . Heart disease Brother   . Heart disease Sister   . Coronary artery disease Brother   . Coronary artery disease Sister   . Diabetes Brother   . Thyroid disease Brother   . Heart attack Brother     Review of Systems:   The patient denies any heat or cold intolerance.  No weight gain or weight loss.  The patient denies headaches or blurry vision.  There is no cough or sputum production.  The patient denies dizziness.  There is no hematuria or hematochezia.  The patient denies any muscle aches or arthritis.  The patient denies any rash.  The patient denies frequent falling or instability.  There is no history of depression or anxiety.  All other systems were reviewed and are negative.   Physical Exam:   Weight is 170. Blood pressure 120/90. Heart rate 60.The head is normocephalic and atraumatic.  Pupils are equally round and reactive to light.  Sclerae nonicteric.  Conjunctiva is clear.  Oropharynx is unremarkable.  There's adequate oral airway.  Neck is supple  there are no masses.  Thyroid is not enlarged.  There is no lymphadenopathy.  Lungs are clear.  Chest is symmetric.  Heart shows a regular rate and rhythm.  S1 and S2 are normal.  There is no murmur click or gallop.  Abdomen is soft normal bowel sounds.  There is no organomegaly.  Genital and rectal deferred.  Extremities are without edema.  Peripheral pulses are adequate.  Neurologically intact.  Full range of motion.  The patient is not depressed.  Skin is warm and dry.  Assessment / Plan:

## 2011-04-04 NOTE — Assessment & Plan Note (Signed)
Overall, he continues to do well and is certainly in a class II category. I have encouraged him to stop smoking. He will continue his current medications including carvedilol and lisinopril. I will have him see Dr. Antoine Poche on return

## 2011-04-04 NOTE — Progress Notes (Signed)
Patient called with lab results. Pt verbalized understanding. Jodette Dealie Koelzer RN  

## 2011-04-07 ENCOUNTER — Encounter: Payer: Self-pay | Admitting: Sports Medicine

## 2011-04-07 ENCOUNTER — Ambulatory Visit
Admission: RE | Admit: 2011-04-07 | Discharge: 2011-04-07 | Disposition: A | Discharge status: Home / Self Care | Payer: Self-pay | Source: Ambulatory Visit | Attending: Family Medicine | Admitting: Family Medicine

## 2011-04-07 ENCOUNTER — Ambulatory Visit (INDEPENDENT_AMBULATORY_CARE_PROVIDER_SITE_OTHER): Payer: Self-pay | Admitting: Sports Medicine

## 2011-04-07 VITALS — BP 123/71 | HR 57 | Temp 97.3°F | Ht 73.0 in | Wt 169.0 lb

## 2011-04-07 DIAGNOSIS — M25512 Pain in left shoulder: Secondary | ICD-10-CM

## 2011-04-07 DIAGNOSIS — M25519 Pain in unspecified shoulder: Secondary | ICD-10-CM

## 2011-04-07 DIAGNOSIS — M542 Cervicalgia: Secondary | ICD-10-CM | POA: Insufficient documentation

## 2011-04-07 MED ORDER — GABAPENTIN 300 MG PO CAPS: 300.0000 mg | ORAL_CAPSULE | Freq: Three times a day (TID) | ORAL | Status: DC

## 2011-04-07 MED ORDER — PREDNISONE (PAK) 10 MG PO TABS: ORAL_TABLET | ORAL | Status: DC

## 2011-04-07 MED ORDER — HYDROCODONE-ACETAMINOPHEN 5-500 MG PO TABS: 1.0000 | ORAL_TABLET | Freq: Four times a day (QID) | ORAL | Status: AC | PRN

## 2011-04-07 NOTE — Patient Instructions (Signed)
Great to see you, Xray shoulder. Gabapentin 3x a day. Prednisone taper pack. Vicodin for pain. Come back to see [place] in 2 weeks if not feeling any better.   [name]. Benjamin Stain, M.D. [facility] [address], [place] [address] [phone]

## 2011-04-07 NOTE — Assessment & Plan Note (Signed)
Temporal association with seizure suggests this may be the etiology. 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  He has no weakness. Do not suspect shoulder is dislocated however it may have already reduced itself. Will check XR shoulder with axillary view and assess for typical pathology related to shoulder luxation (Bankarts, Hill-Sacks, reverse Bankart, reverse hill-Sacks) Neurontin. Vicodin Prednisone taper.

## 2011-04-07 NOTE — Progress Notes (Signed)
  Subjective:    Patient ID: [name], male    DOB: 1951/01/14, 60 y.o.   MRN: [id number]  HPI Seizure, GTC 1 wk ago, since then he has had burning pain down L arm from shoulder.  Present mostly lateral arm. Worse with all motions but particularly overhead movements.  Sensation is burning and tingling.  Previous neck XR showed some spondylosis but o/w unremarkable.   Review of Systems See HPI   Objective:   Physical Exam  Constitutional: He appears well-developed and well-nourished. No distress.  Skin: Skin is warm and dry.   Shoulder: Inspection reveals no abnormalities, atrophy or asymmetry. Palpation is normal with no tenderness over AC joint or bicipital groove. ROM is limited in abduction to about 80 deg. Rotator cuff strength normal throughout. He does have a positive Hawkins, Neer signs. Speeds and Yergason's tests normal. No labral pathology noted with negative Obrien's, negative clunk and good stability. No painful arc and no drop arm sign. POSITIVE apprehension sign.      Assessment & Plan:

## 2011-04-10 ENCOUNTER — Telehealth: Payer: Self-pay | Admitting: Sports Medicine

## 2011-04-10 NOTE — Telephone Encounter (Signed)
Pt stated that he is not confused about the medications and that he will consult his surgeon concerning this. Also told pt that Dr. Benjamin Stain would like for him to follow the Tx plan that has been set forth. Pt thanked me for my time.Laureen Ochs, Viann Shove

## 2011-04-10 NOTE — Telephone Encounter (Signed)
Please call regarding results of xrays

## 2011-04-10 NOTE — Telephone Encounter (Signed)
So pt stated that he informed Dr. Benjamin Stain and Dr. Leveda Anna that the medication that was given makes him sick. It messes with his kidneys. Also would like to get a referral to Sports medicine on [address] Dr. Cleophas Dunker.Laureen Ochs, Viann Shove

## 2011-04-10 NOTE — Telephone Encounter (Signed)
Doesn't need a referral for this.  He just needs to comply with the treatment.   Is he confusing neurontin with naproxen because he never said anything about getting sick with prednisone or neurontin.

## 2011-04-10 NOTE — Telephone Encounter (Signed)
Sure

## 2011-04-10 NOTE — Telephone Encounter (Signed)
Informed pt that the xrays did not show any disclocation or fracture. Pt stated that he is having pain,tingling and burning in arm pt wanted to know if he should come in and get a cortisone injection or what? Please advise.Laureen Ochs, Viann Shove

## 2011-04-10 NOTE — Telephone Encounter (Signed)
I already put him on the oral equivalent of cortisone (prednisone taper pack) at the last office visit.  I also already discussed with him about neurontin to decrease the neuropathic type symptoms. He should be currently taking prednisone taper pack and can double his neurontin (should be taking at least TID now) if still having tingly sensations/pain.

## 2011-05-22 ENCOUNTER — Other Ambulatory Visit: Payer: Self-pay | Admitting: Nurse Practitioner

## 2011-05-29 ENCOUNTER — Other Ambulatory Visit: Payer: Self-pay | Admitting: Cardiology

## 2011-05-29 NOTE — Telephone Encounter (Signed)
Fax received from pharmacy. Refill completed. Jodette Satcha Storlie RN  

## 2011-07-20 LAB — POCT I-STAT, CHEM 8
BUN: 18
Calcium, Ion: 1.11 — ABNORMAL LOW
Chloride: 100
HCT: 35 — ABNORMAL LOW
Potassium: 3 — ABNORMAL LOW

## 2011-07-20 LAB — VALPROIC ACID LEVEL: Valproic Acid Lvl: 79.7

## 2011-10-10 ENCOUNTER — Ambulatory Visit (INDEPENDENT_AMBULATORY_CARE_PROVIDER_SITE_OTHER): Payer: Medicare Other | Admitting: Cardiology

## 2011-10-10 ENCOUNTER — Encounter: Payer: Self-pay | Admitting: Cardiology

## 2011-10-10 DIAGNOSIS — J329 Chronic sinusitis, unspecified: Secondary | ICD-10-CM

## 2011-10-10 DIAGNOSIS — I5022 Chronic systolic (congestive) heart failure: Secondary | ICD-10-CM

## 2011-10-10 DIAGNOSIS — E7800 Pure hypercholesterolemia, unspecified: Secondary | ICD-10-CM

## 2011-10-10 DIAGNOSIS — I2510 Atherosclerotic heart disease of native coronary artery without angina pectoris: Secondary | ICD-10-CM

## 2011-10-10 DIAGNOSIS — F17200 Nicotine dependence, unspecified, uncomplicated: Secondary | ICD-10-CM

## 2011-10-10 MED ORDER — AMOXICILLIN 500 MG PO CAPS: 500.0000 mg | ORAL_CAPSULE | Freq: Two times a day (BID) | ORAL | Status: AC

## 2011-10-10 NOTE — Patient Instructions (Signed)
The current medical regimen is effective;  continue present plan and medications. Start Amoxicillin 500 mg one twice a day for 5 days for sinus infection.  Follow up in 6 months with Dr Antoine Poche.  You will receive a letter in the mail 2 months before you are due.  Please call us when you receive this letter to schedule your follow up appointment.

## 2011-10-10 NOTE — Assessment & Plan Note (Signed)
I reviewed extensively all of his office records and available reports. Currently he seems to be having no active symptoms and we will continue with aggressive risk reduction.

## 2011-10-10 NOTE — Assessment & Plan Note (Signed)
He is complaining of sinusitis symptoms.  I will prescribe amoxicillin which he has used to treat this in the past.

## 2011-10-10 NOTE — Assessment & Plan Note (Signed)
I will follow up a lipid profile the goal LDL being less than 70 and HDL greater than 40.

## 2011-10-10 NOTE — Assessment & Plan Note (Signed)
He seems to be euvolemic.  At this point, no change in therapy is indicated.  We have reviewed salt and fluid restrictions.  No further cardiovascular testing is indicated.   

## 2011-10-10 NOTE — Progress Notes (Signed)
HPI The patient presents for followup of coronary disease and ischemic cardiomyopathy. The patient presents as a new patient for me having previously been treated by Dr.Tennant.  Since last being seen he has done relatively well. He is complaining of some sinus congestion. However, he's not been having any chest pressure, neck or arm discomfort.  He is limited by a traumatic accident that he had years ago.  However, he still gets around and denies any recent symptoms  No Known Allergies  Current Outpatient Prescriptions  Medication Sig Dispense Refill  . aspirin 325 MG tablet Take 325 mg by mouth daily.        . butalbital-acetaminophen-caffeine (FIORICET) 50-325-40 MG per tablet 3 (three) times daily.       . carvedilol (COREG) 6.25 MG tablet TAKE ONE TABLET BY MOUTH TWICE DAILY  60 tablet  5  . divalproex (DEPAKOTE) 500 MG EC tablet Take 500 mg by mouth 3 (three) times daily.        . furosemide (LASIX) 40 MG tablet Take 40 mg by mouth every morning.        Marland Kitchen lisinopril (PRINIVIL,ZESTRIL) 5 MG tablet Take 5 mg by mouth daily.        . pravastatin (PRAVACHOL) 40 MG tablet TAKE ONE TABLET BY MOUTH EVERY DAY IN THE EVENING  90 tablet  1  . ranitidine (ZANTAC) 300 MG tablet Take 300 mg by mouth every morning.        Marland Kitchen amoxicillin (AMOXIL) 500 MG capsule Take 1 capsule (500 mg total) by mouth 2 (two) times daily.  10 capsule  0    Past Medical History  Diagnosis Date  . Seizure disorder   . Nausea   . SOB (shortness of breath)   . History of heartburn   . Chronic back pain   . Joint pain   . Headache   . Weakness   . Forgetfulness   . IHD (ischemic heart disease)   . Cigarette smoker   . Shoulder pain, left   . Hypercholesterolemia   . CHF (congestive heart failure)     EF had been 20% initially. 2010 EF 35-40%.    Past Surgical History  Procedure Date  . Cardiac catheterization 03/12/2007  . Coronary artery bypass graft 2000    x6, graft to the PDA and LAD, patent saphenous  vein graft to acute marginal and posterior lateral severely diseased saphenous vein graft to diagonal. Patent LIMA to the LAD.  Marland Kitchen Shoulder surgery     right  . Facial reconstruction surgery   . Back surgery     ROS:  Sinus congestion.  Otherwise, as stated in the HPI and negative for all other systems.  PHYSICAL EXAM BP 114/68  Pulse 55  Ht 6' (1.829 m)  Wt 162 lb (73.483 kg)  BMI 21.97 kg/m2 GENERAL:  Well appearing HEENT:  Pupils equal round and reactive, fundi not visualized, oral mucosa unremarkable NECK:  No jugular venous distention, waveform within normal limits, carotid upstroke brisk and symmetric, no bruits, no thyromegaly LYMPHATICS:  No cervical, inguinal adenopathy LUNGS:  Clear to auscultation bilaterally BACK:  No CVA tenderness CHEST:  Well healed sternotomy scar. HEART:  PMI not displaced or sustained,S1 and S2 within normal limits, no S3, no S4, no clicks, no rubs, no murmurs ABD:  Flat, positive bowel sounds normal in frequency in pitch, no bruits, no rebound, no guarding, no midline pulsatile mass, no hepatomegaly, no splenomegaly EXT:  2 plus pulses throughout, no  edema, no cyanosis no clubbing SKIN:  No rashes no nodules NEURO:  Cranial nerves II through XII grossly intact, motor grossly intact throughout PSYCH:  Cognitively intact, oriented to person place and time, slow speech.    EKG:  On his bradycardia, rate 60, axis within normal limits, intervals within normal limits, inferolateral T wave inversions, no change from previous. 10/10/2011   ASSESSMENT AND PLAN

## 2011-10-10 NOTE — Assessment & Plan Note (Signed)
We discussed at length the need to stop smoking. Hopefully he can be compliant with this in the future.

## 2011-11-13 ENCOUNTER — Other Ambulatory Visit: Payer: Self-pay | Admitting: Nurse Practitioner

## 2011-11-20 ENCOUNTER — Other Ambulatory Visit: Payer: Self-pay | Admitting: Nurse Practitioner

## 2011-11-20 ENCOUNTER — Other Ambulatory Visit: Payer: Self-pay | Admitting: Cardiology

## 2011-11-21 ENCOUNTER — Other Ambulatory Visit: Payer: Self-pay

## 2011-11-21 MED ORDER — CARVEDILOL 6.25 MG PO TABS: 6.2500 mg | ORAL_TABLET | Freq: Two times a day (BID) | ORAL | Status: DC

## 2011-11-21 MED ORDER — LISINOPRIL 5 MG PO TABS: 5.0000 mg | ORAL_TABLET | Freq: Every day | ORAL | Status: DC

## 2011-11-21 MED ORDER — PRAVASTATIN SODIUM 40 MG PO TABS: 40.0000 mg | ORAL_TABLET | Freq: Every day | ORAL | Status: DC

## 2012-01-23 ENCOUNTER — Telehealth: Payer: Self-pay | Admitting: Cardiology

## 2012-01-23 NOTE — Telephone Encounter (Signed)
Fu call Pt was calling back again about this issue

## 2012-01-23 NOTE — Telephone Encounter (Signed)
Per wife - pt has been having trouble that she thought was related to his seizure activity however when he was evaluated today by his neurologist his BP was 68/40 standing and 92/52 with a HR of 65 sitting.  Pt reports that he dozes off all the time when sitting and when he goes to get up he trembles all over.  He has also fallen a few times during these episodes.  She is aware Dr Antoine Poche is not in this week and I will review with DOD and call back with new orders.

## 2012-01-23 NOTE — Telephone Encounter (Signed)
Per wife - pt has been complaining of being thirsty. When reviewed with Dr Elease Hashimoto - orders were given for pt to hold Furosemide for the next 2 days, to have his BP checked and to follow up on Thursday afternoon or Friday.  Pt is encouraged to drink plain water for his thirst.  Wife states understanding.

## 2012-01-23 NOTE — Telephone Encounter (Signed)
Please return call to patient wife Darl Pikes at [phone]  Patient has been experiencing really low blood pressure, seizure like symptoms.  Please return call to patient wife Darl Pikes at [phone].

## 2012-01-24 ENCOUNTER — Telehealth: Payer: Self-pay | Admitting: Cardiology

## 2012-01-24 NOTE — Telephone Encounter (Signed)
Pt states that all of his problems started back some time ago with a cough and congestion.  He had been taking Mucinnex and it did help some but has not cleared things up.  Now he feels as though he is having sinusitis and wants to know if it will be OK for him to see a PCP for an antibiotic.  Instructed pt that he most certainly should follow up with PCP.  BP remains low today.  He has not taken any Furosemide and states he is drinking plenty of fluids.  He will continue to monitor his BP

## 2012-01-24 NOTE — Telephone Encounter (Signed)
Follow-up:    Patient called wanting to know if he could go see another physician to have some amoxicillin prescribed for his other issues.  Patient is also having issues with his bloood pressure, sitting: 77/53 standing: 78/57.  Please call back.

## 2012-01-25 NOTE — Telephone Encounter (Signed)
Per pt - he went to an Urgent Care yesterday and was started on amoxicillin.  States he is feelingsome better today however his BP remains low around 84/56.  He occasionally is dizzy.  Will forward to MD for review.

## 2012-01-26 ENCOUNTER — Telehealth: Payer: Self-pay | Admitting: Cardiology

## 2012-01-26 NOTE — Telephone Encounter (Signed)
Reviewed with Lawson Fiscal, NP. She recommended pt decrease coreg to 3.125mg  twice a day, continue to hold lasix, weigh daily, and avoid salt.

## 2012-01-26 NOTE — Telephone Encounter (Signed)
New msg Pt called and said his BP has been low. He wants to talk to a nurse. He said he has been talking to [facility]. He said he was supposed to find out if he needs to start taking lasix again

## 2012-01-26 NOTE — Telephone Encounter (Signed)
Spoke with pt. Pt states BP has 01/25/12 BP was 84/57 and 96/70. BP today 82/57 70/54 76/47  66/44. Pt states he went Urgent Care for cold symptoms on Wednesday and was given amoxicillin.

## 2012-01-26 NOTE — Telephone Encounter (Signed)
Pt to call back the first of the week with BP readings--any other medication adjustments to be made at that time.

## 2012-01-28 NOTE — Telephone Encounter (Signed)
Schedule follow u appt.

## 2012-01-29 NOTE — Telephone Encounter (Signed)
Called to follow up with pt and his dizziness and hypotenison.  Per wife BP is some better. 95/66.  Pt does have some dizziness on occasion.  He is scheduled for follow 4/19.  Wife will call back if pt becomes worse.

## 2012-02-09 ENCOUNTER — Encounter: Payer: Self-pay | Admitting: Cardiology

## 2012-02-09 ENCOUNTER — Ambulatory Visit (INDEPENDENT_AMBULATORY_CARE_PROVIDER_SITE_OTHER): Payer: Medicare Other | Admitting: Cardiology

## 2012-02-09 VITALS — BP 120/70 | HR 64 | Ht 71.0 in | Wt 164.8 lb

## 2012-02-09 DIAGNOSIS — I5022 Chronic systolic (congestive) heart failure: Secondary | ICD-10-CM

## 2012-02-09 DIAGNOSIS — I2510 Atherosclerotic heart disease of native coronary artery without angina pectoris: Secondary | ICD-10-CM

## 2012-02-09 DIAGNOSIS — E7800 Pure hypercholesterolemia, unspecified: Secondary | ICD-10-CM

## 2012-02-09 DIAGNOSIS — F17200 Nicotine dependence, unspecified, uncomplicated: Secondary | ICD-10-CM

## 2012-02-09 MED ORDER — CARVEDILOL 6.25 MG PO TABS: ORAL_TABLET | ORAL | Status: DC

## 2012-02-09 NOTE — Assessment & Plan Note (Signed)
He seems to be euvolemic.  I would like to try to titrate his carvedilol back to 6.25 mg twice a day. He can hold off on taking a diuretic only as needed.

## 2012-02-09 NOTE — Assessment & Plan Note (Signed)
We talked again about the need to stop smoking but he will be unable to do this. He does not want any prescriptions.

## 2012-02-09 NOTE — Assessment & Plan Note (Signed)
The patient has no new sypmtoms.  No further cardiovascular testing is indicated.  We will continue with aggressive risk reduction and meds as listed.  

## 2012-02-09 NOTE — Patient Instructions (Signed)
Increase Carvedilol to 6.25 mg twice a day Continue all other medications as listed  Follow up in 1 month with Dr Antoine Poche

## 2012-02-09 NOTE — Assessment & Plan Note (Signed)
The LDL last summer was 74 with an HDL of 44. He will continue with the meds as listed.

## 2012-02-09 NOTE — Progress Notes (Signed)
HPI The patient presents for followup of coronary disease and ischemic cardiomyopathy.  Since I last saw him he did have sinus congestion.  While taking a course of Mucinex he became hypotensive. He reduced his carvedilol in half. He has stopped his diuretic. He also stopped taking his Mucinex. Over time his blood pressure is rebound. He is not having any new shortness of breath, PND or orthopnea. He is having no chest pressure, neck or arm discomfort. He denies any palpitations, presyncope or syncope. He unfortunately continues to smoke cigarettes.  No Known Allergies  Current Outpatient Prescriptions  Medication Sig Dispense Refill  . aspirin 325 MG tablet Take 325 mg by mouth daily.        . butalbital-acetaminophen-caffeine (FIORICET) 50-325-40 MG per tablet 3 (three) times daily.       . carvedilol (COREG) 3.125 MG tablet 3.125mg  twice a day--(1/2 of a 6.25mg  twice a day)      . divalproex (DEPAKOTE) 500 MG EC tablet Take 500 mg by mouth 3 (three) times daily.        Marland Kitchen lisinopril (PRINIVIL,ZESTRIL) 5 MG tablet Take 1 tablet (5 mg total) by mouth daily.  90 tablet  4  . pravastatin (PRAVACHOL) 40 MG tablet Take 1 tablet (40 mg total) by mouth daily.  90 tablet  4  . ranitidine (ZANTAC) 300 MG tablet Take 300 mg by mouth every morning.          Past Medical History  Diagnosis Date  . Seizure disorder   . Nausea   . SOB (shortness of breath)   . History of heartburn   . Chronic back pain   . Joint pain   . Headache   . Weakness   . Forgetfulness   . IHD (ischemic heart disease)   . Cigarette smoker   . Shoulder pain, left   . Hypercholesterolemia   . CHF (congestive heart failure)     EF had been 20% initially. 2010 EF 35-40%.    Past Surgical History  Procedure Date  . Cardiac catheterization 03/12/2007  . Coronary artery bypass graft 2000    x6, graft to the PDA and LAD, patent saphenous vein graft to acute marginal and posterior lateral severely diseased saphenous vein  graft to diagonal. Patent LIMA to the LAD.  Marland Kitchen Shoulder surgery     right  . Facial reconstruction surgery   . Back surgery     ROS:  As stated in the HPI and negative for all other systems.  PHYSICAL EXAM BP 120/70  Pulse 64  Ht 5\' 11"  (1.803 m)  Wt 164 lb 12.8 oz (74.753 kg)  BMI 22.98 kg/m2 GENERAL:  Well appearing HEENT:  Pupils equal round and reactive, fundi not visualized, oral mucosa unremarkable NECK:  No jugular venous distention, waveform within normal limits, carotid upstroke brisk and symmetric, no bruits, no thyromegaly LYMPHATICS:  No cervical, inguinal adenopathy LUNGS:  Clear to auscultation bilaterally BACK:  No CVA tenderness, lordosis CHEST:  Well healed sternotomy scar, decreased breath sounds.   HEART:  PMI not displaced or sustained,S1 and S2 within normal limits, no S3, no S4, no clicks, no rubs, no murmurs ABD:  Flat, positive bowel sounds normal in frequency in pitch, no bruits, no rebound, no guarding, no midline pulsatile mass, no hepatomegaly, no splenomegaly EXT:  2 plus pulses throughout, no edema, no cyanosis no clubbing SKIN:  No rashes no nodules NEURO:  Cranial nerves II through XII grossly intact, motor grossly intact throughout  PSYCH:  Cognitively intact, oriented to person place and time, slow speech.    EKG:  Sinus bradycardia, rate 64, axis within normal limits, intervals within normal limits, inferolateral T wave inversions, no change from previous. 02/09/2012   ASSESSMENT AND PLAN

## 2012-03-27 ENCOUNTER — Encounter: Payer: Self-pay | Admitting: Cardiology

## 2012-03-27 ENCOUNTER — Ambulatory Visit (INDEPENDENT_AMBULATORY_CARE_PROVIDER_SITE_OTHER): Payer: Medicare Other | Admitting: Cardiology

## 2012-03-27 VITALS — BP 102/66 | HR 50 | Ht 72.0 in | Wt 168.6 lb

## 2012-03-27 DIAGNOSIS — Z79899 Other long term (current) drug therapy: Secondary | ICD-10-CM

## 2012-03-27 DIAGNOSIS — I5022 Chronic systolic (congestive) heart failure: Secondary | ICD-10-CM

## 2012-03-27 DIAGNOSIS — R569 Unspecified convulsions: Secondary | ICD-10-CM

## 2012-03-27 DIAGNOSIS — E7800 Pure hypercholesterolemia, unspecified: Secondary | ICD-10-CM

## 2012-03-27 DIAGNOSIS — F17200 Nicotine dependence, unspecified, uncomplicated: Secondary | ICD-10-CM

## 2012-03-27 DIAGNOSIS — I2510 Atherosclerotic heart disease of native coronary artery without angina pectoris: Secondary | ICD-10-CM

## 2012-03-27 DIAGNOSIS — R5383 Other fatigue: Secondary | ICD-10-CM

## 2012-03-27 DIAGNOSIS — R5381 Other malaise: Secondary | ICD-10-CM

## 2012-03-27 LAB — CBC WITH DIFFERENTIAL/PLATELET
Basophils Relative: 0.3 % (ref 0.0–3.0)
Eosinophils Relative: 2.2 % (ref 0.0–5.0)
Hemoglobin: 13.3 g/dL (ref 13.0–17.0)
MCV: 106.5 fl — ABNORMAL HIGH (ref 78.0–100.0)
Monocytes Absolute: 0.7 10*3/uL (ref 0.1–1.0)
Neutrophils Relative %: 47 % (ref 43.0–77.0)
RBC: 3.79 Mil/uL — ABNORMAL LOW (ref 4.22–5.81)
WBC: 8.2 10*3/uL (ref 4.5–10.5)

## 2012-03-27 LAB — BASIC METABOLIC PANEL
BUN: 19 mg/dL (ref 6–23)
Chloride: 100 mEq/L (ref 96–112)
GFR: 44.45 mL/min — ABNORMAL LOW (ref >60.00)
Glucose, Bld: 74 mg/dL (ref 70–99)
Potassium: 5.2 mEq/L — ABNORMAL HIGH (ref 3.5–5.1)

## 2012-03-27 LAB — HEPATIC FUNCTION PANEL
ALT: 14 U/L (ref 0–53)
AST: 29 U/L (ref 0–37)
Albumin: 3.7 g/dL (ref 3.5–5.2)
Alkaline Phosphatase: 57 U/L (ref 39–117)
Total Protein: 6.6 g/dL (ref 6.0–8.3)

## 2012-03-27 LAB — LIPID PANEL: VLDL: 20.4 mg/dL (ref 0.0–40.0)

## 2012-03-27 MED ORDER — CARVEDILOL 3.125 MG PO TABS: 3.1250 mg | ORAL_TABLET | Freq: Two times a day (BID) | ORAL | Status: DC

## 2012-03-27 NOTE — Assessment & Plan Note (Signed)
I will try to slowly increase his beta blocker to 9.375 mg twice a day. I will get an echocardiogram to further assess his ejection fraction. If his EF is less than 35% he and I will discuss the possibility of an ICD. He'll come back in one month for this echo.

## 2012-03-27 NOTE — Assessment & Plan Note (Signed)
I will check a lipid profile. It has been sometime since he's had other labs. I will check CBC, basic metabolic profile, magnesium, TSH.

## 2012-03-27 NOTE — Patient Instructions (Signed)
Please increase your Carvedilol by another 3.125 mg twice a day Continue all other medications as listed  Please have blood work today  Your physician has requested that you have an echocardiogram in 1 month. Echocardiography is a painless test that uses sound waves to create images of your heart. It provides your doctor with information about the size and shape of your heart and how well your heart's chambers and valves are working. This procedure takes approximately one hour. There are no restrictions for this procedure.  Please see Dr Antoine Poche back in 6 weeks.

## 2012-03-27 NOTE — Progress Notes (Signed)
HPI The patient presents for followup of coronary disease and ischemic cardiomyopathy.  At the last appointment I increased his carvedilol slightly. We are moving slowly because of previous hypotension. He tolerated this. He's had no lightheadedness or presyncope. He thinks he is breathing better. He has had frequent days where his weight is up a couple of pounds and has been taking his Lasix about every other day. He watches his salt and fluid. He denies any chest pressure, neck or arm discomfort. He's not reporting any palpitations, presyncope or syncope. He has no chest pressure, neck or arm discomfort. He does complain of leg cramping at night.  Allergies  Allergen Reactions  . Aspirin     To much aspirin make his bleed on the inside can take a small amount.    Current Outpatient Prescriptions  Medication Sig Dispense Refill  . aspirin 325 MG tablet Take 325 mg by mouth daily.        . butalbital-acetaminophen-caffeine (FIORICET) 50-325-40 MG per tablet 3 (three) times daily.       . carvedilol (COREG) 6.25 MG tablet 3.125mg  twice a day--(1/2 of a 6.25mg  twice a day)  60 tablet  6  . divalproex (DEPAKOTE) 500 MG EC tablet Take 500 mg by mouth 3 (three) times daily.        . furosemide (LASIX) 40 MG tablet Take 40 mg by mouth every other day.      . lisinopril (PRINIVIL,ZESTRIL) 5 MG tablet Take 1 tablet (5 mg total) by mouth daily.  90 tablet  4  . pravastatin (PRAVACHOL) 40 MG tablet Take 1 tablet (40 mg total) by mouth daily.  90 tablet  4  . ranitidine (ZANTAC) 300 MG tablet Take 300 mg by mouth every morning.          Past Medical History  Diagnosis Date  . Seizure disorder   . Nausea   . SOB (shortness of breath)   . History of heartburn   . Chronic back pain   . Joint pain   . Headache   . Weakness   . Forgetfulness   . IHD (ischemic heart disease)   . Cigarette smoker   . Shoulder pain, left   . Hypercholesterolemia   . CHF (congestive heart failure)     EF had  been 20% initially. 2010 EF 35-40%.    Past Surgical History  Procedure Date  . Cardiac catheterization 03/12/2007  . Coronary artery bypass graft 2000    x6, graft to the PDA and LAD, patent saphenous vein graft to acute marginal and posterior lateral severely diseased saphenous vein graft to diagonal. Patent LIMA to the LAD.  Marland Kitchen Shoulder surgery     right  . Facial reconstruction surgery   . Back surgery     ROS:  As stated in the HPI and negative for all other systems.  PHYSICAL EXAM BP 102/66  Pulse 50  Ht 6' (1.829 m)  Wt 168 lb 9.6 oz (76.476 kg)  BMI 22.87 kg/m2 GENERAL:  Well appearing HEENT:  Pupils equal round and reactive, fundi not visualized, oral mucosa unremarkable NECK:  No jugular venous distention, waveform within normal limits, carotid upstroke brisk and symmetric, no bruits, no thyromegaly LYMPHATICS:  No cervical, inguinal adenopathy LUNGS:  Clear to auscultation bilaterally BACK:  No CVA tenderness, lordosis CHEST:  Well healed sternotomy scar, decreased breath sounds.   HEART:  PMI not displaced or sustained,S1 and S2 within normal limits, no S3, no S4, no clicks,  no rubs, no murmurs ABD:  Flat, positive bowel sounds normal in frequency in pitch, no bruits, no rebound, no guarding, no midline pulsatile mass, no hepatomegaly, no splenomegaly EXT:  2 plus pulses throughout, no edema, no cyanosis no clubbing SKIN:  No rashes no nodules NEURO:  Cranial nerves II through XII grossly intact, motor grossly intact throughout PSYCH:  Cognitively intact, oriented to person place and time.   ASSESSMENT AND PLAN

## 2012-03-27 NOTE — Assessment & Plan Note (Signed)
We again had a discussion about stopping smoking. I don't think he'll be able to do this.

## 2012-03-27 NOTE — Assessment & Plan Note (Signed)
I will take the liberty of checking a Depakote level

## 2012-03-27 NOTE — Assessment & Plan Note (Signed)
The patient has no new sypmtoms.  No further cardiovascular testing is indicated.  We will continue with aggressive risk reduction and meds as listed.  

## 2012-04-03 ENCOUNTER — Ambulatory Visit (HOSPITAL_COMMUNITY): Payer: Medicare Other | Attending: Cardiovascular Disease | Admitting: Radiology

## 2012-04-03 DIAGNOSIS — I5022 Chronic systolic (congestive) heart failure: Secondary | ICD-10-CM

## 2012-04-03 DIAGNOSIS — I509 Heart failure, unspecified: Secondary | ICD-10-CM | POA: Insufficient documentation

## 2012-04-03 DIAGNOSIS — E7800 Pure hypercholesterolemia, unspecified: Secondary | ICD-10-CM | POA: Insufficient documentation

## 2012-04-03 DIAGNOSIS — F17200 Nicotine dependence, unspecified, uncomplicated: Secondary | ICD-10-CM | POA: Insufficient documentation

## 2012-04-03 DIAGNOSIS — I517 Cardiomegaly: Secondary | ICD-10-CM | POA: Insufficient documentation

## 2012-04-03 DIAGNOSIS — I2510 Atherosclerotic heart disease of native coronary artery without angina pectoris: Secondary | ICD-10-CM | POA: Insufficient documentation

## 2012-04-03 NOTE — Progress Notes (Signed)
Echocardiogram performed.  

## 2012-05-07 ENCOUNTER — Ambulatory Visit (INDEPENDENT_AMBULATORY_CARE_PROVIDER_SITE_OTHER): Payer: Medicare Other | Admitting: Cardiology

## 2012-05-07 ENCOUNTER — Encounter: Payer: Self-pay | Admitting: Cardiology

## 2012-05-07 VITALS — BP 102/50 | HR 51 | Ht 72.0 in | Wt 161.4 lb

## 2012-05-07 DIAGNOSIS — I5022 Chronic systolic (congestive) heart failure: Secondary | ICD-10-CM

## 2012-05-07 DIAGNOSIS — E7800 Pure hypercholesterolemia, unspecified: Secondary | ICD-10-CM

## 2012-05-07 DIAGNOSIS — I2510 Atherosclerotic heart disease of native coronary artery without angina pectoris: Secondary | ICD-10-CM

## 2012-05-07 MED ORDER — CARVEDILOL 12.5 MG PO TABS: 12.5000 mg | ORAL_TABLET | Freq: Two times a day (BID) | ORAL | Status: DC

## 2012-05-07 NOTE — Progress Notes (Signed)
HPI The patient presents for followup of coronary disease and ischemic cardiomyopathy.  At the last appointment I increased his carvedilol slightly. He tolerated this without presyncope or syncope. He's having no new shortness of breath but has some chronic dyspnea. He denies any chest pressure, neck or arm discomfort. He denies any palpitations, presyncope or syncope. He's had a little bit of lower extremity edema. He denies any chest pressure, neck or arm discomfort.  Allergies  Allergen Reactions  . Aspirin     To much aspirin make his bleed on the inside can take a small amount.    Current Outpatient Prescriptions  Medication Sig Dispense Refill  . aspirin 325 MG tablet Take 325 mg by mouth daily.        . butalbital-acetaminophen-caffeine (FIORICET) 50-325-40 MG per tablet 3 (three) times daily.       . carvedilol (COREG) 3.125 MG tablet Take 1 tablet (3.125 mg total) by mouth 2 (two) times daily.  60 tablet  6  . carvedilol (COREG) 6.25 MG tablet 6.25 mg 2 (two) times daily with a meal.      . divalproex (DEPAKOTE) 500 MG EC tablet Take 500 mg by mouth 3 (three) times daily.        . furosemide (LASIX) 40 MG tablet Take 40 mg by mouth every other day.      . lisinopril (PRINIVIL,ZESTRIL) 5 MG tablet Take 1 tablet (5 mg total) by mouth daily.  90 tablet  4  . pravastatin (PRAVACHOL) 40 MG tablet Take 1 tablet (40 mg total) by mouth daily.  90 tablet  4  . ranitidine (ZANTAC) 300 MG tablet Take 300 mg by mouth every morning.          Past Medical History  Diagnosis Date  . Seizure disorder   . Nausea   . History of heartburn   . Chronic back pain   . IHD (ischemic heart disease)   . Cigarette smoker   . Hypercholesterolemia   . CHF (congestive heart failure)     EF had been 20% initially. 2010 EF 35-40%.    Past Surgical History  Procedure Date  . Cardiac catheterization 03/12/2007  . Coronary artery bypass graft 2000    x6, graft to the PDA and LAD, patent saphenous  vein graft to acute marginal and posterior lateral severely diseased saphenous vein graft to diagonal. Patent LIMA to the LAD.  Marland Kitchen Shoulder surgery     right  . Facial reconstruction surgery   . Back surgery     ROS:  As stated in the HPI and negative for all other systems.  PHYSICAL EXAM BP 102/50  Pulse 51  Ht 6' (1.829 m)  Wt 161 lb 6.4 oz (73.211 kg)  BMI 21.89 kg/m2 GENERAL:  Frail appearing HEENT:  Pupils equal round and reactive, fundi not visualized, oral mucosa unremarkable NECK:  No jugular venous distention, waveform within normal limits, carotid upstroke brisk and symmetric, no bruits, no thyromegaly LYMPHATICS:  No cervical, inguinal adenopathy LUNGS:  Clear to auscultation bilaterally BACK:  No CVA tenderness, lordosis CHEST:  Well healed sternotomy scar, decreased breath sounds.   HEART:  PMI not displaced or sustained,S1 and S2 within normal limits, no S3, no S4, no clicks, no rubs, no murmurs ABD:  Flat, positive bowel sounds normal in frequency in pitch, no bruits, no rebound, no guarding, no midline pulsatile mass, no hepatomegaly, no splenomegaly EXT:  2 plus pulses throughout, mild ankle edema, no cyanosis no clubbing  SKIN:  No rashes no nodules NEURO:  Cranial nerves II through XII grossly intact, motor grossly intact throughout PSYCH:  Cognitively intact, oriented to person place and time.   ASSESSMENT AND PLAN  CHRONIC SYSTOLIC HEART FAILURE -  I will try to slowly increase his beta blocker to 12.5 mg twice a day. His EF remains stable at 35%.  He would consider an ICD and I will get an EP appt for him.  CORONARY, ARTERIOSCLEROSIS -  The patient has no new sypmtoms. No further cardiovascular testing is indicated. We will continue with aggressive risk reduction and meds as listed.   TOBACCO DEPENDENCE -  We again had a discussion about stopping smoking. He is trying to cut back.  HYPERCHOLESTEROLEMIA -  His LDL was 54 with labs in June.  He will  continue with the medications as listed.  CONVULSIONS, SEIZURES, -  His Depakote was 149.2.  However, he needs to come back early in the am before his dose to get a trough level.  CKD -  In his creatinine was elevated at 1.7 and cholesterol. I will repeat this when he comes back for his Depakote level.

## 2012-05-07 NOTE — Patient Instructions (Addendum)
Please increase you Carvedilol to 12.5 mg twice a day. Continue all other medications as listed.  Please return to lab work as instructed.(coming Friday at 8:30am)  You have been referred to Electrophysiology.  Follow up with Dr Antoine Poche in 2 months.

## 2012-05-10 ENCOUNTER — Other Ambulatory Visit (INDEPENDENT_AMBULATORY_CARE_PROVIDER_SITE_OTHER): Payer: Medicare Other

## 2012-05-10 ENCOUNTER — Telehealth: Payer: Self-pay | Admitting: *Deleted

## 2012-05-10 DIAGNOSIS — I5022 Chronic systolic (congestive) heart failure: Secondary | ICD-10-CM

## 2012-05-10 DIAGNOSIS — I2510 Atherosclerotic heart disease of native coronary artery without angina pectoris: Secondary | ICD-10-CM

## 2012-05-10 DIAGNOSIS — E876 Hypokalemia: Secondary | ICD-10-CM

## 2012-05-10 LAB — BASIC METABOLIC PANEL
CO2: 28 mEq/L (ref 19–32)
Calcium: 9.2 mg/dL (ref 8.4–10.5)
GFR: 48.79 mL/min — ABNORMAL LOW (ref >60.00)
Glucose, Bld: 92 mg/dL (ref 70–99)
Potassium: 3.1 mEq/L — ABNORMAL LOW (ref 3.5–5.1)
Sodium: 139 mEq/L (ref 135–145)

## 2012-05-10 MED ORDER — POTASSIUM CHLORIDE CRYS ER 20 MEQ PO TBCR: EXTENDED_RELEASE_TABLET | ORAL | Status: DC

## 2012-05-10 NOTE — Telephone Encounter (Signed)
Advised patient and scheduled lab recheck next week

## 2012-05-10 NOTE — Telephone Encounter (Signed)
Message copied by Burnell Blanks on Fri May 10, 2012  3:00 PM ------      Message from: Rollene Rotunda      Created: Fri May 10, 2012  1:57 PM       He needs to take 40 meq KCL x 1 and have a BMET repeated in 3 - 4 days.  Needs prescription KDur 20 meq.  Take as directed.  Disp number 30 with 3 refills.

## 2012-05-14 ENCOUNTER — Other Ambulatory Visit (INDEPENDENT_AMBULATORY_CARE_PROVIDER_SITE_OTHER): Payer: Medicare Other

## 2012-05-14 DIAGNOSIS — E876 Hypokalemia: Secondary | ICD-10-CM

## 2012-05-14 LAB — BASIC METABOLIC PANEL
BUN: 23 mg/dL (ref 6–23)
Chloride: 99 mEq/L (ref 96–112)
GFR: 46.01 mL/min — ABNORMAL LOW (ref >60.00)
Potassium: 3.9 mEq/L (ref 3.5–5.1)
Sodium: 136 mEq/L (ref 135–145)

## 2012-05-16 ENCOUNTER — Telehealth: Payer: Self-pay | Admitting: *Deleted

## 2012-05-16 NOTE — Telephone Encounter (Signed)
Message copied by Antony Odea on Thu May 16, 2012  2:45 PM ------      Message from: Rollene Rotunda      Created: Thu May 16, 2012  9:39 AM       Stable creat.

## 2012-05-16 NOTE — Telephone Encounter (Signed)
Gave lab results, pt said he didn't take Kdur at all, he remembered that he had problems in the past with, he stated that "it shut his kidneys down". Pt Stated he has been eating bandana's like a monkey.  So K+ was in normal range on this lab draw.  Reviewed high potassium foods and mailed him a list of potassium rich foods. Told pt I would let DR/NURSE know he didn't take potassium/ I removed it from [facility].

## 2012-05-20 NOTE — Telephone Encounter (Signed)
Will forward to MD for review and his knowledge

## 2012-06-18 ENCOUNTER — Ambulatory Visit: Payer: Self-pay | Admitting: Internal Medicine

## 2012-07-09 ENCOUNTER — Encounter: Payer: Self-pay | Admitting: Cardiology

## 2012-07-09 ENCOUNTER — Ambulatory Visit (INDEPENDENT_AMBULATORY_CARE_PROVIDER_SITE_OTHER): Payer: Medicare Other | Admitting: Cardiology

## 2012-07-09 VITALS — BP 116/63 | HR 53 | Ht 72.0 in | Wt 160.4 lb

## 2012-07-09 DIAGNOSIS — I2510 Atherosclerotic heart disease of native coronary artery without angina pectoris: Secondary | ICD-10-CM

## 2012-07-09 NOTE — Progress Notes (Signed)
HPI The patient presents for followup of coronary disease and ischemic cardiomyopathy.  At the last appointment I increased his carvedilol slightly. He tolerated this without presyncope or syncope. He's having no new shortness of breath but has some chronic dyspnea. He denies any chest pressure, neck or arm discomfort. He denies any palpitations, presyncope or syncope. He has no edema or weight gain. He denies any chest pressure, neck or arm discomfort.  He was referred to EP to consider an ICD.  However, he cancelled this appt and does not want to consider this.  Allergies  Allergen Reactions  . Aspirin     To much aspirin make his bleed on the inside can take a small amount.    Current Outpatient Prescriptions  Medication Sig Dispense Refill  . aspirin 325 MG tablet Take 325 mg by mouth daily.        . carvedilol (COREG) 12.5 MG tablet Take 1 tablet (12.5 mg total) by mouth 2 (two) times daily with a meal.  60 tablet  6  . divalproex (DEPAKOTE) 500 MG EC tablet Take 500 mg by mouth 3 (three) times daily.        . furosemide (LASIX) 40 MG tablet Take 40 mg by mouth every other day.      . lisinopril (PRINIVIL,ZESTRIL) 5 MG tablet Take 1 tablet (5 mg total) by mouth daily.  90 tablet  4  . pravastatin (PRAVACHOL) 40 MG tablet Take 1 tablet (40 mg total) by mouth daily.  90 tablet  4  . ranitidine (ZANTAC) 300 MG tablet Take 300 mg by mouth every morning.        . butalbital-acetaminophen-caffeine (FIORICET) 50-325-40 MG per tablet 3 (three) times daily.         Past Medical History  Diagnosis Date  . Seizure disorder   . Nausea   . History of heartburn   . Chronic back pain   . IHD (ischemic heart disease)   . Cigarette smoker   . Hypercholesterolemia   . CHF (congestive heart failure)     EF had been 20% initially. 2010 EF 35-40%.    Past Surgical History  Procedure Date  . Cardiac catheterization 03/12/2007  . Coronary artery bypass graft 2000    x6, graft to the PDA and  LAD, patent saphenous vein graft to acute marginal and posterior lateral severely diseased saphenous vein graft to diagonal. Patent LIMA to the LAD.  Marland Kitchen Shoulder surgery     right  . Facial reconstruction surgery   . Back surgery     ROS:  As stated in the HPI and negative for all other systems.  PHYSICAL EXAM BP 116/63  Pulse 53  Ht 6' (1.829 m)  Wt 160 lb 6.4 oz (72.757 kg)  BMI 21.75 kg/m2 GENERAL:  Frail appearing HEENT:  Pupils equal round and reactive, fundi not visualized, oral mucosa unremarkable NECK:  No jugular venous distention, waveform within normal limits, carotid upstroke brisk and symmetric, no bruits, no thyromegaly LYMPHATICS:  No cervical, inguinal adenopathy LUNGS:  Clear to auscultation bilaterally BACK:  No CVA tenderness, lordosis CHEST:  Well healed sternotomy scar, decreased breath sounds.   HEART:  PMI not displaced or sustained,S1 and S2 within normal limits, no S3, no S4, no clicks, no rubs, no murmurs ABD:  Flat, positive bowel sounds normal in frequency in pitch, no bruits, no rebound, no guarding, no midline pulsatile mass, no hepatomegaly, no splenomegaly EXT:  2 plus pulses throughout, mild ankle edema,  no cyanosis no clubbing SKIN:  No rashes no nodules NEURO:  Cranial nerves II through XII grossly intact, motor grossly intact throughout PSYCH:  Cognitively intact, oriented to person place and time.   ASSESSMENT AND PLAN  CHRONIC SYSTOLIC HEART FAILURE -  His heart rate will not allow beta blocker titration and he has had orthostasis in the past.  I will not titrate meds further.  He does not want an ICD.    CORONARY, ARTERIOSCLEROSIS -  The patient has no new sypmtoms. No further cardiovascular testing is indicated. We will continue with aggressive risk reduction and meds as listed.   TOBACCO DEPENDENCE -  We again had a discussion about stopping smoking. He has no plan or desire to quit smoking.  CONVULSIONS, SEIZURES, -  His Depakote  level (trough) was repeated and within a therapeutic level.  CKD -  This has been stable and followed.

## 2012-07-09 NOTE — Patient Instructions (Addendum)
The current medical regimen is effective;  continue present plan and medications.  Follow up in 6 months with Dr Hochrein.  You will receive a letter in the mail 2 months before you are due.  Please call us when you receive this letter to schedule your follow up appointment.  

## 2012-12-16 ENCOUNTER — Other Ambulatory Visit: Payer: Self-pay | Admitting: Cardiology

## 2012-12-16 NOTE — Telephone Encounter (Signed)
..   Requested Prescriptions   Pending Prescriptions Disp Refills  . furosemide (LASIX) 40 MG tablet [Pharmacy Med Name: FUROSEMIDE 40MG      TAB] 45 tablet 2    Sig: TAKE ONE TABLET BY MOUTH EVERY OTHER DAY  . carvedilol (COREG) 12.5 MG tablet [Pharmacy Med Name: CARVEDILOL 12.5MG    TAB] 60 tablet 2    Sig: TAKE ONE TABLET BY MOUTH TWICE DAILY WITH MEALS

## 2012-12-16 NOTE — Telephone Encounter (Signed)
Pt  Called to make sure rx gets called in

## 2013-01-20 ENCOUNTER — Ambulatory Visit (HOSPITAL_COMMUNITY)
Admission: RE | Admit: 2013-01-20 | Discharge: 2013-01-20 | Disposition: A | Discharge status: Home / Self Care | Payer: Medicare Other | Source: Ambulatory Visit | Attending: Family Medicine | Admitting: Family Medicine

## 2013-01-20 ENCOUNTER — Encounter: Payer: Self-pay | Admitting: Family Medicine

## 2013-01-20 ENCOUNTER — Ambulatory Visit (INDEPENDENT_AMBULATORY_CARE_PROVIDER_SITE_OTHER): Payer: Medicare Other | Admitting: Family Medicine

## 2013-01-20 VITALS — BP 151/89 | HR 65 | Temp 97.8°F | Ht 72.0 in | Wt 162.0 lb

## 2013-01-20 DIAGNOSIS — M79609 Pain in unspecified limb: Secondary | ICD-10-CM | POA: Insufficient documentation

## 2013-01-20 DIAGNOSIS — M25519 Pain in unspecified shoulder: Secondary | ICD-10-CM

## 2013-01-20 DIAGNOSIS — M79602 Pain in left arm: Secondary | ICD-10-CM

## 2013-01-20 DIAGNOSIS — I498 Other specified cardiac arrhythmias: Secondary | ICD-10-CM | POA: Insufficient documentation

## 2013-01-20 DIAGNOSIS — M25512 Pain in left shoulder: Secondary | ICD-10-CM

## 2013-01-20 LAB — COMPREHENSIVE METABOLIC PANEL
ALT: <8 U/L (ref 0–53)
CO2: 25 mEq/L (ref 19–32)
Chloride: 100 mEq/L (ref 96–112)
Sodium: 134 mEq/L — ABNORMAL LOW (ref 135–145)
Total Bilirubin: 0.5 mg/dL (ref 0.3–1.2)
Total Protein: 6.6 g/dL (ref 6.0–8.3)

## 2013-01-20 MED ORDER — PREDNISONE 20 MG PO TABS: ORAL_TABLET | ORAL | Status: DC

## 2013-01-20 NOTE — Patient Instructions (Addendum)
Mr. Julia,  Thank you for coming in today.  I am sorry to hear about your pain. Your history and exam is consistent with nerve related pain from your neck.  For this please do the following:  Please take prednisone 40 mg for 4 days, then 20 mg for 6 day. Go for neck x-Cotrell  Ok to continue tylenol, no more than 2000 mg daily. Make take 500 mg every 6 hrs.   I will call with lab results.   Dr. Armen Pickup

## 2013-01-21 ENCOUNTER — Telehealth: Payer: Self-pay | Admitting: Family Medicine

## 2013-01-21 LAB — TROPONIN I: Troponin I: <0.01 ng/mL (ref <0.06)

## 2013-01-21 NOTE — Assessment & Plan Note (Signed)
A: pain consistent with cervical radiculopathy. No evidence of cardiac pain with stable EKG and negative troponin. P: Prednisone x 10 day Complete neck x-Rotolo to eval cervical root.  Considered gabapentin but will defer to PCP as patient has hx of seizures and chronic dizziness, initiating this medication should be discussed with his neurologist at at [facility].

## 2013-01-21 NOTE — Progress Notes (Signed)
Subjective:     Patient ID: [name], male   DOB: 18-Mar-1951, 62 y.o.   MRN: [id number]  HPI 62 yo M with known CAD and seizures presents for same day visit to discuss the following:  # L arm pain: pain started initially 3 weeks ago when patient slipped on ice. Pain improved with rest and use of tylenol. Pain then returned about 10 days ago. Gradually worsening and is now constant. Pain is described as tingling and burning sensation that radiates from the L neck, down to his shoulder and arm. Pain is constant. 8/10 in severity. No associated weakness. Patient denies CP, SOB, chest pressure, jaw pain. He admits to some dizziness at baseline which has not been worse over the past 2-3 weeks.   Of note, patient has had neck and shoulder issues since a 3 story fall in 1991 that required r shoulder surgery.   Review of Systems As per HPI     Objective:   Physical Exam BP 151/89  Pulse 65  Temp(Src) 97.8 F (36.6 C) (Oral)  Ht 6' (1.829 m)  Wt 162 lb (73.483 kg)  BMI 21.97 kg/m2  SpO2 100% General appearance: alert, cooperative and no distress Lungs: clear to auscultation bilaterally Heart: regular rate and rhythm, S1, S2 normal, no murmur, click, rub or gallop Neck: Negative spurling's ROM- 45 degrees of rotation on L and R. Unable to touch chin to chest.  Positive Spurling on L.  Grip strength and sensation normal in bilateral hands Strength good C4 to T1 distribution No sensory change to C4 to T1 Reflexes normal  EKG: sinus bradycardia. T wave inversion. No ST elevation. Very similar to last EKG 06/2012.       Assessment and Plan:

## 2013-01-21 NOTE — Telephone Encounter (Signed)
Called patient. trops negative. Labs otherwise stable with sodium a touch low and BUN a touch high, neither altered significantly.  Patient thankful for call and voiced understanding.

## 2013-01-22 ENCOUNTER — Telehealth: Payer: Self-pay | Admitting: Cardiology

## 2013-01-22 NOTE — Telephone Encounter (Signed)
Pt advised to monitor HR and BP but no reason from a cardiac stand point that he can not take medication as RXed.

## 2013-01-22 NOTE — Telephone Encounter (Signed)
New problem    Discuss new medication - predisone   40 mg x 4 days.  20 mg x 6 days.

## 2013-02-13 ENCOUNTER — Ambulatory Visit (INDEPENDENT_AMBULATORY_CARE_PROVIDER_SITE_OTHER): Payer: Medicare Other | Admitting: Cardiology

## 2013-02-13 ENCOUNTER — Encounter: Payer: Self-pay | Admitting: Cardiology

## 2013-02-13 VITALS — BP 108/64 | HR 62 | Ht 72.0 in | Wt 170.8 lb

## 2013-02-13 DIAGNOSIS — I5022 Chronic systolic (congestive) heart failure: Secondary | ICD-10-CM

## 2013-02-13 DIAGNOSIS — I2510 Atherosclerotic heart disease of native coronary artery without angina pectoris: Secondary | ICD-10-CM

## 2013-02-13 NOTE — Patient Instructions (Addendum)
The current medical regimen is effective;  continue present plan and medications.  Follow up in 1 year with Dr Hochrein.  You will receive a letter in the mail 2 months before you are due.  Please call us when you receive this letter to schedule your follow up appointment.  

## 2013-02-13 NOTE — Progress Notes (Signed)
HPI The patient presents for followup of coronary disease and ischemic cardiomyopathy.  He was referred to EP to consider an ICD in the past.  However, he cancelled this appt and does not want to consider this.  He reports no new symptoms since I saw him last. He has had no chest pressure, neck or arm discomfort. He has had no palpitations, presyncope or syncope. He denies any PND or orthopnea.  Allergies  Allergen Reactions  . Aspirin     To much aspirin make his bleed on the inside can take a small amount.    Current Outpatient Prescriptions  Medication Sig Dispense Refill  . aspirin 325 MG tablet Take 325 mg by mouth daily.        . butalbital-acetaminophen-caffeine (FIORICET) 50-325-40 MG per tablet 3 (three) times daily.       . carvedilol (COREG) 12.5 MG tablet TAKE ONE TABLET BY MOUTH TWICE DAILY WITH MEALS  60 tablet  2  . divalproex (DEPAKOTE) 500 MG EC tablet Take 500 mg by mouth 3 (three) times daily. Prescribed by  his neurologist in [facility]      . furosemide (LASIX) 40 MG tablet TAKE ONE TABLET BY MOUTH EVERY OTHER DAY  45 tablet  2  . lisinopril (PRINIVIL,ZESTRIL) 5 MG tablet Take 1 tablet (5 mg total) by mouth daily.  90 tablet  4  . pravastatin (PRAVACHOL) 40 MG tablet Take 1 tablet (40 mg total) by mouth daily.  90 tablet  4  . ranitidine (ZANTAC) 300 MG tablet Take 300 mg by mouth every morning.         No current facility-administered medications for this visit.    Past Medical History  Diagnosis Date  . Seizure disorder   . Nausea   . History of heartburn   . Chronic back pain   . IHD (ischemic heart disease)   . Cigarette smoker   . Hypercholesterolemia   . CHF (congestive heart failure)     EF had been 20% initially. 2010 EF 35-40%.    Past Surgical History  Procedure Laterality Date  . Cardiac catheterization  03/12/2007  . Coronary artery bypass graft  2000    x6, graft to the PDA and LAD, patent saphenous vein graft to acute marginal and  posterior lateral severely diseased saphenous vein graft to diagonal. Patent LIMA to the LAD.  Marland Kitchen Shoulder surgery      right  . Facial reconstruction surgery    . Back surgery      ROS:  As stated in the HPI and negative for all other systems.  PHYSICAL EXAM BP 108/64  Pulse 62  Ht 6' (1.829 m)  Wt 170 lb 12.8 oz (77.474 kg)  BMI 23.16 kg/m2 GENERAL:  Frail appearing HEENT:  Pupils equal round and reactive, fundi not visualized, oral mucosa unremarkable NECK:  No jugular venous distention, waveform within normal limits, carotid upstroke brisk and symmetric, no bruits, no thyromegaly LUNGS:  Clear to auscultation bilaterally BACK:  No CVA tenderness, lordosis CHEST:  Well healed sternotomy scar, decreased breath sounds.   HEART:  PMI not displaced or sustained,S1 and S2 within normal limits, no S3, no S4, no clicks, no rubs, no murmurs ABD:  Flat, positive bowel sounds normal in frequency in pitch, no bruits, no rebound, no guarding, no midline pulsatile mass, no hepatomegaly, no splenomegaly EXT:  2 plus pulses throughout, mild ankle edema, no cyanosis no clubbing   ASSESSMENT AND PLAN  CHRONIC SYSTOLIC HEART  FAILURE -  His heart rate will not allow beta blocker titration and he has had orthostasis in the past.  I will not titrate meds further.  He does not want an ICD.    CORONARY, ARTERIOSCLEROSIS -  The patient has no new sypmtoms. No further cardiovascular testing is indicated. We will continue with aggressive risk reduction and meds as listed.   TOBACCO DEPENDENCE -  We again had a discussion about stopping smoking. He has no plan or desire to quit smoking.  CONVULSIONS, SEIZURES, -  He has daily seizures but these are controled with Depakote.  CKD -  This has been stable and followed.

## 2013-02-17 ENCOUNTER — Other Ambulatory Visit: Payer: Self-pay | Admitting: Cardiology

## 2013-02-18 ENCOUNTER — Other Ambulatory Visit: Payer: Self-pay

## 2013-02-18 ENCOUNTER — Telehealth: Payer: Self-pay | Admitting: Cardiology

## 2013-02-18 NOTE — Telephone Encounter (Signed)
New problem    Out of all medication    [facility] [place]

## 2013-02-18 NOTE — Telephone Encounter (Signed)
Pt called in stating he was out of his medications so I refilled them for him.  Patient Instructions    The current medical regimen is effective;  continue present plan and medications.   Follow up in 1 year with Dr Antoine Poche.  You will receive a letter in the mail 2 months before you are due.  Please call us when you receive this letter to schedule your follow up appointment. Patient Instructions History Recorded   Previous Visit      Provider Department Encounter #    01/22/2013  2:34 PM Rollene Rotunda, MD [facility] [place] [id number]

## 2013-02-19 ENCOUNTER — Other Ambulatory Visit: Payer: Self-pay | Admitting: *Deleted

## 2013-02-19 ENCOUNTER — Other Ambulatory Visit: Payer: Self-pay

## 2013-02-19 MED ORDER — LISINOPRIL 5 MG PO TABS: 5.0000 mg | ORAL_TABLET | Freq: Every day | ORAL | Status: DC

## 2013-02-19 MED ORDER — PRAVASTATIN SODIUM 40 MG PO TABS: 40.0000 mg | ORAL_TABLET | Freq: Every day | ORAL | Status: DC

## 2013-02-19 MED ORDER — FUROSEMIDE 40 MG PO TABS: ORAL_TABLET | ORAL | Status: DC

## 2013-02-19 MED ORDER — CARVEDILOL 12.5 MG PO TABS: ORAL_TABLET | ORAL | Status: DC

## 2013-02-19 NOTE — Telephone Encounter (Signed)
..   Requested Prescriptions   Pending Prescriptions Disp Refills  . carvedilol (COREG) 12.5 MG tablet 60 tablet 2    Sig: TAKE ONE TABLET BY MOUTH TWICE DAILY WITH MEALS

## 2013-02-25 ENCOUNTER — Ambulatory Visit: Payer: Medicare Other | Admitting: Cardiology

## 2013-06-17 ENCOUNTER — Other Ambulatory Visit: Payer: Self-pay

## 2013-06-17 MED ORDER — CARVEDILOL 12.5 MG PO TABS: ORAL_TABLET | ORAL | Status: DC

## 2013-09-14 ENCOUNTER — Other Ambulatory Visit: Payer: Self-pay | Admitting: Cardiology

## 2014-02-09 ENCOUNTER — Other Ambulatory Visit: Payer: Self-pay | Admitting: Cardiology

## 2014-02-19 ENCOUNTER — Encounter: Payer: Self-pay | Admitting: Cardiology

## 2014-02-19 ENCOUNTER — Ambulatory Visit (INDEPENDENT_AMBULATORY_CARE_PROVIDER_SITE_OTHER): Payer: Medicare Other | Admitting: Cardiology

## 2014-02-19 VITALS — BP 104/63 | HR 52 | Ht 72.0 in | Wt 157.4 lb

## 2014-02-19 DIAGNOSIS — I2510 Atherosclerotic heart disease of native coronary artery without angina pectoris: Secondary | ICD-10-CM

## 2014-02-19 DIAGNOSIS — I5022 Chronic systolic (congestive) heart failure: Secondary | ICD-10-CM

## 2014-02-19 LAB — BASIC METABOLIC PANEL
BUN: 20 mg/dL (ref 6–23)
CALCIUM: 9 mg/dL (ref 8.4–10.5)
CO2: 29 meq/L (ref 19–32)
CREATININE: 1.8 mg/dL — AB (ref 0.4–1.5)
Chloride: 100 mEq/L (ref 96–112)
GFR: 41.58 mL/min — AB (ref >60.00)
GLUCOSE: 78 mg/dL (ref 70–99)
Potassium: 4 mEq/L (ref 3.5–5.1)
SODIUM: 135 meq/L (ref 135–145)

## 2014-02-19 LAB — MAGNESIUM: Magnesium: 2.1 mg/dL (ref 1.5–2.5)

## 2014-02-19 NOTE — Patient Instructions (Signed)
The current medical regimen is effective;  continue present plan and medications.  Please have blood work today (BMP and MG level).  Follow up in 1 year with Dr Antoine PocheHochrein.  You will receive a letter in the mail 2 months before you are due.  Please call us when you receive this letter to schedule your follow up appointment.

## 2014-02-19 NOTE — Progress Notes (Signed)
HPI The patient presents for followup of coronary disease and ischemic cardiomyopathy.  He was referred to EP to consider an ICD in the past.  However, he cancelled this appt and does not want to consider this.    He reports no new symptoms since I saw him last. He has had no chest pressure, neck or arm discomfort. He has had no palpitations, presyncope or syncope. He denies any PND or orthopnea.  He is having problems with cramping in his legs.    Allergies  Allergen Reactions  . Aspirin     To much aspirin make his bleed on the inside can take a small amount.    Current Outpatient Prescriptions  Medication Sig Dispense Refill  . aspirin 325 MG tablet Take 325 mg by mouth daily.        . butalbital-acetaminophen-caffeine (FIORICET) 50-325-40 MG per tablet 3 (three) times daily.       . carvedilol (COREG) 12.5 MG tablet TAKE ONE TABLET BY MOUTH TWICE DAILY WITH MEALS  60 tablet  6  . divalproex (DEPAKOTE) 500 MG EC tablet Take 500 mg by mouth 3 (three) times daily. [place]      . furosemide (LASIX) 40 MG tablet TAKE ONE TABLET BY MOUTH EVERY OTHER DAY  30 tablet  0  . lisinopril (PRINIVIL,ZESTRIL) 5 MG tablet Take 1 tablet (5 mg total) by mouth daily.  90 tablet  3  . pravastatin (PRAVACHOL) 40 MG tablet Take 1 tablet (40 mg total) by mouth daily.  90 tablet  3  . ranitidine (ZANTAC) 150 MG capsule Take 150 mg by mouth 2 (two) times daily.       No current facility-administered medications for this visit.    Past Medical History  Diagnosis Date  . Seizure disorder   . Nausea   . History of heartburn   . Chronic back pain   . IHD (ischemic heart disease)   . Cigarette smoker   . Hypercholesterolemia   . CHF (congestive heart failure)     EF had been 20% initially. 2010 EF 35-40%.    Past Surgical History  Procedure Laterality Date  . Cardiac catheterization  03/12/2007  . Coronary artery bypass graft  2000    x6, graft to the PDA and  LAD, patent saphenous vein graft to acute marginal and posterior lateral severely diseased saphenous vein graft to diagonal. Patent LIMA to the LAD.  Marland Kitchen. Shoulder surgery      right  . Facial reconstruction surgery    . Back surgery      ROS:  As stated in the HPI and negative for all other systems.  PHYSICAL EXAM BP 104/63  Pulse 52  Ht 6' (1.829 m)  Wt 157 lb 6.4 oz (71.396 kg)  BMI 21.34 kg/m2 GENERAL:  Frail appearing HEENT:  Pupils equal round and reactive, fundi not visualized, oral mucosa unremarkable NECK:  No jugular venous distention, waveform within normal limits, carotid upstroke brisk and symmetric, no bruits, no thyromegaly LUNGS:  Clear to auscultation bilaterally BACK:  No CVA tenderness, lordosis CHEST:  Well healed sternotomy scar, decreased breath sounds.   HEART:  PMI not displaced or sustained,S1 and S2 within normal limits, no S3, no S4, no clicks, no rubs, no murmurs ABD:  Flat, positive bowel sounds normal in frequency in pitch, no bruits, no rebound, no guarding, no midline pulsatile mass, no hepatomegaly, no splenomegaly EXT:  2 plus pulses throughout, mild ankle  edema, no cyanosis no clubbing  EKG:  Sinus rhythm, rate 53, interventricular conduction delay, poor anterior R wave progression, nonspecific lateral T-wave changes unchanged from previous. 02/19/2014  ASSESSMENT AND PLAN  CHRONIC SYSTOLIC HEART FAILURE -  His heart rate will not allow beta blocker titration and he has had orthostasis in the past.  I will not titrate meds further.  He does not want an ICD.    CORONARY, ARTERIOSCLEROSIS -  The patient has no new sypmtoms. No further cardiovascular testing is indicated. We will continue with aggressive risk reduction and meds as listed.   TOBACCO DEPENDENCE -  We again had a discussion about stopping smoking. He has no plan or desire to quit smoking.  CONVULSIONS, SEIZURES, -  He has seizures 20 per month and this is baseline.  He is following with  [facility].  CRAMPS - I will check a BMET and magnesium.

## 2014-02-26 ENCOUNTER — Telehealth: Payer: Self-pay | Admitting: Cardiology

## 2014-02-26 NOTE — Telephone Encounter (Signed)
New message ° ° ° ° °Want lab results °

## 2014-02-26 NOTE — Telephone Encounter (Signed)
Pt is aware of lab results and MD's recommendations.  

## 2014-03-30 ENCOUNTER — Emergency Department (HOSPITAL_COMMUNITY)
Admission: EM | Admit: 2014-03-30 | Discharge: 2014-03-30 | Disposition: A | Discharge status: Home / Self Care | Payer: Medicare Other | Attending: Emergency Medicine | Admitting: Emergency Medicine

## 2014-03-30 ENCOUNTER — Emergency Department (HOSPITAL_COMMUNITY): Payer: Medicare Other

## 2014-03-30 ENCOUNTER — Encounter (HOSPITAL_COMMUNITY): Payer: Self-pay | Admitting: Emergency Medicine

## 2014-03-30 DIAGNOSIS — G40909 Epilepsy, unspecified, not intractable, without status epilepticus: Secondary | ICD-10-CM | POA: Diagnosis not present

## 2014-03-30 DIAGNOSIS — I509 Heart failure, unspecified: Secondary | ICD-10-CM | POA: Insufficient documentation

## 2014-03-30 DIAGNOSIS — F29 Unspecified psychosis not due to a substance or known physiological condition: Secondary | ICD-10-CM | POA: Insufficient documentation

## 2014-03-30 DIAGNOSIS — Y9289 Other specified places as the place of occurrence of the external cause: Secondary | ICD-10-CM | POA: Diagnosis not present

## 2014-03-30 DIAGNOSIS — IMO0002 Reserved for concepts with insufficient information to code with codable children: Secondary | ICD-10-CM | POA: Insufficient documentation

## 2014-03-30 DIAGNOSIS — Z79899 Other long term (current) drug therapy: Secondary | ICD-10-CM | POA: Diagnosis not present

## 2014-03-30 DIAGNOSIS — Y939 Activity, unspecified: Secondary | ICD-10-CM | POA: Insufficient documentation

## 2014-03-30 DIAGNOSIS — E7800 Pure hypercholesterolemia, unspecified: Secondary | ICD-10-CM | POA: Insufficient documentation

## 2014-03-30 DIAGNOSIS — S298XXA Other specified injuries of thorax, initial encounter: Secondary | ICD-10-CM | POA: Diagnosis not present

## 2014-03-30 DIAGNOSIS — W19XXXA Unspecified fall, initial encounter: Secondary | ICD-10-CM

## 2014-03-30 DIAGNOSIS — S0990XA Unspecified injury of head, initial encounter: Secondary | ICD-10-CM | POA: Diagnosis not present

## 2014-03-30 DIAGNOSIS — S0280XA Fracture of other specified skull and facial bones, unspecified side, initial encounter for closed fracture: Secondary | ICD-10-CM | POA: Insufficient documentation

## 2014-03-30 DIAGNOSIS — Z9889 Other specified postprocedural states: Secondary | ICD-10-CM | POA: Insufficient documentation

## 2014-03-30 DIAGNOSIS — F17200 Nicotine dependence, unspecified, uncomplicated: Secondary | ICD-10-CM | POA: Diagnosis not present

## 2014-03-30 DIAGNOSIS — Z7982 Long term (current) use of aspirin: Secondary | ICD-10-CM | POA: Diagnosis not present

## 2014-03-30 DIAGNOSIS — S0993XA Unspecified injury of face, initial encounter: Secondary | ICD-10-CM | POA: Diagnosis present

## 2014-03-30 DIAGNOSIS — R569 Unspecified convulsions: Secondary | ICD-10-CM

## 2014-03-30 DIAGNOSIS — Z951 Presence of aortocoronary bypass graft: Secondary | ICD-10-CM | POA: Insufficient documentation

## 2014-03-30 DIAGNOSIS — R296 Repeated falls: Secondary | ICD-10-CM | POA: Diagnosis not present

## 2014-03-30 DIAGNOSIS — S01409A Unspecified open wound of unspecified cheek and temporomandibular area, initial encounter: Secondary | ICD-10-CM | POA: Diagnosis not present

## 2014-03-30 DIAGNOSIS — G8929 Other chronic pain: Secondary | ICD-10-CM | POA: Diagnosis not present

## 2014-03-30 DIAGNOSIS — S199XXA Unspecified injury of neck, initial encounter: Secondary | ICD-10-CM | POA: Diagnosis present

## 2014-03-30 DIAGNOSIS — S0083XA Contusion of other part of head, initial encounter: Secondary | ICD-10-CM | POA: Diagnosis not present

## 2014-03-30 DIAGNOSIS — S0003XA Contusion of scalp, initial encounter: Secondary | ICD-10-CM | POA: Insufficient documentation

## 2014-03-30 DIAGNOSIS — Z87820 Personal history of traumatic brain injury: Secondary | ICD-10-CM | POA: Diagnosis not present

## 2014-03-30 DIAGNOSIS — S0292XA Unspecified fracture of facial bones, initial encounter for closed fracture: Secondary | ICD-10-CM

## 2014-03-30 DIAGNOSIS — S1093XA Contusion of unspecified part of neck, initial encounter: Secondary | ICD-10-CM

## 2014-03-30 HISTORY — DX: Unspecified convulsions: R56.9

## 2014-03-30 HISTORY — DX: Unspecified intracranial injury with loss of consciousness of unspecified duration, initial encounter: S06.9X9A

## 2014-03-30 HISTORY — DX: Unspecified intracranial injury with loss of consciousness status unknown, initial encounter: S06.9XAA

## 2014-03-30 LAB — CBG MONITORING, ED
GLUCOSE-CAPILLARY: 84 mg/dL (ref 70–99)
Glucose-Capillary: 72 mg/dL (ref 70–99)

## 2014-03-30 LAB — I-STAT CHEM 8, ED
BUN: 14 mg/dL (ref 6–23)
CALCIUM ION: 1.16 mmol/L (ref 1.13–1.30)
Chloride: 98 mEq/L (ref 96–112)
Creatinine, Ser: 1 mg/dL (ref 0.50–1.35)
Glucose, Bld: 236 mg/dL — ABNORMAL HIGH (ref 70–99)
HCT: 56 % — ABNORMAL HIGH (ref 39.0–52.0)
HEMOGLOBIN: 19 g/dL — AB (ref 13.0–17.0)
Potassium: 3.3 mEq/L — ABNORMAL LOW (ref 3.7–5.3)
SODIUM: 143 meq/L (ref 137–147)
TCO2: 22 mmol/L (ref 0–100)

## 2014-03-30 LAB — VALPROIC ACID LEVEL: VALPROIC ACID LVL: 85.7 ug/mL (ref 50.0–100.0)

## 2014-03-30 MED ORDER — DOCUSATE SODIUM 100 MG PO CAPS: 100.0000 mg | ORAL_CAPSULE | Freq: Two times a day (BID) | ORAL | Status: DC | PRN

## 2014-03-30 MED ORDER — OXYCODONE-ACETAMINOPHEN 5-325 MG PO TABS: 1.0000 | ORAL_TABLET | Freq: Four times a day (QID) | ORAL | Status: DC | PRN

## 2014-03-30 MED ORDER — DIVALPROEX SODIUM 250 MG PO DR TAB
500.0000 mg | DELAYED_RELEASE_TABLET | Freq: Once | ORAL | Status: AC
  Administered 2014-03-30: 500 mg via ORAL
  Filled 2014-03-30: qty 2

## 2014-03-30 MED ORDER — OXYCODONE-ACETAMINOPHEN 5-325 MG PO TABS
1.0000 | ORAL_TABLET | Freq: Once | ORAL | Status: AC
  Administered 2014-03-30: 1 via ORAL
  Filled 2014-03-30: qty 1

## 2014-03-30 MED ORDER — MORPHINE SULFATE 4 MG/ML IJ SOLN
4.0000 mg | Freq: Once | INTRAMUSCULAR | Status: DC
  Filled 2014-03-30: qty 1

## 2014-03-30 NOTE — ED Notes (Signed)
Patient family heard him fall, went into the room and found patient drooling.  Patient was confused post fall/seizure.  He is now alert and oriented at baseline.  Family concerned due to injury to the right cheek, nose bleed, and spitting up blood.  He also has lac to the right hand.  Patient is on depakote.  Med was changed to generic 1 weeks ago.  Family concerned that this med change may have caused his seizure.  No recent illness.

## 2014-03-30 NOTE — ED Provider Notes (Signed)
CSN: [id number]     Arrival date & time 03/30/14  1356 History   First MD Initiated Contact with Patient 03/30/14 1747     Chief Complaint  Patient presents with  . Seizures  . Fall     (Consider location/radiation/quality/duration/timing/severity/associated sxs/prior Treatment) Patient is a 63 y.o. male presenting with seizures and fall. The history is provided by the patient, the spouse and a relative.  Seizures Seizure activity on arrival: no   Episode characteristics: abnormal movements and confusion   Postictal symptoms: confusion   Return to baseline: yes   Severity:  Moderate Timing:  Once Progression:  Resolved Context: not alcohol withdrawal, not change in medication, not sleeping less, not drug use, not fever and not previous head injury   Recent head injury:  No recent head injuries History of seizures: yes   Fall Pertinent negatives include no abdominal pain, chest pain, chills, congestion, coughing, fever, headaches, nausea, numbness, rash, vomiting or weakness.    63yo male presents sp seizure. H/o TBI with resultant seizures. On depakote. Compliant. Recently changed to generic. Today in other room. Wife heard loud sound. Found patient on ground next to desk. Abrasion to right face. Drooling. Appearing to have sz. Similar to prior. Mild movement of arms and legs, but without aggressive shaking (usually does not have significant movement). Has about 20 sz every month. Present to ED not because of sz but because of concern of trauma.  Family concerned about patient spitting up blood 3-4x.  Mild confusion afterwards that has since resolved. tdap UTD per family.  Patient endorses mild headache. No numbness/tingling/weakness.   Past Medical History  Diagnosis Date  . Seizure disorder   . Nausea   . History of heartburn   . Chronic back pain   . IHD (ischemic heart disease)   . Cigarette smoker   . Hypercholesterolemia   . CHF (congestive heart failure)     EF had  been 20% initially. 2010 EF 35-40%.  . Traumatic brain injury   . Seizures    Past Surgical History  Procedure Laterality Date  . Cardiac catheterization  03/12/2007  . Coronary artery bypass graft  2000    x6, graft to the PDA and LAD, patent saphenous vein graft to acute marginal and posterior lateral severely diseased saphenous vein graft to diagonal. Patent LIMA to the LAD.  Marland Kitchen. Shoulder surgery      right  . Facial reconstruction surgery    . Back surgery     Family History  Problem Relation Age of Onset  . Heart disease Brother   . Heart disease Sister   . Coronary artery disease Brother   . Coronary artery disease Sister   . Diabetes Brother   . Thyroid disease Brother   . Heart attack Brother    History  Substance Use Topics  . Smoking status: Current Every Day Smoker -- 0.50 packs/day for 50 years    Types: Cigarettes  . Smokeless tobacco: Never Used  . Alcohol Use: No    Review of Systems  Constitutional: Negative for fever and chills.  HENT: Negative for congestion and rhinorrhea.   Respiratory: Negative for cough and shortness of breath.   Cardiovascular: Negative for chest pain and leg swelling.  Gastrointestinal: Negative for nausea, vomiting and abdominal pain.  Genitourinary: Negative for flank pain and difficulty urinating.  Musculoskeletal: Negative for back pain.  Skin: Positive for wound (abrasion to right hand.). Negative for color change and rash.  Neurological: Positive for  seizures. Negative for dizziness, weakness, light-headedness, numbness and headaches.  All other systems reviewed and are negative.     Allergies  Aspirin  Home Medications   Prior to Admission medications   Medication Sig Start Date End Date Taking? Authorizing Provider  aspirin 325 MG tablet Take 325 mg by mouth daily.     Yes Historical Provider, MD  butalbital-acetaminophen-caffeine (FIORICET) 50-325-40 MG per tablet 3 (three) times daily.    Yes Historical Provider,  MD  carvedilol (COREG) 12.5 MG tablet Take 12.5 mg by mouth 2 (two) times daily with a meal.   Yes Historical Provider, MD  divalproex (DEPAKOTE) 500 MG EC tablet Take 500 mg by mouth 3 (three) times daily. Prescribed by  his neurologist in [facility]   Yes Historical Provider, MD  furosemide (LASIX) 40 MG tablet TAKE ONE TABLET BY MOUTH EVERY OTHER DAY 02/09/14  Yes Rollene Rotunda, MD  lisinopril (PRINIVIL,ZESTRIL) 5 MG tablet Take 1 tablet (5 mg total) by mouth daily. 02/19/13  Yes Rollene Rotunda, MD  pravastatin (PRAVACHOL) 40 MG tablet Take 1 tablet (40 mg total) by mouth daily. 02/19/13  Yes Rollene Rotunda, MD  ranitidine (ZANTAC) 150 MG capsule Take 150 mg by mouth 2 (two) times daily.   Yes Historical Provider, MD  docusate sodium (COLACE) 100 MG capsule Take 1 capsule (100 mg total) by mouth 2 (two) times daily as needed for mild constipation. 03/30/14   Stevie Kern, MD  oxyCODONE-acetaminophen (PERCOCET/ROXICET) 5-325 MG per tablet Take 1 tablet by mouth every 6 (six) hours as needed for severe pain. 03/30/14   Stevie Kern, MD   BP 116/68  Pulse 62  Temp(Src) 98 F (36.7 C) (Oral)  Resp 11  Wt 154 lb (69.854 kg)  SpO2 100% Physical Exam  Nursing note and vitals reviewed. Constitutional: He is oriented to person, place, and time. He appears well-developed and well-nourished. No distress.  HENT:  Head: Normocephalic. Head is without raccoon's eyes, without Battle's sign, without contusion, without right periorbital erythema and without left periorbital erythema.  Right Ear: Tympanic membrane and external ear normal.  Left Ear: Tympanic membrane and external ear normal.  Nose: No nose lacerations, sinus tenderness, septal deviation or nasal septal hematoma.  Mouth/Throat: Uvula is midline. No trismus in the jaw. No oropharyngeal exudate.  Bruising/abrasion to right cheek. Small hemostatic laceration to right cheek. No malocclusion.  Eyes: Conjunctivae and EOM are normal. Pupils are  equal, round, and reactive to light. Right eye exhibits no discharge. Left eye exhibits no discharge.  Neck: No tracheal deviation present.  Cardiovascular: Normal rate, regular rhythm, normal heart sounds and intact distal pulses.   Pulmonary/Chest: Effort normal and breath sounds normal. No stridor. No respiratory distress. He has no wheezes. He has no rales. He exhibits tenderness (mild right lateral chest).  Abdominal: Soft. He exhibits no distension. There is no tenderness. There is no guarding.  Musculoskeletal: He exhibits no edema and no tenderness.  Small abrasion to dorsum of right hand and forearm. Hemostatic. No lacs.   Neurological: He is alert and oriented to person, place, and time. He has normal strength. No cranial nerve deficit or sensory deficit. Coordination normal. GCS eye subscore is 4. GCS verbal subscore is 5. GCS motor subscore is 6.  Skin: Skin is warm and dry.  Psychiatric: He has a normal mood and affect. His behavior is normal.    ED Course  Procedures (including critical care time) Labs Review Labs Reviewed  I-STAT CHEM 8, ED -  Abnormal; Notable for the following:    Potassium 3.3 (*)    Glucose, Bld 236 (*)    Hemoglobin 19.0 (*)    HCT 56.0 (*)    All other components within normal limits  VALPROIC ACID LEVEL  CBG MONITORING, ED  CBG MONITORING, ED    Imaging Review Dg Ribs Unilateral W/chest Right  03/30/2014   CLINICAL DATA:  Fall.  Right-sided chest pain.  EXAM: RIGHT RIBS AND CHEST - 3+ VIEW  COMPARISON:  03/08/2007  FINDINGS: There has been previous median sternotomy and CABG. Heart size is normal. The lungs are clear. No pneumothorax or hemothorax. There is bullous emphysema at the apices, more extensive on the right than the left.  There is no visible rib fracture. The pain marker is anterior, projected over the costal cartilage region.  IMPRESSION: No visible rib fracture. The pain marker overlies the anterior costal cartilage.  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   Electronically Signed   By: Paulina Fusi M.D.   On: 03/30/2014 20:05   Ct Head Wo Contrast  03/30/2014   CLINICAL DATA:  Fall, seizure, right cheek injury, epistaxis  EXAM: CT HEAD WITHOUT CONTRAST  CT MAXILLOFACIAL WITHOUT CONTRAST  CT CERVICAL SPINE WITHOUT CONTRAST  TECHNIQUE: Multidetector CT imaging of the head, cervical spine, and maxillofacial structures were performed using the standard protocol without intravenous contrast. Multiplanar CT image reconstructions of the cervical spine and maxillofacial structures were also generated.  COMPARISON:  CT head dated 04/13/2008  FINDINGS: CT HEAD FINDINGS  No evidence of parenchymal hemorrhage or extra-axial fluid collection. No mass lesion, mass effect, or midline shift.  No CT evidence of acute infarction.  Subcortical white matter and periventricular small vessel ischemic changes. Intracranial atherosclerosis.  Mild global cortical atrophy.  No ventriculomegaly.  Partial opacification of the right maxillary, ethmoid, and sphenoid sinuses. Minimal partial opacification of the left mastoid air cells (series 3/image 12), chronic.  Fractures involving the lateral wall of the right orbit (series 3/images 78).  CT MAXILLOFACIAL FINDINGS  Fractures involving the lateral wall of the right orbit (series 5/ images 61 and 57). Inferior orbital rim appears intact.  Bilateral orbits, including the globes and retroconal soft tissues, appear intact.  Fractures involving the lateral wall of the right maxillary sinus (series 10/ image 41). Additional fractures posteriorly (series 5/images 49 and 51).  Hemorrhage within the right maxillary sinus (series 4/ image 44). Partial opacification/hemorrhage within the right ethmoid and sphenoid sinuses.  Bilateral mandibular condyles appear well seated in the temporomandibular joints.  Leftward bowing of the anterior nasal septum, without nasal bone fracture or nasal septal hematoma.  CT CERVICAL  SPINE FINDINGS  Normal cervical lordosis.  No evidence of fracture or dislocation. Vertebral body heights are maintained. Dens appears intact.  Rotation of C1 on C2, likely positional.  No prevertebral soft tissue swelling.  Mild to moderate multilevel degenerative changes, most prominent at C5-6.  Visualized thyroid is unremarkable.  Visualized lung apices are notable for paraseptal emphysematous changes, including dominant bullous changes at the right lung apex.  IMPRESSION: No evidence of acute intracranial abnormality.  Fractures involving the lateral right orbital wall. Inferior orbital wall remains intact. Retroconal soft tissues are within normal limits.  Fractures involving the lateral wall of the right maxillary sinus. Associated hemorrhage/partial opacification of the right maxillary, ethmoid, and sphenoid sinuses.  No evidence of traumatic injury to the cervical spine.   Electronically Signed   By: Roselie Awkward.D.  On: 03/30/2014 20:15   Ct Cervical Spine Wo Contrast  03/30/2014   CLINICAL DATA:  Fall, seizure, right cheek injury, epistaxis  EXAM: CT HEAD WITHOUT CONTRAST  CT MAXILLOFACIAL WITHOUT CONTRAST  CT CERVICAL SPINE WITHOUT CONTRAST  TECHNIQUE: Multidetector CT imaging of the head, cervical spine, and maxillofacial structures were performed using the standard protocol without intravenous contrast. Multiplanar CT image reconstructions of the cervical spine and maxillofacial structures were also generated.  COMPARISON:  CT head dated 04/13/2008  FINDINGS: CT HEAD FINDINGS  No evidence of parenchymal hemorrhage or extra-axial fluid collection. No mass lesion, mass effect, or midline shift.  No CT evidence of acute infarction.  Subcortical white matter and periventricular small vessel ischemic changes. Intracranial atherosclerosis.  Mild global cortical atrophy.  No ventriculomegaly.  Partial opacification of the right maxillary, ethmoid, and sphenoid sinuses. Minimal partial opacification  of the left mastoid air cells (series 3/image 12), chronic.  Fractures involving the lateral wall of the right orbit (series 3/images 78).  CT MAXILLOFACIAL FINDINGS  Fractures involving the lateral wall of the right orbit (series 5/ images 61 and 57). Inferior orbital rim appears intact.  Bilateral orbits, including the globes and retroconal soft tissues, appear intact.  Fractures involving the lateral wall of the right maxillary sinus (series 10/ image 41). Additional fractures posteriorly (series 5/images 49 and 51).  Hemorrhage within the right maxillary sinus (series 4/ image 44). Partial opacification/hemorrhage within the right ethmoid and sphenoid sinuses.  Bilateral mandibular condyles appear well seated in the temporomandibular joints.  Leftward bowing of the anterior nasal septum, without nasal bone fracture or nasal septal hematoma.  CT CERVICAL SPINE FINDINGS  Normal cervical lordosis.  No evidence of fracture or dislocation. Vertebral body heights are maintained. Dens appears intact.  Rotation of C1 on C2, likely positional.  No prevertebral soft tissue swelling.  Mild to moderate multilevel degenerative changes, most prominent at C5-6.  Visualized thyroid is unremarkable.  Visualized lung apices are notable for paraseptal emphysematous changes, including dominant bullous changes at the right lung apex.  IMPRESSION: No evidence of acute intracranial abnormality.  Fractures involving the lateral right orbital wall. Inferior orbital wall remains intact. Retroconal soft tissues are within normal limits.  Fractures involving the lateral wall of the right maxillary sinus. Associated hemorrhage/partial opacification of the right maxillary, ethmoid, and sphenoid sinuses.  No evidence of traumatic injury to the cervical spine.   Electronically Signed   By: Charline Bills M.D.   On: 03/30/2014 20:15   Ct Maxillofacial Wo Cm  03/30/2014   CLINICAL DATA:  Fall, seizure, right cheek injury, epistaxis  EXAM:  CT HEAD WITHOUT CONTRAST  CT MAXILLOFACIAL WITHOUT CONTRAST  CT CERVICAL SPINE WITHOUT CONTRAST  TECHNIQUE: Multidetector CT imaging of the head, cervical spine, and maxillofacial structures were performed using the standard protocol without intravenous contrast. Multiplanar CT image reconstructions of the cervical spine and maxillofacial structures were also generated.  COMPARISON:  CT head dated 04/13/2008  FINDINGS: CT HEAD FINDINGS  No evidence of parenchymal hemorrhage or extra-axial fluid collection. No mass lesion, mass effect, or midline shift.  No CT evidence of acute infarction.  Subcortical white matter and periventricular small vessel ischemic changes. Intracranial atherosclerosis.  Mild global cortical atrophy.  No ventriculomegaly.  Partial opacification of the right maxillary, ethmoid, and sphenoid sinuses. Minimal partial opacification of the left mastoid air cells (series 3/image 12), chronic.  Fractures involving the lateral wall of the right orbit (series 3/images 78).  CT MAXILLOFACIAL FINDINGS  Fractures  involving the lateral wall of the right orbit (series 5/ images 61 and 57). Inferior orbital rim appears intact.  Bilateral orbits, including the globes and retroconal soft tissues, appear intact.  Fractures involving the lateral wall of the right maxillary sinus (series 10/ image 41). Additional fractures posteriorly (series 5/images 49 and 51).  Hemorrhage within the right maxillary sinus (series 4/ image 44). Partial opacification/hemorrhage within the right ethmoid and sphenoid sinuses.  Bilateral mandibular condyles appear well seated in the temporomandibular joints.  Leftward bowing of the anterior nasal septum, without nasal bone fracture or nasal septal hematoma.  CT CERVICAL SPINE FINDINGS  Normal cervical lordosis.  No evidence of fracture or dislocation. Vertebral body heights are maintained. Dens appears intact.  Rotation of C1 on C2, likely positional.  No prevertebral soft tissue  swelling.  Mild to moderate multilevel degenerative changes, most prominent at C5-6.  Visualized thyroid is unremarkable.  Visualized lung apices are notable for paraseptal emphysematous changes, including dominant bullous changes at the right lung apex.  IMPRESSION: No evidence of acute intracranial abnormality.  Fractures involving the lateral right orbital wall. Inferior orbital wall remains intact. Retroconal soft tissues are within normal limits.  Fractures involving the lateral wall of the right maxillary sinus. Associated hemorrhage/partial opacification of the right maxillary, ethmoid, and sphenoid sinuses.  No evidence of traumatic injury to the cervical spine.   Electronically Signed   By: Charline Bills M.D.   On: 03/30/2014 20:15     EKG Interpretation None      MDM   Final diagnoses:  Fall  Seizure  Multiple facial fractures    Seizure with fall.  H/o seizures. Level wnl. Family and patient not concerned about sz's as this happens frequently and this not atypical. Without infectious complaints. No fevers. Do not suspect meningitis. Ct scans with facial fracture. EOMI and without evidence of entrapment. F/u face trauma and ophthalmology. No nose blowing. Nasal spray prn.   Patient discharged home. Return precautions given. To follow up as above. Patient and family in agreement with plan.   Discussed case with Dr. Rubin Payor who is in agreement with assessment and plan.      Stevie Kern, MD 03/30/14 [phone]

## 2014-03-30 NOTE — ED Notes (Signed)
CBG 72  

## 2014-03-30 NOTE — ED Notes (Signed)
CBG 84. 

## 2014-03-30 NOTE — ED Notes (Signed)
Patient transported to CT 

## 2014-03-30 NOTE — Discharge Instructions (Signed)
Do not blow your nose.  Eat soft foods.    Epilepsy People with epilepsy have times when they shake and jerk uncontrollably (seizures). This happens when there is a sudden change in brain function. Epilepsy may have many possible causes. Anything that disturbs the normal pattern of brain cell activity can lead to seizures. HOME CARE   Follow your doctor's instructions about driving and safety during normal activities.  Get enough sleep.  Only take medicine as told by your doctor.  Avoid things that you know can cause you to have seizures (triggers).  Write down when your seizures happen and what you remember about each seizure. Write down anything you think may have caused the seizure to happen.  Tell the people you live and work with that you have seizures. Make sure they know how to help you. They should:  Cushion your head and body.  Turn you on your side.  Not restrain you.  Not place anything inside your mouth.  Call for local emergency medical help if there is any question about what has happened.  Keep all follow-up visits with your doctor. This is very important. GET HELP IF:  You get an infection or start to feel sick. You may have more seizures when you are sick.  You are having seizures more often.  Your seizure pattern is changing. GET HELP RIGHT AWAY IF:   A seizure does not stop after a few seconds or minutes.  A seizure causes you to have trouble breathing.  A seizure gives you a very bad headache.  A seizure makes you unable to speak or use a part of your body. Document Released: 08/06/2009 Document Revised: 07/30/2013 Document Reviewed: 05/21/2013 [facility] Patient Information 2014 [place], [place].  Facial Fracture A facial fracture is a break in one of the bones of your face. HOME CARE INSTRUCTIONS   Protect the injured part of your face until it is healed.  Do not participate in activities which give chance for re-injury until your doctor  approves.  Gently wash and dry your face.  Wear head and facial protection while riding a bicycle, motorcycle, or snowmobile. SEEK MEDICAL CARE IF:   An oral temperature above 102 F (38.9 C) develops.  You have severe headaches or notice changes in your vision.  You have new numbness or tingling in your face.  You develop nausea (feeling sick to your stomach), vomiting or a stiff neck. SEEK IMMEDIATE MEDICAL CARE IF:   You develop difficulty seeing or experience double vision.  You become dizzy, lightheaded, or faint.  You develop trouble speaking, breathing, or swallowing.  You have a watery discharge from your nose or ear. MAKE SURE YOU:   Understand these instructions.  Will watch your condition.  Will get help right away if you are not doing well or get worse. Document Released: 10/09/2005 Document Revised: 01/01/2012 Document Reviewed: 05/28/2008 [facility] Patient Information 2014 [place], [place].

## 2014-03-30 NOTE — ED Notes (Signed)
Pt does not recall having a seizure this am. Wife reports pt getting up from kitchen and going to the computer room. Wife found pt on the floor, drooling, with blood coming out of nose and right cheek. Daughter reports that pt has had increased seizure activity since his depakote was changed to generic form a week ago Thursday. Pt alert and oriented x 4, neuro intact at this time. C/o of right cheek, right eye and back of head pain.

## 2014-03-31 NOTE — ED Provider Notes (Signed)
I saw and evaluated the patient, reviewed the resident's note and I agree with the findings and plan.   EKG Interpretation None     Patient with seizure. History same. Antiepileptic levels are good. He has facial fractures will need followup but there is no entrapment. Patient is returned to his baseline will be discharged home  Juliet Rude. Rubin Payor, MD 03/31/14 [phone]

## 2014-04-06 ENCOUNTER — Other Ambulatory Visit: Payer: Self-pay | Admitting: Cardiology

## 2014-04-06 ENCOUNTER — Telehealth: Payer: Self-pay | Admitting: Cardiology

## 2014-04-06 NOTE — Telephone Encounter (Signed)
Spoke with pt who states he had a recent fall after "blacking out" that led to facial fractures.  He reports to he told his BP was very low.  He is going to keep a record of them and will be seen on Friday by Dr Antoine PocheHochrein for further evaluation.  He is to call back if he becomes lightheaded or syncopal before then.

## 2014-04-06 NOTE — Telephone Encounter (Signed)
New message     Pt fainted last Tuesday and went to the ER.  He said broke 2 bones in his face.  His bp is unstable and this may have been the reason he fainted.  Pt would like to talk to a nurse.

## 2014-04-10 ENCOUNTER — Ambulatory Visit (INDEPENDENT_AMBULATORY_CARE_PROVIDER_SITE_OTHER): Payer: Medicare Other | Admitting: Cardiology

## 2014-04-10 ENCOUNTER — Encounter: Payer: Self-pay | Admitting: Cardiology

## 2014-04-10 VITALS — BP 95/56 | HR 56 | Ht 72.0 in | Wt 148.0 lb

## 2014-04-10 DIAGNOSIS — I5022 Chronic systolic (congestive) heart failure: Secondary | ICD-10-CM

## 2014-04-10 DIAGNOSIS — I951 Orthostatic hypotension: Secondary | ICD-10-CM

## 2014-04-10 DIAGNOSIS — I2510 Atherosclerotic heart disease of native coronary artery without angina pectoris: Secondary | ICD-10-CM

## 2014-04-10 MED ORDER — CARVEDILOL 6.25 MG PO TABS: 6.2500 mg | ORAL_TABLET | Freq: Two times a day (BID) | ORAL | Status: DC

## 2014-04-10 NOTE — Progress Notes (Signed)
HPI The patient presents for followup of coronary disease and ischemic cardiomyopathy.  He was referred to EP to consider an ICD in the past.  However, he cancelled this appt and does not want to consider this.  He now returns after being in the emergency room after a seizure versus syncopal episode. It was listed as a seizure by the ER doctor but he tells me a neurologist did not think that he had had a seizure. I don't see a neurology evaluation. However, he's clearly having orthostatic symptoms. He doesn't remember the event other than that he had gotten up to walk across his room to do something. His wife came in and found him drooling but not with seizure activity.  He has since had symptoms of lightheadedness and orthostasis with standing up. He's had some very low blood pressures with the lowest recorded at 68/47. He did have a facial fracture and has discomfort related to this. I did review the emergency room records. He denies any chest pressure, neck or arm. There is no new shortness of breath, PND or orthopnea.  Allergies  Allergen Reactions  . Aspirin     To much aspirin make his bleed on the inside can take a small amount.    Current Outpatient Prescriptions  Medication Sig Dispense Refill  . amoxicillin (AMOXIL) 500 MG capsule Pt has 1 more pill left 04/10/2014      . aspirin 325 MG tablet Take 325 mg by mouth daily.        . butalbital-acetaminophen-caffeine (FIORICET) 50-325-40 MG per tablet 3 (three) times daily.       . carvedilol (COREG) 12.5 MG tablet Take 12.5 mg by mouth 2 (two) times daily with a meal.      . divalproex (DEPAKOTE) 500 MG EC tablet Take 500 mg by mouth 3 (three) times daily. Prescribed by  [place] neurologist in [place]      . docusate sodium (COLACE) 100 MG capsule Take 1 capsule (100 mg total) by mouth 2 (two) times daily as needed for mild constipation.  30 capsule  0  . furosemide (LASIX) 40 MG tablet TAKE ONE TABLET BY MOUTH EVERY OTHER DAY  30 tablet   5  . lisinopril (PRINIVIL,ZESTRIL) 5 MG tablet Take 1 tablet (5 mg total) by mouth daily.  90 tablet  3  . oxyCODONE-acetaminophen (PERCOCET/ROXICET) 5-325 MG per tablet Take 1 tablet by mouth every 6 (six) hours as needed for severe pain.  15 tablet  0  . pravastatin (PRAVACHOL) 40 MG tablet Take 1 tablet (40 mg total) by mouth daily.  90 tablet  3  . ranitidine (ZANTAC) 150 MG capsule Take 150 mg by mouth 2 (two) times daily.       No current facility-administered medications for this visit.    Past Medical History  Diagnosis Date  . Seizure disorder   . Nausea   . History of heartburn   . Chronic back pain   . IHD (ischemic heart disease)   . Cigarette smoker   . Hypercholesterolemia   . CHF (congestive heart failure)     EF had been 20% initially. 2010 EF 35-40%.  . Traumatic brain injury   . Seizures     Past Surgical History  Procedure Laterality Date  . Cardiac catheterization  03/12/2007  . Coronary artery bypass graft  2000    x6, graft to the PDA and LAD, patent saphenous vein graft to acute marginal and posterior lateral severely diseased saphenous  vein graft to diagonal. Patent LIMA to the LAD.  Marland Kitchen. Shoulder surgery      right  . Facial reconstruction surgery    . Back surgery      ROS:  As stated in the HPI and negative for all other systems.  PHYSICAL EXAM BP 95/56  Pulse 56  Ht 6' (1.829 m)  Wt 148 lb (67.132 kg)  BMI 20.07 kg/m2 GENERAL:  Frail appearing HEENT:  Pupils equal round and reactive, fundi not visualized, oral mucosa unremarkable, bruising on the right side of his face NECK:  No jugular venous distention, waveform within normal limits, carotid upstroke brisk and symmetric, no bruits, no thyromegaly LUNGS:  Clear to auscultation bilaterally BACK:  No CVA tenderness, lordosis CHEST:  Well healed sternotomy scar, decreased breath sounds.   HEART:  PMI not displaced or sustained,S1 and S2 within normal limits, no S3, no S4, no clicks, no rubs, no  murmurs ABD:  Flat, positive bowel sounds normal in frequency in pitch, no bruits, no rebound, no guarding, no midline pulsatile mass, no hepatomegaly, no splenomegaly EXT:  2 plus pulses throughout, mild ankle edema, no cyanosis no clubbing  ASSESSMENT AND PLAN  CHRONIC SYSTOLIC HEART FAILURE -  Given his orthostasis and other symptoms I will need to cut back on his carvedilol to 6.25 mg twice daily. We discussed conservative therapies to avoid any further events.  CORONARY, ARTERIOSCLEROSIS -  The patient has no new sypmtoms. No further cardiovascular testing is indicated. We will continue with aggressive risk reduction and meds as listed.   TOBACCO DEPENDENCE -  He has no plan or desire to quit smoking.  CONVULSIONS, SEIZURES, -  He has seizures 20 per month and this is baseline.  [facility] and recently had an evaluation.

## 2014-04-10 NOTE — Patient Instructions (Signed)
Please decrease your carvedilol to 6.25 mg one twice a day. Continue all other medications as listed.  Follow up in 6 weeks with a NP/PA.

## 2014-05-13 ENCOUNTER — Telehealth: Payer: Self-pay | Admitting: Cardiology

## 2014-05-13 NOTE — Telephone Encounter (Signed)
[name] is calling in reference to having his pravastatin and lisinopril refilled.  [facility] pharmacy on [address] states that they have sent the request for a refill . Please call  Thanks

## 2014-05-14 MED ORDER — LISINOPRIL 5 MG PO TABS: 5.0000 mg | ORAL_TABLET | Freq: Every day | ORAL | Status: DC

## 2014-05-14 MED ORDER — PRAVASTATIN SODIUM 40 MG PO TABS: 40.0000 mg | ORAL_TABLET | Freq: Every day | ORAL | Status: AC

## 2014-05-14 NOTE — Telephone Encounter (Signed)
Rx was sent to pharmacy electronically. 

## 2014-05-14 NOTE — Telephone Encounter (Signed)
PT says his medicine have not been called in,please call it in today. Would you please let him know when you call it in.

## 2014-05-22 ENCOUNTER — Ambulatory Visit: Payer: Medicare Other | Admitting: Physician Assistant

## 2014-08-27 ENCOUNTER — Telehealth: Payer: Self-pay

## 2014-08-27 NOTE — Telephone Encounter (Signed)
Received surgical clearance form from [facility].Dr.Hochrein cleared patient for upcoming surgery.Form faxed back to fax # [phone].

## 2014-11-10 ENCOUNTER — Encounter: Payer: Self-pay | Admitting: Family Medicine

## 2014-11-10 ENCOUNTER — Ambulatory Visit (INDEPENDENT_AMBULATORY_CARE_PROVIDER_SITE_OTHER): Payer: Medicare Other | Admitting: Family Medicine

## 2014-11-10 VITALS — BP 104/62 | HR 64 | Temp 97.6°F | Ht 72.0 in | Wt 155.0 lb

## 2014-11-10 DIAGNOSIS — I509 Heart failure, unspecified: Secondary | ICD-10-CM

## 2014-11-10 DIAGNOSIS — G40909 Epilepsy, unspecified, not intractable, without status epilepticus: Secondary | ICD-10-CM

## 2014-11-10 DIAGNOSIS — I714 Abdominal aortic aneurysm, without rupture, unspecified: Secondary | ICD-10-CM

## 2014-11-10 DIAGNOSIS — Z951 Presence of aortocoronary bypass graft: Secondary | ICD-10-CM | POA: Insufficient documentation

## 2014-11-10 DIAGNOSIS — R197 Diarrhea, unspecified: Secondary | ICD-10-CM

## 2014-11-10 DIAGNOSIS — I2510 Atherosclerotic heart disease of native coronary artery without angina pectoris: Secondary | ICD-10-CM

## 2014-11-10 NOTE — Progress Notes (Signed)
   Subjective:    Patient ID: [name], male    DOB: 05-Dec-1950, 64 y.o.   MRN: [id number]  HPI 64 year old male with a PMH of CAD s/p CABG and tobacco abuse presents for a same day appointment with complaints of diarrhea.  1) Diarrhea  Patient reports that he has been experiencing diarrhea for the past 5-6 days.  He reports that he had some vomiting a few days ago (nonbilious and nonbloody).  This has now resolved.  He reports that he had been experiencing decreased appetite, and some mild abdominal discomfort.    Additionally, he has had some heartburn, bloating and belching.  He has been taking imodium w/o relief in diarrhea.  Diarrhea has been frequent enough to cause incontinence.  No recent fever, chills.  No current nausea. No current abdominal pain.  He reports good fluid intake.  Review of Systems Per HPI    Objective:   Physical Exam Filed Vitals:   11/10/14 1546  BP: 104/62  Pulse: 64  Temp: 97.6 F (36.4 C)   Exam: General:  Chronically ill appearing male in NAD.  HEENT: NCAT. Dry mucous membranes. Cardiovascular: RRR. No murmurs, rubs, or gallops. Respiratory: CTAB. No rales, rhonchi, or wheeze. Abdomen: soft, flat, nontender, nondistended. Palpable pulsatile mass noted in the midline.    Assessment & Plan:  See Problem List

## 2014-11-10 NOTE — Patient Instructions (Signed)
It was nice to see you today.  I'm obtaining labs for further workup. You can continue to use the Imodium as needed. If her symptoms do not resolve in the next few days please follow-up as stool studies may be needed.  We are trying to set up an ultrasound for you as well. We will be in contact.  Take care  Dr. Adriana Simasook

## 2014-11-11 ENCOUNTER — Telehealth: Payer: Self-pay | Admitting: Family Medicine

## 2014-11-11 DIAGNOSIS — I714 Abdominal aortic aneurysm, without rupture, unspecified: Secondary | ICD-10-CM | POA: Insufficient documentation

## 2014-11-11 DIAGNOSIS — R197 Diarrhea, unspecified: Secondary | ICD-10-CM | POA: Insufficient documentation

## 2014-11-11 LAB — VALPROIC ACID LEVEL: Valproic Acid Lvl: 110 ug/mL — ABNORMAL HIGH (ref 50.0–100.0)

## 2014-11-11 MED ORDER — ONDANSETRON HCL 4 MG PO TABS: 4.0000 mg | ORAL_TABLET | Freq: Three times a day (TID) | ORAL | Status: DC | PRN

## 2014-11-11 NOTE — Telephone Encounter (Signed)
Pt called and needs a refill on his Amoxicillin called in to his pharmacy. jw

## 2014-11-11 NOTE — Assessment & Plan Note (Signed)
Noted on exam today. Will send for US ASAP.

## 2014-11-11 NOTE — Assessment & Plan Note (Addendum)
Exam unremarkable other than pulsatile mass noted (AAA; see separate problem list).  No abdominal pain, rebound or guarding. No current nausea, vomiting; Patient able to take PO. No recent hospitalization/antibiotic use to suggest C diff.  Obtaining CBC and CMP today.  Also obtaining valproic acid level as supratherapeutic levels could be contributing to his symptoms. Advised symptomatic care and follow-up with this is not resolved in the near future.

## 2014-11-11 NOTE — Addendum Note (Signed)
Addended by: Tommie SamsOOK, Hamed Debella G on: 11/11/2014 01:23 PM   Modules accepted: Level of Service

## 2014-11-11 NOTE — Telephone Encounter (Signed)
Unsure why pt is on amoxicillin and if needs, especially with diarrhea, he will need appointment.   Thanks [employer] CorporationBryan R. Paulina FusiHess, DO [facility] 11/11/2014, 1:28 PM

## 2014-11-12 NOTE — Telephone Encounter (Signed)
Spoke with pt and there must have been some confusion in the medications he was going to be prescribed at last visit (11-10-2014).  He stated that amoxicillin was written down on his paper.  I informed him that the only medicine I saw prescribed for that visit was Zofran for nausea and vomiting. Pt understood. [name], [name]

## 2014-11-13 ENCOUNTER — Other Ambulatory Visit: Payer: Medicare Other

## 2014-11-18 ENCOUNTER — Other Ambulatory Visit: Payer: Medicare Other

## 2014-11-26 ENCOUNTER — Telehealth: Payer: Self-pay | Admitting: Family Medicine

## 2014-11-26 DIAGNOSIS — I714 Abdominal aortic aneurysm, without rupture, unspecified: Secondary | ICD-10-CM

## 2014-11-26 NOTE — Telephone Encounter (Signed)
Pt called and needs orders put in again to have his imaging done. The first appointment was snowed out and the second one he was still snowed in. He also would like someone to call about his lab results. jw

## 2014-11-26 NOTE — Telephone Encounter (Signed)
Appointment has been rescheduled for Wednesday 12/02/2014 @ 10:25am. Patient has been notified and agreed to appointment.

## 2014-11-26 NOTE — Telephone Encounter (Signed)
Will forward to Dr. Adriana Simasook as he knows pt better than myself.  Thanks [employer] CorporationBryan R. Paulina FusiHess, DO of [facility] 11/26/2014, 11:47 AM

## 2014-11-26 NOTE — Telephone Encounter (Signed)
Please schedule US for patient.

## 2014-12-02 ENCOUNTER — Ambulatory Visit
Admission: RE | Admit: 2014-12-02 | Discharge: 2014-12-02 | Disposition: A | Discharge status: Home / Self Care | Payer: Medicare Other | Source: Ambulatory Visit | Attending: Family Medicine | Admitting: Family Medicine

## 2014-12-02 ENCOUNTER — Other Ambulatory Visit: Payer: Self-pay | Admitting: Family Medicine

## 2014-12-02 DIAGNOSIS — I714 Abdominal aortic aneurysm, without rupture, unspecified: Secondary | ICD-10-CM

## 2014-12-03 ENCOUNTER — Ambulatory Visit (INDEPENDENT_AMBULATORY_CARE_PROVIDER_SITE_OTHER): Payer: Medicare Other | Admitting: Family Medicine

## 2014-12-03 ENCOUNTER — Encounter: Payer: Self-pay | Admitting: Family Medicine

## 2014-12-03 VITALS — BP 92/59 | HR 66 | Temp 98.4°F | Ht 72.0 in | Wt 154.3 lb

## 2014-12-03 DIAGNOSIS — I714 Abdominal aortic aneurysm, without rupture, unspecified: Secondary | ICD-10-CM

## 2014-12-03 DIAGNOSIS — R6881 Early satiety: Secondary | ICD-10-CM

## 2014-12-03 DIAGNOSIS — R1084 Generalized abdominal pain: Secondary | ICD-10-CM

## 2014-12-03 DIAGNOSIS — R109 Unspecified abdominal pain: Secondary | ICD-10-CM | POA: Insufficient documentation

## 2014-12-03 DIAGNOSIS — I2510 Atherosclerotic heart disease of native coronary artery without angina pectoris: Secondary | ICD-10-CM

## 2014-12-03 DIAGNOSIS — R634 Abnormal weight loss: Secondary | ICD-10-CM

## 2014-12-03 NOTE — Assessment & Plan Note (Signed)
Physical exam unremarkable (other than for AAA). Patient has had slow weight loss upon review of the EMR.  He also has other concerning parts of his history; early satiety, postprandial pain. I am concerned about underlying malignancy vs ischemia.  Precepted with attending Dr. Gwendolyn GrantWalden who felt that ischemia was less likely. Will place referral to GI.

## 2014-12-03 NOTE — Progress Notes (Signed)
   Subjective:    Patient ID: [name], male    DOB: 1950-11-19, 64 y.o.   MRN: [id number]  HPI 64 year old male with CAD s/p CABG, systolic CHF, seizure disorder, and tobacco abuse presents for follow up.  1) AAA  Patient was seen on 1/19 for evaluation of diarrhea.  At that time, an AAA was noted on physical exam.   US was just completed (he was unable to get it done previously due to inclimate weather).  US revealed a 5.1 x 4.7 cm AAA.  2) Abdominal pain  Patient reports that recent diarrhea, nausea, vomiting has resolved.  His prior abdominal pain/discomfort has continued and has been intermittently severe.  He reports it is worse after eating.   He also reports decreased appetite and PO intake.  No recent fever, chills.  No reports of melena/hematochezia.   Review of Systems Per HPI    Objective:   Physical Exam Filed Vitals:   12/03/14 1452  BP: 92/59  Pulse: 66  Temp: 98.4 F (36.9 C)   Exam: General: Chronically ill appearing male in NAD.  Cardiovascular: RRR. No murmurs, rubs, or gallops. Respiratory: CTAB. No rales, rhonchi, or wheeze. Abdomen: soft, flat, nontender, nondistended. Palpable pulsatile mass noted in the midline.    Assessment & Plan:  See Problem List

## 2014-12-03 NOTE — Progress Notes (Signed)
Placed in [facility] workqueue, they will contact patient with appointment

## 2014-12-03 NOTE — Patient Instructions (Signed)
It was nice to see you today.  I have placed referrals to Vascular Surgery and GI.    Follow up closely with your PCP.  Take care  Dr. Adriana Simasook

## 2014-12-03 NOTE — Assessment & Plan Note (Signed)
Referral placed for vascular surgery

## 2014-12-22 ENCOUNTER — Telehealth: Payer: Self-pay | Admitting: Cardiology

## 2014-12-22 NOTE — Telephone Encounter (Signed)
Mr. [name] is calling because he has a question about having some surgery done on his aorta (was told that there is an aneurysm on it  ) Please call

## 2014-12-22 NOTE — Telephone Encounter (Signed)
Spoke w/ patient. He'd been seen by fam medicine in January for evaluation for stomach pain & loose stools that has been ongoing at least since January. Workup included a finding of AAA via US.  They had made referral for consult w/ Dr. Arbie CookeyEarly at [facility]. Apparently, sometime last week patient had cancelled appt w/ Dr. Arbie CookeyEarly because he had wanted to seek Dr. Jenene SlickerHochrein's advisement before this. I communicated the concern for following up on this issue quickly and that it was more appropriate to have the consult @ [facility] done in a timely manner. He voiced general understanding.  He does have appt w/ Dr. Antoine PocheHochrein for end of April.

## 2014-12-24 ENCOUNTER — Telehealth: Payer: Self-pay | Admitting: Cardiology

## 2014-12-24 NOTE — Telephone Encounter (Signed)
Eileen StanfordJenna- Thanks for letting us know about this situation.   I spoke with Mr Rosalia HammersRay this morning and we have rescheduled him for Friday 01/01/15 with Dr Waverly Ferrarihristopher Dickson for an evaluation.

## 2014-12-24 NOTE — Telephone Encounter (Signed)
Pt said he was told to call back on Wednesday if he needed to.He said he talked to a male nurse on Monday.This is in regards to having surgery on an aneurysm in his Aorta.

## 2014-12-24 NOTE — Telephone Encounter (Signed)
Returned call to patient. He states he talked with one of our nurse's on Monday who advised him he needed to reschedule his new patient appointment @ [facility] regarding his abdominal aortic aneurysm. Patient states he tried to call on Monday 3/1 and Tuesday 3/2 and couldn't get an appointment.   Informed patient the cancellation note indicated he personally cancelled the appointment - he states he just wanted Dr. Jenene SlickerHochrein's opinion on the matter.   Will defer to Dr. Antoine PocheHochrein as Lorain ChildesFYI Will defer to [facility] scheduler to contact patient for appointment

## 2014-12-29 ENCOUNTER — Encounter: Payer: Medicare Other | Admitting: Vascular Surgery

## 2014-12-31 ENCOUNTER — Encounter: Payer: Self-pay | Admitting: Vascular Surgery

## 2015-01-01 ENCOUNTER — Ambulatory Visit (INDEPENDENT_AMBULATORY_CARE_PROVIDER_SITE_OTHER): Payer: Medicare Other | Admitting: Vascular Surgery

## 2015-01-01 ENCOUNTER — Encounter: Payer: Self-pay | Admitting: Vascular Surgery

## 2015-01-01 ENCOUNTER — Other Ambulatory Visit: Payer: Self-pay | Admitting: *Deleted

## 2015-01-01 ENCOUNTER — Other Ambulatory Visit: Payer: Self-pay | Admitting: Vascular Surgery

## 2015-01-01 VITALS — BP 96/62 | HR 70 | Ht 72.0 in | Wt 153.8 lb

## 2015-01-01 DIAGNOSIS — I714 Abdominal aortic aneurysm, without rupture, unspecified: Secondary | ICD-10-CM

## 2015-01-01 DIAGNOSIS — I2510 Atherosclerotic heart disease of native coronary artery without angina pectoris: Secondary | ICD-10-CM

## 2015-01-01 LAB — BUN: BUN: 24 mg/dL — ABNORMAL HIGH (ref 6–23)

## 2015-01-01 LAB — CREATININE, SERUM: CREATININE: 1.51 mg/dL — AB (ref 0.50–1.35)

## 2015-01-01 NOTE — Progress Notes (Signed)
Referred by: Briscoe Deutscher, DO [address] [place], [place] [address]  Reason for referral: AAA  History of Present Illness  The patient is a 64 y.o. ([birth year]) male who presents with chief complaint: abdominal pain. His abdominal pain started two months ago and is localized to the right upper quadrant. His pain was worse with eating. He also had diarrhea and was seen by his primary care physician. At that time, a AAA was noted on physical exam and the patient was sent for an abdominal ultrasound. His ultrasound revealed a 5.1 cm AAA and he was referred here to [facility].   Today, he states that his abdominal pain is unchanged. It feels like a constant "gnawing." He denies any further diarrhea and has been able to resume eating normally. He denies any pain with eating. His wife notes an 8 lb weight loss since January. The patient has chronic back pain. He denies any new back pain. He denies any melena or hematochezia. He is a 1/2 pack to 3/4 pack per day smoker.   He has a past medical history of CAD s/p CABG and COPD. He has CHF with an EF of 35-40% (2010). He has hypertension that fluctuates per patient but overall well controlled. He is not on any blood thinners.   Past Medical History  Diagnosis Date  . Seizure disorder   . Nausea   . History of heartburn   . Chronic back pain   . IHD (ischemic heart disease)   . Cigarette smoker   . Hypercholesterolemia   . CHF (congestive heart failure)     EF had been 20% initially. 2010 EF 35-40%.  . Traumatic brain injury   . Seizures   . COPD (chronic obstructive pulmonary disease)     Past Surgical History  Procedure Laterality Date  . Cardiac catheterization  03/12/2007  . Coronary artery bypass graft  2000    x6, graft to the PDA and LAD, patent saphenous vein graft to acute marginal and posterior lateral severely diseased saphenous vein graft to diagonal. Patent LIMA to the LAD.  Marland Kitchen Shoulder surgery      right  . Facial  reconstruction surgery    . Back surgery      History   Social History  . Marital Status: Married    Spouse Name: N/A  . Number of Children: N/A  . Years of Education: N/A   Occupational History  . Not on file.   Social History Main Topics  . Smoking status: Current Every Day Smoker -- 0.50 packs/day for 50 years    Types: Cigarettes  . Smokeless tobacco: Never Used  . Alcohol Use: No  . Drug Use: No  . Sexual Activity: Not on file   Other Topics Concern  . Not on file   Social History Narrative    Family History  Problem Relation Age of Onset  . Heart disease Brother   . Hypertension Brother   . Heart attack Brother   . Heart disease Sister   . Coronary artery disease Brother   . Hypertension Brother   . Heart attack Brother   . Coronary artery disease Sister   . Diabetes Brother   . Thyroid disease Brother   . Heart attack Brother   . Diabetes Father   . Heart disease Father   . Hypertension Father   . Heart attack Father   . Hypertension Mother   . Varicose Veins Mother  Current Outpatient Prescriptions on File Prior to Visit  Medication Sig Dispense Refill  . aspirin 325 MG tablet Take 325 mg by mouth daily.      . butalbital-acetaminophen-caffeine (FIORICET) 50-325-40 MG per tablet 3 (three) times daily.     . carvedilol (COREG) 6.25 MG tablet Take 1 tablet (6.25 mg total) by mouth 2 (two) times daily. 60 tablet 11  . divalproex (DEPAKOTE) 500 MG EC tablet Take 500 mg by mouth 3 (three) times daily. [place] [facility]    . furosemide (LASIX) 40 MG tablet TAKE ONE TABLET BY MOUTH EVERY OTHER DAY 30 tablet 5  . lisinopril (PRINIVIL,ZESTRIL) 5 MG tablet Take 1 tablet (5 mg total) by mouth daily. 90 tablet 3  . pravastatin (PRAVACHOL) 40 MG tablet Take 1 tablet (40 mg total) by mouth daily. 90 tablet 3  . ranitidine (ZANTAC) 150 MG capsule Take 150 mg by mouth 2 (two) times daily.    Marland Kitchen. amoxicillin (AMOXIL) 500 MG capsule Pt  has 1 more pill left 04/10/2014    . docusate sodium (COLACE) 100 MG capsule Take 1 capsule (100 mg total) by mouth 2 (two) times daily as needed for mild constipation. (Patient not taking: Reported on 01/01/2015) 30 capsule 0  . ondansetron (ZOFRAN) 4 MG tablet Take 1 tablet (4 mg total) by mouth every 8 (eight) hours as needed for nausea or vomiting. (Patient not taking: Reported on 01/01/2015) 20 tablet 0  . oxyCODONE-acetaminophen (PERCOCET/ROXICET) 5-325 MG per tablet Take 1 tablet by mouth every 6 (six) hours as needed for severe pain. (Patient not taking: Reported on 01/01/2015) 15 tablet 0   No current facility-administered medications on file prior to visit.    Allergies  Allergen Reactions  . Aspirin     To much aspirin make his bleed on the inside can take a small amount.    REVIEW OF SYSTEMS:  (Positives checked otherwise negative)  CARDIOVASCULAR:  []  chest pain, []  chest pressure, []  palpitations, [x]  shortness of breath when laying flat, []  shortness of breath with exertion,  []  pain in feet when walking, []  pain in feet when laying flat, []  history of blood clot in veins (DVT), []  history of phlebitis, []  swelling in legs, []  varicose veins  PULMONARY:  []  productive cough, []  asthma, []  wheezing  NEUROLOGIC:  []  weakness in arms or legs, []  numbness in arms or legs, []  difficulty speaking or slurred speech, []  temporary loss of vision in one eye, []  dizziness  HEMATOLOGIC:  []  bleeding problems, []  problems with blood clotting too easily  MUSCULOSKEL:  [x]  joint pain, []  joint swelling  GASTROINTEST:  []  vomiting blood, []  blood in stool     GENITOURINARY:  []  burning with urination, []  blood in urine  PSYCHIATRIC:  []  history of major depression  INTEGUMENTARY:  []  rashes, []  ulcers  CONSTITUTIONAL:  []  fever, []  chills  For VQI Use Only  PRE-ADM LIVING: Home  AMB STATUS: Ambulatory  CAD Sx: History of MI, but no symptoms No MI within 6 months  PRIOR CHF:  Moderate  STRESS TEST: [x ] No, [ ]  Normal, [ ]  + ischemia, [ ]  + MI, [ ]  Both    Physical Examination  Filed Vitals:   01/01/15 1320  BP: 96/62  Pulse: 70  Height: 6' (1.829 m)  Weight: 153 lb 12.8 oz (69.763 kg)  SpO2: 100%   Body mass index is 20.85 kg/(m^2).  General: A&O x 3, thin male,  appears older than stated age in NAD  Head: [place]/AT  Neck: Supple, no nuchal rigidity, no palpable LAD  Pulmonary: Sym exp, good air movt, CTAB, no rales, rhonchi, & wheezing  Cardiac: RRR, Nl S1, S2, no Murmurs, rubs or gallops  Vascular: Vessel Right Left  Radial Palpable Palpable  Carotid Palpable, without bruit Palpable, without bruit  Aorta Palpable N/A  Femoral Palpable Palpable  Popliteal Palpable Palpable  PT Not palpable Not palpable  DP Palpable Palpable   Gastrointestinal: soft, no distension, normoactive bowel sounds, mild tenderness to RUQ. Palpable AAA.   Musculoskeletal: Kyphotic. M/S 5/5 throughout.Extremities without ischemic changes.  Neurologic: CN 2-12 intact. Pain and light touch intact in extremities. Motor exam as listed above  Psychiatric: Judgment intact, Mood & affect appropriate for pt's clinical situation  Dermatologic: See M/S exam for extremity exam, no rashes otherwise noted  Medical Decision Making  The patient is a 64 y.o. male who presents with AAA measuring 5.1 cm on ultrasound. The patient continues to have abdominal pain that is ongoing from two months ago. His prior nausea, diarrhea and postprandial pain have resolved. It is unlikely that his abdominal pain is from his aneurysm. However, the patient expresses concern over this aneurysm, so we will proceed with CTA of the abdomen and pelvis to further evaluate his anatomy for stent graft appropriateness and to identify other possible etiologies of his abdominal pain. Discussed that in a normal risk patient, 5.5 cm is the threshold for surgical repair. Did discuss that we may elect to proceed  with surgery if the AAA is less than 5.5 cm given his concern over his aneurysm. Emphasized continued control of hypertension and cessation of smoking. We will await the results of his CTA and will have him follow up in the office for further discussion. He knows to seek emergency assistance if he develops and worsening abdominal or back pain.   Maris Berger, PA-C [facility] Office: [phone] Pager: [phone]  01/01/2015, 2:49 PM   This patient was seen in conjunction with Dr. Edilia Bo  Agree with above. By ultrasound he has a 5.1 cm infrarenal abdominal aortic aneurysm. Given his abdominal pain, I have recommended a CT scan although I do not think this is related to his aneurysm. However he seems quite concerned about the aneurysm so I think the more information we get at this point the better. I'll see him back after his CT scan. In the office today, we did discuss open versus endovascular repair and the advantages and disadvantages associated with each.  Waverly Ferrari, MD, FACS Beeper [phone] Office: [phone]

## 2015-01-05 ENCOUNTER — Ambulatory Visit
Admission: RE | Admit: 2015-01-05 | Discharge: 2015-01-05 | Disposition: A | Discharge status: Home / Self Care | Payer: Medicare Other | Source: Ambulatory Visit | Attending: Vascular Surgery | Admitting: Vascular Surgery

## 2015-01-05 ENCOUNTER — Encounter: Payer: Self-pay | Admitting: Vascular Surgery

## 2015-01-05 DIAGNOSIS — I714 Abdominal aortic aneurysm, without rupture, unspecified: Secondary | ICD-10-CM

## 2015-01-05 MED ORDER — IOPAMIDOL (ISOVUE-370) INJECTION 76%
75.0000 mL | Freq: Once | INTRAVENOUS | Status: AC | PRN
  Administered 2015-01-05: 75 mL via INTRAVENOUS

## 2015-01-06 ENCOUNTER — Encounter: Payer: Self-pay | Admitting: Vascular Surgery

## 2015-01-06 ENCOUNTER — Ambulatory Visit (INDEPENDENT_AMBULATORY_CARE_PROVIDER_SITE_OTHER): Payer: Medicare Other | Admitting: Vascular Surgery

## 2015-01-06 VITALS — BP 96/58 | HR 71 | Ht 72.0 in | Wt 152.1 lb

## 2015-01-06 DIAGNOSIS — I714 Abdominal aortic aneurysm, without rupture, unspecified: Secondary | ICD-10-CM

## 2015-01-06 DIAGNOSIS — I2510 Atherosclerotic heart disease of native coronary artery without angina pectoris: Secondary | ICD-10-CM

## 2015-01-06 NOTE — Progress Notes (Signed)
[facility]  Patient name: Austin LewandowskyRobert C Tirone Rivera MRN: [id number] DOB: [birth year] Sex: male  REASON FOR VISIT: follow up of abdominal aortic aneurysm.  HPI: [name] is a 64 y.o. male who I last saw on 01/01/2015. By ultrasound he had a 5.1 cm infrarenal abdominal aortic aneurysm. He was having some abdominal pain which dye did not think was related to his aneurysm. However given the abdominal pain I felt that a CT scan was indicated. He comes in today to discuss those results.  Since I saw him on the 11th, he states that his abdominal pain has improved. He denies any significant back pain.  Past Medical History  Diagnosis Date  . Seizure disorder   . Nausea   . History of heartburn   . Chronic back pain   . IHD (ischemic heart disease)   . Cigarette smoker   . Hypercholesterolemia   . CHF (congestive heart failure)     EF had been 20% initially. 2010 EF 35-40%.  . Traumatic brain injury   . Seizures   . COPD (chronic obstructive pulmonary disease)    Family History  Problem Relation Age of Onset  . Heart disease Brother   . Hypertension Brother   . Heart attack Brother   . Heart disease Sister   . Coronary artery disease Brother   . Hypertension Brother   . Heart attack Brother   . Coronary artery disease Sister   . Diabetes Brother   . Thyroid disease Brother   . Heart attack Brother   . Diabetes Father   . Heart disease Father   . Hypertension Father   . Heart attack Father   . Hypertension Mother   . Varicose Veins Mother    SOCIAL HISTORY: History  Substance Use Topics  . Smoking status: Current Every Day Smoker -- 0.50 packs/day for 50 years    Types: Cigarettes  . Smokeless tobacco: Never Used  . Alcohol Use: No   Allergies  Allergen Reactions  . Aspirin     To much aspirin make his bleed on the inside can take a small amount.   Current Outpatient Prescriptions  Medication Sig Dispense Refill  . amoxicillin (AMOXIL)  500 MG capsule Pt has 1 more pill left 04/10/2014    . aspirin 325 MG tablet Take 325 mg by mouth daily.      . butalbital-acetaminophen-caffeine (FIORICET) 50-325-40 MG per tablet 3 (three) times daily.     . carvedilol (COREG) 6.25 MG tablet Take 1 tablet (6.25 mg total) by mouth 2 (two) times daily. 60 tablet 11  . divalproex (DEPAKOTE) 500 MG EC tablet Take 500 mg by mouth 3 (three) times daily. Prescribed by  [place] neurolog[place] in [place]    . docusate sodium (COLACE) 100 MG capsule Take 1 capsule (100 mg total) by mouth 2 (two) times daily as needed for mild constipation. 30 capsule 0  . furosemide (LASIX) 40 MG tablet TAKE ONE TABLET BY MOUTH EVERY OTHER DAY 30 tablet 5  . lisinopril (PRINIVIL,ZESTRIL) 5 MG tablet Take 1 tablet (5 mg total) by mouth daily. 90 tablet 3  . ondansetron (ZOFRAN) 4 MG tablet Take 1 tablet (4 mg total) by mouth every 8 (eight) hours as needed for nausea or vomiting. 20 tablet 0  . oxyCODONE-acetaminophen (PERCOCET/ROXICET) 5-325 MG per tablet Take 1 tablet by mouth every 6 (six) hours as needed for severe pain. 15 tablet 0  . pravastatin (PRAVACHOL)  40 MG tablet Take 1 tablet (40 mg total) by mouth daily. 90 tablet 3  . ranitidine (ZANTAC) 150 MG capsule Take 150 mg by mouth 2 (two) times daily.     No current facility-administered medications for this visit.   REVIEW OF SYSTEMS: Arly.Keller ] denotes positive finding; [  ] denotes negative finding  CARDIOVASCULAR:   chest pain    chest pressure   dyspnea on exertion    PULMONARY:    productive cough    asthma    wheezing CONSTITUTIONAL:   fever    chills  PHYSICAL EXAM: Filed Vitals:   01/06/15 1535  BP: 96/58  Pulse: 71  Height: 6' (1.829 m)  Weight: 152 lb 1.6 oz (68.992 kg)  SpO2: 100%   Body mass index is 20.62 kg/(m^2). GENERAL: The patient is a well-nourished male, in no acute distress. The vital signs are documented above. CARDIOVASCULAR: There is a regular rate and rhythm. He  has palpable femoral pulses. PULMONARY: There is good air exchange bilaterally without wheezing or rales. ABDOMEN: Soft and non-tender with normal pitched bowel sounds. His aneurysm is palpable and nontender.  DATA:  CT ABDOMEN PELVIS: I have reviewed the images from his CT scan which was performed on 01/05/2015. The maximum diameter of the infrarenal aorta is 4.8 cm. It was also noted that he had some mild luminal narrowing at the origin of the celiac trunk. The IMA was not clearly seen. The superior mesenteric artery was patent. 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  MEDICAL ISSUES: INFRARENAL ABDOMINAL AORTIC ANEURYSM: By CT scan the aneurysm measures 4.8 cm in maximum diameter. I have explained that we would normally not consider elective repair and was the aneurysm reached 5.5 cm in maximum diameter. Based on his CT scan I do not think he would be a good candidate for endovascular aneurysm repair. 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 Regardless, the aneurysm is not large enough to consider elective repair at this point. I have recommended a follow up ultrasound in 6 months and I will see him back at that time. He has to call sooner if he has problems.  Return in about 6 months (around 07/09/2015).   DICKSON,CHRISTOPHER S [facility]per: [phone]

## 2015-01-07 ENCOUNTER — Other Ambulatory Visit: Payer: Self-pay | Admitting: *Deleted

## 2015-01-07 DIAGNOSIS — I714 Abdominal aortic aneurysm, without rupture, unspecified: Secondary | ICD-10-CM

## 2015-02-17 ENCOUNTER — Ambulatory Visit (INDEPENDENT_AMBULATORY_CARE_PROVIDER_SITE_OTHER): Payer: Medicare Other | Admitting: Family Medicine

## 2015-02-17 ENCOUNTER — Encounter: Payer: Self-pay | Admitting: Family Medicine

## 2015-02-17 VITALS — BP 100/67 | HR 74 | Temp 97.9°F | Wt 149.0 lb

## 2015-02-17 DIAGNOSIS — I2510 Atherosclerotic heart disease of native coronary artery without angina pectoris: Secondary | ICD-10-CM | POA: Diagnosis not present

## 2015-02-17 DIAGNOSIS — J0101 Acute recurrent maxillary sinusitis: Secondary | ICD-10-CM

## 2015-02-17 MED ORDER — AMOXICILLIN 500 MG PO CAPS: 500.0000 mg | ORAL_CAPSULE | Freq: Two times a day (BID) | ORAL | Status: DC

## 2015-02-17 NOTE — Patient Instructions (Signed)
Sinusitis Sinusitis is redness, soreness, and inflammation of the paranasal sinuses. Paranasal sinuses are air pockets within the bones of your face (beneath the eyes, the middle of the forehead, or above the eyes). In healthy paranasal sinuses, mucus is able to drain out, and air is able to circulate through them by way of your nose. However, when your paranasal sinuses are inflamed, mucus and air can become trapped. This can allow bacteria and other germs to grow and cause infection. Sinusitis can develop quickly and last only a short time (acute) or continue over a long period (chronic). Sinusitis that lasts for more than 12 weeks is considered chronic.  CAUSES  Causes of sinusitis include:  Allergies.  Structural abnormalities, such as displacement of the cartilage that separates your nostrils (deviated septum), which can decrease the air flow through your nose and sinuses and affect sinus drainage.  Functional abnormalities, such as when the small hairs (cilia) that line your sinuses and help remove mucus do not work properly or are not present. SIGNS AND SYMPTOMS  Symptoms of acute and chronic sinusitis are the same. The primary symptoms are pain and pressure around the affected sinuses. Other symptoms include:  Upper toothache.  Earache.  Headache.  Bad breath.  Decreased sense of smell and taste.  A cough, which worsens when you are lying flat.  Fatigue.  Fever.  Thick drainage from your nose, which often is green and may contain pus (purulent).  Swelling and warmth over the affected sinuses. DIAGNOSIS  Your health care provider will perform a physical exam. During the exam, your health care provider may:  Look in your nose for signs of abnormal growths in your nostrils (nasal polyps).  Tap over the affected sinus to check for signs of infection.  View the inside of your sinuses (endoscopy) using an imaging device that has a light attached (endoscope). If your health  care provider suspects that you have chronic sinusitis, one or more of the following tests may be recommended:  Allergy tests.  Nasal culture. A sample of mucus is taken from your nose, sent to a lab, and screened for bacteria.  Nasal cytology. A sample of mucus is taken from your nose and examined by your health care provider to determine if your sinusitis is related to an allergy. TREATMENT  Most cases of acute sinusitis are related to a viral infection and will resolve on their own within 10 days. Sometimes medicines are prescribed to help relieve symptoms (pain medicine, decongestants, nasal steroid sprays, or saline sprays).  However, for sinusitis related to a bacterial infection, your health care provider will prescribe antibiotic medicines. These are medicines that will help kill the bacteria causing the infection.  Rarely, sinusitis is caused by a fungal infection. In theses cases, your health care provider will prescribe antifungal medicine. For some cases of chronic sinusitis, surgery is needed. Generally, these are cases in which sinusitis recurs more than 3 times per year, despite other treatments. HOME CARE INSTRUCTIONS   Drink plenty of water. Water helps thin the mucus so your sinuses can drain more easily.  Use a humidifier.  Inhale steam 3 to 4 times a day (for example, sit in the bathroom with the shower running).  Apply a warm, moist washcloth to your face 3 to 4 times a day, or as directed by your health care provider.  Use saline nasal sprays to help moisten and clean your sinuses.  Take medicines only as directed by your health care provider.    If you were prescribed either an antibiotic or antifungal medicine, finish it all even if you start to feel better. SEEK IMMEDIATE MEDICAL CARE IF:  You have increasing pain or severe headaches.  You have nausea, vomiting, or drowsiness.  You have swelling around your face.  You have vision problems.  You have a stiff  neck.  You have difficulty breathing. MAKE SURE YOU:   Understand these instructions.  Will watch your condition.  Will get help right away if you are not doing well or get worse. Document Released: 10/09/2005 Document Revised: 02/23/2014 Document Reviewed: 10/24/2011 [facility] Patient Information 2015 [place], [place]. This information is not intended to replace advice given to you by your health care provider. Make sure you discuss any questions you have with your health care provider.  Nicotine Addiction Nicotine can act as both a stimulant (excites/activates) and a sedative (calms/quiets). Immediately after exposure to nicotine, there is a "kick" caused in part by the drug's stimulation of the adrenal glands and resulting discharge of adrenaline (epinephrine). The rush of adrenaline stimulates the body and causes a sudden release of sugar. This means that smokers are always slightly hyperglycemic. Hyperglycemic means that the blood sugar is high, just like in diabetics. Nicotine also decreases the amount of insulin which helps control sugar levels in the body. There is an increase in blood pressure, breathing, and the rate of heart beats.  In addition, nicotine indirectly causes a release of dopamine in the brain that controls pleasure and motivation. A similar reaction is seen with other drugs of abuse, such as cocaine and heroin. This dopamine release is thought to cause the pleasurable sensations when smoking. In some different cases, nicotine can also create a calming effect, depending on sensitivity of the smoker's nervous system and the dose of nicotine taken. WHAT HAPPENS WHEN NICOTINE IS TAKEN FOR LONG PERIODS OF TIME?  Long-term use of nicotine results in addiction. It is difficult to stop.  Repeated use of nicotine creates tolerance. Higher doses of nicotine are needed to get the "kick." When nicotine use is stopped, withdrawal may last a month or more. Withdrawal may begin within a  few hours after the last cigarette. Symptoms peak within the first few days and may lessen within a few weeks. For some people, however, symptoms may last for months or longer. Withdrawal symptoms include:   Irritability.  Craving.  Learning and attention deficits.  Sleep disturbances.  Increased appetite. Craving for tobacco may last for 6 months or longer. Many behaviors done while using nicotine can also play a part in the severity of withdrawal symptoms. For some people, the feel, smell, and sight of a cigarette and the ritual of obtaining, handling, lighting, and smoking the cigarette are closely linked with the pleasure of smoking. When stopped, they also miss the related behaviors which make the withdrawal or craving worse. While nicotine gum and patches may lessen the drug aspects of withdrawal, cravings often persist. WHAT ARE THE MEDICAL CONSEQUENCES OF NICOTINE USE?  Nicotine addiction accounts for one-third of all cancers. The top cancer caused by tobacco is lung cancer. Lung cancer is the number one cancer killer of both men and women.  Smoking is also associated with cancers of the:  Mouth.  Pharynx.  Larynx.  Esophagus.  Stomach.  Pancreas.  Cervix.  Kidney.  Ureter.  Bladder.  Smoking also causes lung diseases such as lasting (chronic) bronchitis and emphysema.  It worsens asthma in adults and children.  Smoking increases the risk  of heart disease, including:  Stroke.  Heart attack.  Vascular disease.  Aneurysm.  Passive or secondary smoke can also increase medical risks including:  Asthma in children.  Sudden Infant Death Syndrome (SIDS).  Additionally, dropped cigarettes are the leading cause of residential fire fatalities.  Nicotine poisoning has been reported from accidental ingestion of tobacco products by children and pets. Death usually results in a few minutes from respiratory failure (when a person stops breathing) caused by  paralysis. TREATMENT   Medication. Nicotine replacement medicines such as nicotine gum and the patch are used to stop smoking. These medicines gradually lower the dosage of nicotine in the body. These medicines do not contain the carbon monoxide and other toxins found in tobacco smoke.  Hypnotherapy.  Relaxation therapy.  Nicotine Anonymous (a 12-step support program). Find times and locations in your local yellow pages. Document Released: 06/14/2004 Document Revised: 01/01/2012 Document Reviewed: 12/05/2013 [facility] Patient Information 2015 [place], [place]. This information is not intended to replace advice given to you by your health care provider. Make sure you discuss any questions you have with your health care provider.

## 2015-02-18 DIAGNOSIS — J329 Chronic sinusitis, unspecified: Secondary | ICD-10-CM | POA: Insufficient documentation

## 2015-02-18 NOTE — Assessment & Plan Note (Signed)
-   Noted over left maxillary sinus. Also mild tenderness over left frontal sinus. - Amoxicillin 500mg  BID x 7 days - Follow up if no improvement

## 2015-02-18 NOTE — Progress Notes (Signed)
Subjective:     Patient ID: [name], male   DOB: 07-10-1951, 64 y.o.   MRN: [id number]  HPI [name] is a 64yo male presenting today for sinus infection: - Left facial pain, congestion, productive cough x1 week - Temperature of 100F and chills at night - Has had similar episodes of sinusitis in past, all treated effectively with Amoxicillin - Denies shortness of breath, chest pain, palpitations, rash, diarrhea, constipation, nausea, vomiting  Review of Systems  Constitutional: Positive for fever and chills.  HENT: Positive for congestion and sinus pressure.   Respiratory: Positive for cough. Negative for shortness of breath.   Cardiovascular: Negative for chest pain and palpitations.  Gastrointestinal: Negative for nausea, vomiting, diarrhea and constipation.  Skin: Negative for rash.       Objective:   Physical Exam  Constitutional: He appears well-developed and well-nourished. No distress.  HENT:  Sinuses clear to illumination bilaterally. Mild tenderness to palpation over frontal sinuses L>R. Significant tenderness over maxillary sinuses L>R.  Cardiovascular: Normal rate and regular rhythm.  Exam reveals no gallop and no friction rub.   No murmur heard. Pulmonary/Chest: Effort normal. No respiratory distress. He has no wheezes. He has no rales.  Abdominal: Soft. He exhibits no distension. There is no tenderness. There is no rebound.  Lymphadenopathy:    He has no cervical adenopathy.       Assessment:     Please refer to Problem List for Assessment.     Plan:     Please refer to Problem List for Plan.

## 2015-02-19 ENCOUNTER — Ambulatory Visit: Payer: Medicare Other | Admitting: Cardiology

## 2015-02-20 ENCOUNTER — Emergency Department (HOSPITAL_COMMUNITY): Payer: Medicare Other

## 2015-02-20 ENCOUNTER — Encounter (HOSPITAL_COMMUNITY): Payer: Self-pay | Admitting: Emergency Medicine

## 2015-02-20 ENCOUNTER — Inpatient Hospital Stay (HOSPITAL_COMMUNITY): Payer: Medicare Other

## 2015-02-20 ENCOUNTER — Inpatient Hospital Stay (HOSPITAL_COMMUNITY)
Admission: EM | Admit: 2015-02-20 | Discharge: 2015-02-26 | DRG: 438 | Disposition: A | Discharge status: Home Health | Payer: Medicare Other | Attending: Family Medicine | Admitting: Family Medicine

## 2015-02-20 DIAGNOSIS — R339 Retention of urine, unspecified: Secondary | ICD-10-CM | POA: Diagnosis present

## 2015-02-20 DIAGNOSIS — E43 Unspecified severe protein-calorie malnutrition: Secondary | ICD-10-CM | POA: Diagnosis present

## 2015-02-20 DIAGNOSIS — I2510 Atherosclerotic heart disease of native coronary artery without angina pectoris: Secondary | ICD-10-CM | POA: Diagnosis present

## 2015-02-20 DIAGNOSIS — E876 Hypokalemia: Secondary | ICD-10-CM | POA: Diagnosis present

## 2015-02-20 DIAGNOSIS — N179 Acute kidney failure, unspecified: Secondary | ICD-10-CM | POA: Diagnosis present

## 2015-02-20 DIAGNOSIS — Z681 Body mass index (BMI) 19 or less, adult: Secondary | ICD-10-CM | POA: Diagnosis not present

## 2015-02-20 DIAGNOSIS — Z886 Allergy status to analgesic agent status: Secondary | ICD-10-CM | POA: Diagnosis not present

## 2015-02-20 DIAGNOSIS — I5022 Chronic systolic (congestive) heart failure: Secondary | ICD-10-CM | POA: Diagnosis not present

## 2015-02-20 DIAGNOSIS — R109 Unspecified abdominal pain: Secondary | ICD-10-CM

## 2015-02-20 DIAGNOSIS — J329 Chronic sinusitis, unspecified: Secondary | ICD-10-CM | POA: Diagnosis present

## 2015-02-20 DIAGNOSIS — F4310 Post-traumatic stress disorder, unspecified: Secondary | ICD-10-CM | POA: Diagnosis not present

## 2015-02-20 DIAGNOSIS — J449 Chronic obstructive pulmonary disease, unspecified: Secondary | ICD-10-CM | POA: Diagnosis present

## 2015-02-20 DIAGNOSIS — N183 Chronic kidney disease, stage 3 (moderate): Secondary | ICD-10-CM | POA: Diagnosis present

## 2015-02-20 DIAGNOSIS — R1013 Epigastric pain: Secondary | ICD-10-CM | POA: Diagnosis not present

## 2015-02-20 DIAGNOSIS — S069XAA Unspecified intracranial injury with loss of consciousness status unknown, initial encounter: Secondary | ICD-10-CM | POA: Insufficient documentation

## 2015-02-20 DIAGNOSIS — K219 Gastro-esophageal reflux disease without esophagitis: Secondary | ICD-10-CM | POA: Diagnosis present

## 2015-02-20 DIAGNOSIS — I248 Other forms of acute ischemic heart disease: Secondary | ICD-10-CM | POA: Diagnosis present

## 2015-02-20 DIAGNOSIS — E871 Hypo-osmolality and hyponatremia: Secondary | ICD-10-CM | POA: Diagnosis present

## 2015-02-20 DIAGNOSIS — R0602 Shortness of breath: Secondary | ICD-10-CM

## 2015-02-20 DIAGNOSIS — F17210 Nicotine dependence, cigarettes, uncomplicated: Secondary | ICD-10-CM | POA: Diagnosis present

## 2015-02-20 DIAGNOSIS — Z951 Presence of aortocoronary bypass graft: Secondary | ICD-10-CM | POA: Diagnosis not present

## 2015-02-20 DIAGNOSIS — K861 Other chronic pancreatitis: Secondary | ICD-10-CM | POA: Diagnosis present

## 2015-02-20 DIAGNOSIS — R4182 Altered mental status, unspecified: Secondary | ICD-10-CM | POA: Diagnosis not present

## 2015-02-20 DIAGNOSIS — J189 Pneumonia, unspecified organism: Secondary | ICD-10-CM | POA: Diagnosis not present

## 2015-02-20 DIAGNOSIS — E780 Pure hypercholesterolemia: Secondary | ICD-10-CM | POA: Diagnosis present

## 2015-02-20 DIAGNOSIS — K529 Noninfective gastroenteritis and colitis, unspecified: Secondary | ICD-10-CM | POA: Diagnosis present

## 2015-02-20 DIAGNOSIS — K859 Acute pancreatitis without necrosis or infection, unspecified: Secondary | ICD-10-CM | POA: Diagnosis present

## 2015-02-20 DIAGNOSIS — I509 Heart failure, unspecified: Secondary | ICD-10-CM | POA: Diagnosis not present

## 2015-02-20 DIAGNOSIS — I714 Abdominal aortic aneurysm, without rupture: Secondary | ICD-10-CM | POA: Diagnosis present

## 2015-02-20 DIAGNOSIS — N3000 Acute cystitis without hematuria: Secondary | ICD-10-CM | POA: Diagnosis not present

## 2015-02-20 DIAGNOSIS — I5023 Acute on chronic systolic (congestive) heart failure: Secondary | ICD-10-CM | POA: Diagnosis present

## 2015-02-20 DIAGNOSIS — Z87820 Personal history of traumatic brain injury: Secondary | ICD-10-CM

## 2015-02-20 DIAGNOSIS — S069X0S Unspecified intracranial injury without loss of consciousness, sequela: Secondary | ICD-10-CM

## 2015-02-20 DIAGNOSIS — I5043 Acute on chronic combined systolic (congestive) and diastolic (congestive) heart failure: Secondary | ICD-10-CM | POA: Insufficient documentation

## 2015-02-20 DIAGNOSIS — Z7982 Long term (current) use of aspirin: Secondary | ICD-10-CM | POA: Diagnosis not present

## 2015-02-20 DIAGNOSIS — S069X9A Unspecified intracranial injury with loss of consciousness of unspecified duration, initial encounter: Secondary | ICD-10-CM | POA: Insufficient documentation

## 2015-02-20 DIAGNOSIS — D72829 Elevated white blood cell count, unspecified: Secondary | ICD-10-CM | POA: Insufficient documentation

## 2015-02-20 DIAGNOSIS — G40909 Epilepsy, unspecified, not intractable, without status epilepticus: Secondary | ICD-10-CM | POA: Diagnosis present

## 2015-02-20 LAB — CBC WITH DIFFERENTIAL/PLATELET
BASOS PCT: 0 % (ref 0–1)
Basophils Absolute: 0 10*3/uL (ref 0.0–0.1)
Eosinophils Absolute: 0 10*3/uL (ref 0.0–0.7)
Eosinophils Relative: 0 % (ref 0–5)
HCT: 34.5 % — ABNORMAL LOW (ref 39.0–52.0)
HEMOGLOBIN: 12.1 g/dL — AB (ref 13.0–17.0)
LYMPHS PCT: 9 % — AB (ref 12–46)
Lymphs Abs: 1.9 10*3/uL (ref 0.7–4.0)
MCH: 34.4 pg — ABNORMAL HIGH (ref 26.0–34.0)
MCHC: 35.1 g/dL (ref 30.0–36.0)
MCV: 98 fL (ref 78.0–100.0)
MONO ABS: 3.4 10*3/uL — AB (ref 0.1–1.0)
MONOS PCT: 16 % — AB (ref 3–12)
NEUTROS ABS: 15.8 10*3/uL — AB (ref 1.7–7.7)
NEUTROS PCT: 75 % (ref 43–77)
Platelets: 182 10*3/uL (ref 150–400)
RBC: 3.52 MIL/uL — ABNORMAL LOW (ref 4.22–5.81)
RDW: 12.4 % (ref 11.5–15.5)
WBC: 21.1 10*3/uL — ABNORMAL HIGH (ref 4.0–10.5)

## 2015-02-20 LAB — URINALYSIS, ROUTINE W REFLEX MICROSCOPIC
Bilirubin Urine: NEGATIVE
GLUCOSE, UA: NEGATIVE mg/dL
Hgb urine dipstick: NEGATIVE
Ketones, ur: NEGATIVE mg/dL
Leukocytes, UA: NEGATIVE
Nitrite: NEGATIVE
Protein, ur: NEGATIVE mg/dL
Specific Gravity, Urine: 1.024 (ref 1.005–1.030)
UROBILINOGEN UA: 1 mg/dL (ref 0.0–1.0)
pH: 5 (ref 5.0–8.0)

## 2015-02-20 LAB — I-STAT CHEM 8, ED
BUN: 56 mg/dL — ABNORMAL HIGH (ref 6–23)
CREATININE: 2.1 mg/dL — AB (ref 0.50–1.35)
Calcium, Ion: 1.07 mmol/L — ABNORMAL LOW (ref 1.13–1.30)
Chloride: 72 mmol/L — ABNORMAL LOW (ref 96–112)
Glucose, Bld: 103 mg/dL — ABNORMAL HIGH (ref 70–99)
HCT: 38 % — ABNORMAL LOW (ref 39.0–52.0)
Hemoglobin: 12.9 g/dL — ABNORMAL LOW (ref 13.0–17.0)
POTASSIUM: 6.1 mmol/L — AB (ref 3.5–5.1)
Sodium: 130 mmol/L — ABNORMAL LOW (ref 135–145)
TCO2: 23 mmol/L (ref 0–100)

## 2015-02-20 LAB — COMPREHENSIVE METABOLIC PANEL
ALT: 7 U/L (ref 0–53)
AST: 15 U/L (ref 0–37)
Albumin: 2.9 g/dL — ABNORMAL LOW (ref 3.5–5.2)
Alkaline Phosphatase: 50 U/L (ref 39–117)
Anion gap: 13 (ref 5–15)
BUN: 35 mg/dL — ABNORMAL HIGH (ref 6–23)
CHLORIDE: 93 mmol/L — AB (ref 96–112)
CO2: 24 mmol/L (ref 19–32)
Calcium: 8.9 mg/dL (ref 8.4–10.5)
Creatinine, Ser: 2.27 mg/dL — ABNORMAL HIGH (ref 0.50–1.35)
GFR, EST AFRICAN AMERICAN: 33 mL/min — AB (ref >90)
GFR, EST NON AFRICAN AMERICAN: 29 mL/min — AB (ref >90)
Glucose, Bld: 106 mg/dL — ABNORMAL HIGH (ref 70–99)
Potassium: 4.2 mmol/L (ref 3.5–5.1)
Sodium: 130 mmol/L — ABNORMAL LOW (ref 135–145)
Total Bilirubin: 0.7 mg/dL (ref 0.3–1.2)
Total Protein: 6.8 g/dL (ref 6.0–8.3)

## 2015-02-20 LAB — MRSA PCR SCREENING: MRSA BY PCR: NEGATIVE

## 2015-02-20 LAB — I-STAT TROPONIN, ED: Troponin i, poc: 0 ng/mL (ref 0.00–0.08)

## 2015-02-20 LAB — I-STAT CG4 LACTIC ACID, ED
LACTIC ACID, VENOUS: 1.44 mmol/L (ref 0.5–2.0)
Lactic Acid, Venous: 2.64 mmol/L (ref 0.5–2.0)

## 2015-02-20 LAB — LIPASE, BLOOD: Lipase: 157 U/L — ABNORMAL HIGH (ref 11–59)

## 2015-02-20 MED ORDER — DIVALPROEX SODIUM 500 MG PO DR TAB
500.0000 mg | DELAYED_RELEASE_TABLET | Freq: Three times a day (TID) | ORAL | Status: DC
  Administered 2015-02-20 – 2015-02-26 (×18): 500 mg via ORAL
  Filled 2015-02-20 (×20): qty 1

## 2015-02-20 MED ORDER — FENTANYL CITRATE (PF) 100 MCG/2ML IJ SOLN
50.0000 ug | INTRAMUSCULAR | Status: DC | PRN
  Administered 2015-02-20: 50 ug via INTRAVENOUS
  Filled 2015-02-20: qty 2

## 2015-02-20 MED ORDER — HEPARIN SODIUM (PORCINE) 5000 UNIT/ML IJ SOLN
5000.0000 [IU] | Freq: Three times a day (TID) | INTRAMUSCULAR | Status: DC
  Administered 2015-02-20 – 2015-02-26 (×17): 5000 [IU] via SUBCUTANEOUS
  Filled 2015-02-20 (×19): qty 1

## 2015-02-20 MED ORDER — SODIUM CHLORIDE 0.9 % IJ SOLN
3.0000 mL | Freq: Two times a day (BID) | INTRAMUSCULAR | Status: DC
  Administered 2015-02-20 – 2015-02-26 (×9): 3 mL via INTRAVENOUS

## 2015-02-20 MED ORDER — ACETAMINOPHEN 650 MG RE SUPP: 650.0000 mg | Freq: Four times a day (QID) | RECTAL | Status: DC | PRN

## 2015-02-20 MED ORDER — PIPERACILLIN-TAZOBACTAM 3.375 G IVPB
3.3750 g | Freq: Three times a day (TID) | INTRAVENOUS | Status: DC
  Administered 2015-02-20 – 2015-02-22 (×6): 3.375 g via INTRAVENOUS
  Filled 2015-02-20 (×8): qty 50

## 2015-02-20 MED ORDER — SODIUM CHLORIDE 0.9 % IV BOLUS (SEPSIS)
500.0000 mL | INTRAVENOUS | Status: AC
  Administered 2015-02-20: 500 mL via INTRAVENOUS

## 2015-02-20 MED ORDER — SODIUM CHLORIDE 0.9 % IV BOLUS (SEPSIS)
1000.0000 mL | INTRAVENOUS | Status: AC
  Administered 2015-02-20 (×2): 1000 mL via INTRAVENOUS

## 2015-02-20 MED ORDER — ASPIRIN 325 MG PO TABS
325.0000 mg | ORAL_TABLET | Freq: Every day | ORAL | Status: DC
Start: 2015-02-21 — End: 2015-02-26
  Administered 2015-02-21 – 2015-02-26 (×6): 325 mg via ORAL
  Filled 2015-02-20 (×6): qty 1

## 2015-02-20 MED ORDER — VANCOMYCIN HCL IN DEXTROSE 1-5 GM/200ML-% IV SOLN
1000.0000 mg | INTRAVENOUS | Status: DC
  Administered 2015-02-20: 1000 mg via INTRAVENOUS
  Filled 2015-02-20: qty 200

## 2015-02-20 MED ORDER — ACETAMINOPHEN 325 MG PO TABS
650.0000 mg | ORAL_TABLET | Freq: Four times a day (QID) | ORAL | Status: DC | PRN
  Administered 2015-02-23: 650 mg via ORAL
  Filled 2015-02-20: qty 2

## 2015-02-20 MED ORDER — DEXTROSE-NACL 5-0.9 % IV SOLN
INTRAVENOUS | Status: DC
  Administered 2015-02-21: 100 mL/h via INTRAVENOUS
  Administered 2015-02-21 – 2015-02-22 (×2): via INTRAVENOUS

## 2015-02-20 MED ORDER — FENTANYL CITRATE (PF) 100 MCG/2ML IJ SOLN
100.0000 ug | Freq: Once | INTRAMUSCULAR | Status: AC
  Administered 2015-02-20: 100 ug via INTRAVENOUS
  Filled 2015-02-20: qty 2

## 2015-02-20 NOTE — ED Notes (Signed)
Family at bedside. 

## 2015-02-20 NOTE — ED Notes (Signed)
Admitting at bedside 

## 2015-02-20 NOTE — ED Notes (Signed)
Dr. Loretha StaplerWofford was notified of the patients 2.64 I-Stat CG4 Lactic Acid.

## 2015-02-20 NOTE — ED Notes (Signed)
Pt is aware he needs a urine sample 

## 2015-02-20 NOTE — H&P (Signed)
Family Medicine [facility] Admission History and Physical Service Pager: [phone]  Patient name: [name] Medical record number: [id number] Date of birth: 25-Jan-1951 Age: 64 y.o. Gender: male  Primary Care Provider: Everlene Other, DO Consultants: Surgery Code Status: Full  Chief Complaint: Abdominal pain  Assessment and Plan: [name] is a 64 y.o. male presenting with Abdominal pain found to have acute on chronic pancreatitis. PMH is significant for HFrEF, CAD s/p CABG, COPD, TBI, seizures, PTSD.  Acute on Chronic Pancreatitis. qSOFA 2 on admission (SBP and mental status). Meets 1 SIRS criteria. BISAP score of 3. Patient is hypotensive at baseline per wife and usually runs in the SBPs of 90s-100s. CT with fluid collection, concerning for pseudocyst vs abscess. Unclear cause of chronic pancreatitis - patient has never been diagnosed with this prior. No history of heavy alcohol use. Normal LFTS and alkaline phosphatase make gallstone etiology less likely. Possibly related to remote intra-abdominal injury, though no recent traumas to explain acute flare. Also possibly ischemic in nature given history of severe atherosclerosis.  - Admitted to step-down under attending Dr Leveda Anna. - Cardiac monitoring - NPO - Pain control with IV fentanyl - Gentle IVF - Surgery consulted in ED, appreciate assistance - Low threshold for consulting CCM - Fasting lipid panel in the morning - s/p 1 dose of vanc and zosyn in ED - Will continue zosyn for now to treat potential abscess - Urine and blood cultures pending - Lethargy likely due to oversedation with narcotics (no pain elicited on abdominal exam), though will be careful to monitor for improvement.   HFrEF. Echo in 2013 with EF of 30 to 35%. Has refused ICD placement in the past. Currently euvolemic - Will repeat echo during this hospitalization - Will be cautious to avoid fluid overload.  - Hold home coreg, lasix, and  lisinopril while critically ill, restart as blood pressure allows  CAD / AAA. Patient with 6-vessel CABG in 2006. AAA diagnosed this year and is currently being managed by vascular surgery as outpatient. - Continue home aspirin - Restart home statin when taking PO  AKI on CKD. Cr 2.27 on admission. Baseline 1.2-1.5. Likely pre-renal in setting of poor PO intake and chronic diarrhea - Gentle IVF - F/u AM BMP  COPD. Not on any medications - Continue to monitor  Chronic Diarrhea. Unclear etiology. May be related to ischemic mesentery disease or ischemic colitis. Unlikely to be c diff, though possible given recent antibiotic. - Consider c diff PCR and FOBT here - Consider GI referral as outpatient if persists.   TBI / Seizure disorder / PTSD - Continue home depakote - Consider checking valproate level if continues to be lethargic  Sinusitis: recent URI symptoms, started on amoxicillin for sinus infection on 4/28 - hold amox while NPO - consider restarting when tolerating PO or on discharge - monitor URI symptoms  FEN/GI: NPO, D5 NS @ 100cc/hr Prophylaxis: SubQ heparin  Disposition: Admitted to step-down pending clinical improvement  History of Present Illness: [name] is a 64 y.o. male presenting with abdominal pain and altered mental status. History is provided by the patient's wife.  She states that the patient has had intermittent abdominal pain for the past 3-4 months. States that the pain occurs randomly, is not postprandial, and has been increasing in intensity, especially over the past 3 days. Wife states that the patient has a history of TBI and PTSD and "no tolerance to pain," so she was not  sure if the patient was actually in severe pain. Today, the patient begged to come to the hospital due to his abdominal pain, so his wife decided to bring him in to the ED.  Wife reports that the patient has been more confused and sleepy today. States that the patient tends to  become confused with minimal pain. When this occurs he becomes disoriented and wants to sleep a lot.   Wife states that the patient occasionally drank about 30 years ago, but has never been a heavy drinker, and does not drink at all anymore. Endorses some nausea without vomiting. Also states that he has had chronic diarrhea for several months and has taken a lot of immodium. Patient has lost about 7 pounds in the past 3-4 months per her report.  Patient has also had mild URI symptoms over the past week and was diagnosed with sinusitis 2 days ago and started on amoxicillin.   No chest pain or shortness of breath. No increased LE swelling.  In the ED, patient had abdominal CT performed which revealed acute on chronic pancreatitis with possible abscess or pseudocyst. Other work up was significant for elevated lipase (157), and lactic acidosis (2.64). Patient was given 2.5L total of NS and started on vancomycin and zosyn.   Review Of Systems: Per HPI, otherwise 12 point review of systems was performed and was unremarkable.  Patient Active Problem List   Diagnosis Date Noted  . Acute pancreatitis 02/20/2015  . Sinusitis 02/18/2015  . Abdominal pain 12/03/2014  . AAA (abdominal aortic aneurysm) 11/11/2014  . S/P CABG (coronary artery bypass graft) 11/10/2014  . Orthostatic hypotension 04/10/2014  . CHRONIC SYSTOLIC HEART FAILURE 08/21/2007  . HYPERCHOLESTEROLEMIA 12/20/2006  . ERECTILE DYSFUNCTION 12/20/2006  . TOBACCO DEPENDENCE 12/20/2006  . CORONARY, ARTERIOSCLEROSIS 12/20/2006  . GASTROESOPHAGEAL REFLUX, NO ESOPHAGITIS 12/20/2006  . CONVULSIONS, SEIZURES, NOS 12/20/2006   Past Medical History: Past Medical History  Diagnosis Date  . Seizure disorder   . Nausea   . History of heartburn   . Chronic back pain   . IHD (ischemic heart disease)   . Cigarette smoker   . Hypercholesterolemia   . CHF (congestive heart failure)     EF had been 20% initially. 2010 EF 35-40%.  . Traumatic  brain injury   . Seizures   . COPD (chronic obstructive pulmonary disease)    Past Surgical History: Past Surgical History  Procedure Laterality Date  . Cardiac catheterization  03/12/2007  . Coronary artery bypass graft  2000    x6, graft to the PDA and LAD, patent saphenous vein graft to acute marginal and posterior lateral severely diseased saphenous vein graft to diagonal. Patent LIMA to the LAD.  Marland Kitchen Shoulder surgery      right  . Facial reconstruction surgery    . Back surgery     Social History: History  Substance Use Topics  . Smoking status: Current Every Day Smoker -- 0.50 packs/day for 50 years    Types: Cigarettes  . Smokeless tobacco: Never Used  . Alcohol Use: No   Please also refer to relevant sections of EMR.  Family History: Family History  Problem Relation Age of Onset  . Heart disease Brother   . Hypertension Brother   . Heart attack Brother   . Heart disease Sister   . Coronary artery disease Brother   . Hypertension Brother   . Heart attack Brother   . Coronary artery disease Sister   . Diabetes Brother   .  Thyroid disease Brother   . Heart attack Brother   . Diabetes Father   . Heart disease Father   . Hypertension Father   . Heart attack Father   . Hypertension Mother   . Varicose Veins Mother    Allergies and Medications: Allergies  Allergen Reactions  . Aspirin     To much aspirin make his bleed on the inside can take a small amount.  . Morphine And Related    No current facility-administered medications on file prior to encounter.   Current Outpatient Prescriptions on File Prior to Encounter  Medication Sig Dispense Refill  . amoxicillin (AMOXIL) 500 MG capsule Take 1 capsule (500 mg total) by mouth 2 (two) times daily. 14 capsule 0  . aspirin 325 MG tablet Take 325 mg by mouth daily.      . carvedilol (COREG) 6.25 MG tablet Take 1 tablet (6.25 mg total) by mouth 2 (two) times daily. 60 tablet 11  . divalproex (DEPAKOTE) 500 MG EC  tablet Take 500 mg by mouth 3 (three) times daily. Prescribed by  his neurologist in [place]    . docusate sodium (COLACE) 100 MG capsule Take 1 capsule (100 mg total) by mouth 2 (two) times daily as needed for mild constipation. 30 capsule 0  . furosemide (LASIX) 40 MG tablet TAKE ONE TABLET BY MOUTH EVERY OTHER DAY 30 tablet 5  . lisinopril (PRINIVIL,ZESTRIL) 5 MG tablet Take 1 tablet (5 mg total) by mouth daily. 90 tablet 3  . ondansetron (ZOFRAN) 4 MG tablet Take 1 tablet (4 mg total) by mouth every 8 (eight) hours as needed for nausea or vomiting. 20 tablet 0  . oxyCODONE-acetaminophen (PERCOCET/ROXICET) 5-325 MG per tablet Take 1 tablet by mouth every 6 (six) hours as needed for severe pain. 15 tablet 0  . pravastatin (PRAVACHOL) 40 MG tablet Take 1 tablet (40 mg total) by mouth daily. 90 tablet 3  . ranitidine (ZANTAC) 150 MG capsule Take 150 mg by mouth 2 (two) times daily.      Objective: BP 95/46 mmHg  Pulse 76  Temp(Src) 99.2 F (37.3 C) (Rectal)  Resp 14  Ht 6\' 3"  (1.905 m)  Wt 150 lb (68.04 kg)  BMI 18.75 kg/m2  SpO2 100% Exam: General: Chronically ill appearing man lying in hospital bed HEENT: EOMI, PERRL, MMM Cardiovascular: Distant heart sounds, RRR, no murmurs appreciated Respiratory: NWOB, CTAB Abdomen: Soft, nontender, nondistended Extremities: No cyanosis or edema Skin: No rashes Neuro: Awake, but lethargic. Follows commands. No focal deficits. Mild diffuse weakness/deconditioning.  Labs and Imaging: CBC BMET   Recent Labs Lab 02/20/15 1419 02/20/15 1529  WBC 21.1*  --   HGB 12.1* 12.9*  HCT 34.5* 38.0*  PLT 182  --     Recent Labs Lab 02/20/15 1419 02/20/15 1529  NA 130* 130*  K 4.2 6.1*  CL 93* 72*  CO2 24  --   BUN 35* 56*  CREATININE 2.27* 2.10*  GLUCOSE 106* 103*  CALCIUM 8.9  --      Lactic acid 2.64 > 1.44  Lipase 157  Ct Abdomen Pelvis Wo Contrast Liver and gallbladder are normal. Moderate inflammatory change seen around the  pancreas. Numerous pancreatic calcifications. 1.5-2 cm oval low-attenuation lesion in the body of the pancreas, new from prior study. Spleen and adrenal glands normal. Right kidney normal. Severe left renal atrophy stable. Stable abdominal aortic aneurysm in terms of size and appearance measuring up to 48 x 41 mm. No acute abnormalities involving  smaller large bowel or stomach. Bladder is normal. Trace free fluid in the pelvis.  Heavy coronary artery calcification. Visualized portions of the lung bases clear. No acute osseous abnormalities.  IMPRESSION: Moderate inflammatory change involving the pancreas consistent with pancreatitis, acute, superimposed on chronic. Low-attenuation lesion in the body of the pancreas may represent pancreatic abscess or pseudocyst.  Other nonacute findings described above.    CXR Patient rotated right. Prior median sternotomy. Normal heart size. No pleural effusion or pneumothorax. Clear lungs. No acute cardiopulmonary disease.  Ardith Dark, MD 02/20/2015, 5:55 PM PGY-1, [facility] FPTS Intern pager: [phone], text pages welcome  FPTS Upper-Level Resident Addendum  I have independently interviewed and examined the patient. I have discussed the above with the original author and agree with their documentation. My edits for correction/addition/clarification are in pink. Please see also any attending notes.   Abram Sander, MD PGY-2, [facility] FPTS Service pager: [phone] (text pages welcome through [facility])

## 2015-02-20 NOTE — ED Provider Notes (Signed)
CSN: [id number]     Arrival date & time 02/20/15  1354 History   First MD Initiated Contact with Patient 02/20/15 1438     Chief Complaint  Patient presents with  . Abdominal Pain     (Consider location/radiation/quality/duration/timing/severity/associated sxs/prior Treatment) HPI  [name] Is a 64 year old male with a past medical history of seizure disorder, traumatic brain injury, PTSD, per the wife, chronic back pain, coronary artery disease status post bypass graft 6 in 2000, tobacco abuse, COPD, and known abdominal aortic aneurysm at the distal aorta measuring 5.1 x 4.7 cm, with an estimated length of 9.3 cm. There is a level V caveat due to altered mental status. Patient is brought to the emergency department by his wife for severe abdominal pain. The history is given by his wife. She states that he has a past history of severe abdominal pain which is what led to the finding of his abdominal aortic aneurysm. He has been seen by vascular surgery and the wife states he was told that his pain is not likely from the aortic aneurysm. Wife states that because of his TBI and history of PTSD. He sometimes "manufactures symptoms and acts as though he is disoriented. She states that she is not sure if he is truly confused now, or if this is one of his "episodes." She states that on  4 days ago, patient developed symptoms of an upper respiratory infection with a cough and began running a fever of about 101. He is given amoxicillin by my practice. The patient began complaining of pain in his lower abdomen which has been doubling him over. He states it is cramping, severe. Today, the patient begs to come to the hospital. Wife states he has not been vomiting. The patient points to the right lower quadrant of his abdomen,when asked about where his pain is located. Past Medical History  Diagnosis Date  . Seizure disorder   . Nausea   . History of heartburn   . Chronic back pain   . IHD (ischemic  heart disease)   . Cigarette smoker   . Hypercholesterolemia   . CHF (congestive heart failure)     EF had been 20% initially. 2010 EF 35-40%.  . Traumatic brain injury   . Seizures   . COPD (chronic obstructive pulmonary disease)    Past Surgical History  Procedure Laterality Date  . Cardiac catheterization  03/12/2007  . Coronary artery bypass graft  2000    x6, graft to the PDA and LAD, patent saphenous vein graft to acute marginal and posterior lateral severely diseased saphenous vein graft to diagonal. Patent LIMA to the LAD.  Marland Kitchen Shoulder surgery      right  . Facial reconstruction surgery    . Back surgery     Family History  Problem Relation Age of Onset  . Heart disease Brother   . Hypertension Brother   . Heart attack Brother   . Heart disease Sister   . Coronary artery disease Brother   . Hypertension Brother   . Heart attack Brother   . Coronary artery disease Sister   . Diabetes Brother   . Thyroid disease Brother   . Heart attack Brother   . Diabetes Father   . Heart disease Father   . Hypertension Father   . Heart attack Father   . Hypertension Mother   . Varicose Veins Mother    History  Substance Use Topics  . Smoking status: Current Every  Day Smoker -- 0.50 packs/day for 50 years    Types: Cigarettes  . Smokeless tobacco: Never Used  . Alcohol Use: No    Review of Systems   Ten systems reviewed and are negative for acute change, except as noted in the HPI.     Allergies  Aspirin  Home Medications   Prior to Admission medications   Medication Sig Start Date End Date Taking? Authorizing Provider  amoxicillin (AMOXIL) 500 MG capsule Take 1 capsule (500 mg total) by mouth 2 (two) times daily. 02/17/15   Crowheart N Rumley, DO  aspirin 325 MG tablet Take 325 mg by mouth daily.      Historical Provider, MD  butalbital-acetaminophen-caffeine (FIORICET) 50-325-40 MG per tablet 3 (three) times daily.     Historical Provider, MD  carvedilol (COREG)  6.25 MG tablet Take 1 tablet (6.25 mg total) by mouth 2 (two) times daily. 04/10/14   Rollene Rotunda, MD  divalproex (DEPAKOTE) 500 MG EC tablet Take 500 mg by mouth 3 (three) times daily. Prescribed by  his neurologist in [facility]    Historical Provider, MD  docusate sodium (COLACE) 100 MG capsule Take 1 capsule (100 mg total) by mouth 2 (two) times daily as needed for mild constipation. 03/30/14   Stevie Kern, MD  furosemide (LASIX) 40 MG tablet TAKE ONE TABLET BY MOUTH EVERY OTHER DAY    Rollene Rotunda, MD  lisinopril (PRINIVIL,ZESTRIL) 5 MG tablet Take 1 tablet (5 mg total) by mouth daily. 05/14/14   Rollene Rotunda, MD  ondansetron (ZOFRAN) 4 MG tablet Take 1 tablet (4 mg total) by mouth every 8 (eight) hours as needed for nausea or vomiting. 11/11/14   Tommie Sams, DO  oxyCODONE-acetaminophen (PERCOCET/ROXICET) 5-325 MG per tablet Take 1 tablet by mouth every 6 (six) hours as needed for severe pain. 03/30/14   Stevie Kern, MD  pravastatin (PRAVACHOL) 40 MG tablet Take 1 tablet (40 mg total) by mouth daily. 05/14/14   Rollene Rotunda, MD  ranitidine (ZANTAC) 150 MG capsule Take 150 mg by mouth 2 (two) times daily.    Historical Provider, MD   BP 81/56 mmHg  Pulse 70  Temp(Src) 99.2 F (37.3 C) (Rectal)  Resp 18  Ht  (1.905 m)  Wt 150 lb (68.04 kg)  BMI 18.75 kg/m2  SpO2 100% Physical Exam  Nursing note and vitals reviewed.   ED Course  Procedures (including critical care time) Labs Review Labs Reviewed  CBC WITH DIFFERENTIAL/PLATELET - Abnormal; Notable for the following:    WBC 21.1 (*)    RBC 3.52 (*)    Hemoglobin 12.1 (*)    HCT 34.5 (*)    MCH 34.4 (*)    Neutro Abs 15.8 (*)    Lymphocytes Relative 9 (*)    Monocytes Relative 16 (*)    Monocytes Absolute 3.4 (*)    All other components within normal limits  COMPREHENSIVE METABOLIC PANEL  LIPASE, BLOOD    Imaging Review No results found.   EKG Interpretation None       CRITICAL CARE Performed by:  [name]   Total critical care time: 5-0  Critical care time was exclusive of separately billable procedures and treating other patients.  Critical care was necessary to treat or prevent imminent or life-threatening deterioration.  Critical care was time spent personally by me on the following activities: development of treatment plan with patient and/or surrogate as well as nursing, discussions with consultants, evaluation of patient's response to treatment, examination  of patient, obtaining history from patient or surrogate, ordering and performing treatments and interventions, ordering and review of laboratory studies, ordering and review of radiographic studies, pulse oximetry and re-evaluation of patient's condition.  MDM   Final diagnoses:  Abdominal pain  Acute pancreatitis, unspecified pancreatitis type  Acute renal failure, unspecified acute renal failure type  Leukocytosis  Hyponatremia    4:23 PM BP 92/34 mmHg  Pulse 73  Temp(Src) 99.2 F (37.3 C) (Rectal)  Resp 18  Ht 6\' 3"  (1.905 m)  Wt 150 lb (68.04 kg)  BMI 18.75 kg/m2  SpO2 100% Patient here with altered mental status, hypotension, leukocytosis of 21,000. Patient called as a level Rivera. Sepsis. He is hyponatremic to 1:30. Slightly elevated glucose. The patient also appears to be in acute renal failure. Lipase of 157. Patient has received 2 L of fluid and has raised his blood pressure to systolic of 93. Review of the flow sheet on the patient. Should he normally has a systolic pressure in the low 100s to high 90s.   She CT scan shows acute pancreatitis with evidence of either pseudocyst or abscess collection. Patient will be admitted to stepdown by family medicine. He is stable throughout his ED visit with fluid resuscitation. Bank and Zosyn given within the first hour. Her significant deterioration.     Austin Captainbigail Coulton Schlink, PA-C 02/21/15 1206  Blake DivineJohn Wofford, MD 02/23/15 [phone]

## 2015-02-20 NOTE — ED Notes (Addendum)
Pt c/o intermittent abdominal pain since Thursday. Visitor reports pt was disoriented starting Thursday becoming worse today.

## 2015-02-20 NOTE — ED Notes (Addendum)
Austin PikesSusan- pts wife would like to be called with updates: [phone]

## 2015-02-20 NOTE — ED Notes (Signed)
Dr. Loretha StaplerWofford was notified of the patients critical I-stat chem 8.

## 2015-02-20 NOTE — Progress Notes (Signed)
ANTIBIOTIC CONSULT NOTE - INITIAL  Pharmacy Consult for Vancomycin & Zosyn Indication: rule out pneumonia  Allergies  Allergen Reactions  . Aspirin     To much aspirin make his bleed on the inside can take a small amount.    Patient Measurements: Height: 6\' 3"  (190.5 cm) Weight: 150 lb (68.04 kg) IBW/kg (Calculated) : 84.5  Vital Signs: Temp: 99.2 F (37.3 C) (04/30 1454) Temp Source: Rectal (04/30 1454) BP: 92/34 mmHg (04/30 1600) Pulse Rate: 73 (04/30 1600) Intake/Output from previous day:   Intake/Output from this shift:    Labs:  Recent Labs  02/20/15 1419 02/20/15 1529  WBC 21.1*  --   HGB 12.1* 12.9*  PLT 182  --   CREATININE 2.27* 2.10*   Estimated Creatinine Clearance: 33.7 mL/min (by C-G formula based on Cr of 2.1). No results for input(s): VANCOTROUGH, VANCOPEAK, VANCORANDOM, GENTTROUGH, GENTPEAK, GENTRANDOM, TOBRATROUGH, TOBRAPEAK, TOBRARND, AMIKACINPEAK, AMIKACINTROU, AMIKACIN in the last 72 hours.   Microbiology: No results found for this or any previous visit (from the past 720 hour(s)).  Medical History: Past Medical History  Diagnosis Date  . Seizure disorder   . Nausea   . History of heartburn   . Chronic back pain   . IHD (ischemic heart disease)   . Cigarette smoker   . Hypercholesterolemia   . CHF (congestive heart failure)     EF had been 20% initially. 2010 EF 35-40%.  . Traumatic brain injury   . Seizures   . COPD (chronic obstructive pulmonary disease)     Medications:   (Not in a hospital admission) Assessment: 64 yo admitted 02/20/2015  With severe abdominal pain, AMS, low BP, recent treatment for sympoms of a sinus infection including fever, chills outpatient treatment with amoxicillin.  Pharmacy consulted to dose vancomycin and zosyn  PMH: seizure disorder, TBI, IHD, hyperlipidemia, CHF EF 20%, COPD  Goal of Therapy:  Vancomycin trough level 15-20 mcg/ml  Plan:  Zosyn 3.375g IV q8h infuse over 4h  Vancomycin 1g IV  q24h Follow up SCr, UOP, cultures, clinical course and adjust as clinically indicated.   Thank you for allowing pharmacy to be a part of this patients care team.  Lovenia KimJulie Aiden Rao Pharm.D., BCPS, AQ-Cardiology Clinical Pharmacist 02/20/2015 4:34 PM Pager: [phone] Phone: [phone]

## 2015-02-21 DIAGNOSIS — S069XAA Unspecified intracranial injury with loss of consciousness status unknown, initial encounter: Secondary | ICD-10-CM | POA: Insufficient documentation

## 2015-02-21 DIAGNOSIS — I509 Heart failure, unspecified: Secondary | ICD-10-CM

## 2015-02-21 DIAGNOSIS — I5043 Acute on chronic combined systolic (congestive) and diastolic (congestive) heart failure: Secondary | ICD-10-CM | POA: Insufficient documentation

## 2015-02-21 DIAGNOSIS — I5022 Chronic systolic (congestive) heart failure: Secondary | ICD-10-CM

## 2015-02-21 DIAGNOSIS — S069X9A Unspecified intracranial injury with loss of consciousness of unspecified duration, initial encounter: Secondary | ICD-10-CM | POA: Insufficient documentation

## 2015-02-21 DIAGNOSIS — F4310 Post-traumatic stress disorder, unspecified: Secondary | ICD-10-CM | POA: Insufficient documentation

## 2015-02-21 DIAGNOSIS — E871 Hypo-osmolality and hyponatremia: Secondary | ICD-10-CM | POA: Insufficient documentation

## 2015-02-21 DIAGNOSIS — N179 Acute kidney failure, unspecified: Secondary | ICD-10-CM | POA: Insufficient documentation

## 2015-02-21 LAB — URINE CULTURE
Colony Count: NO GROWTH
Culture: NO GROWTH

## 2015-02-21 LAB — BASIC METABOLIC PANEL
Anion gap: 12 (ref 5–15)
BUN: 29 mg/dL — AB (ref 6–20)
CO2: 20 mmol/L — ABNORMAL LOW (ref 22–32)
Calcium: 8.3 mg/dL — ABNORMAL LOW (ref 8.9–10.3)
Chloride: 102 mmol/L (ref 101–111)
Creatinine, Ser: 1.75 mg/dL — ABNORMAL HIGH (ref 0.61–1.24)
GFR calc Af Amer: 45 mL/min — ABNORMAL LOW (ref >60)
GFR calc non Af Amer: 39 mL/min — ABNORMAL LOW (ref >60)
Glucose, Bld: 94 mg/dL (ref 70–99)
Potassium: 4.4 mmol/L (ref 3.5–5.1)
Sodium: 134 mmol/L — ABNORMAL LOW (ref 135–145)

## 2015-02-21 LAB — CBC
HEMATOCRIT: 31.5 % — AB (ref 39.0–52.0)
Hemoglobin: 11 g/dL — ABNORMAL LOW (ref 13.0–17.0)
MCH: 34.3 pg — ABNORMAL HIGH (ref 26.0–34.0)
MCHC: 34.9 g/dL (ref 30.0–36.0)
MCV: 98.1 fL (ref 78.0–100.0)
PLATELETS: 133 10*3/uL — AB (ref 150–400)
RBC: 3.21 MIL/uL — ABNORMAL LOW (ref 4.22–5.81)
RDW: 12.4 % (ref 11.5–15.5)
WBC: 14.4 10*3/uL — ABNORMAL HIGH (ref 4.0–10.5)

## 2015-02-21 LAB — LIPID PANEL
Cholesterol: 79 mg/dL (ref 0–200)
HDL: 28 mg/dL — AB (ref >40)
LDL Cholesterol: 38 mg/dL (ref 0–99)
TRIGLYCERIDES: 63 mg/dL (ref <150)
Total CHOL/HDL Ratio: 2.8 RATIO
VLDL: 13 mg/dL (ref 0–40)

## 2015-02-21 LAB — RAPID URINE DRUG SCREEN, HOSP PERFORMED
Amphetamines: NOT DETECTED
Barbiturates: NOT DETECTED
Benzodiazepines: NOT DETECTED
Cocaine: NOT DETECTED
OPIATES: NOT DETECTED
Tetrahydrocannabinol: NOT DETECTED

## 2015-02-21 LAB — ETHANOL

## 2015-02-21 MED ORDER — FENTANYL CITRATE (PF) 100 MCG/2ML IJ SOLN
12.5000 ug | INTRAMUSCULAR | Status: DC | PRN
Start: 2015-02-21 — End: 2015-02-21

## 2015-02-21 MED ORDER — MORPHINE SULFATE 2 MG/ML IJ SOLN
1.0000 mg | INTRAMUSCULAR | Status: DC | PRN
  Administered 2015-02-21 – 2015-02-22 (×2): 1 mg via INTRAVENOUS
  Filled 2015-02-21 (×2): qty 1

## 2015-02-21 NOTE — Discharge Summary (Signed)
Family Medicine Teaching [facility] Discharge Summary  Patient name: [name] Medical record number: [id number] Date of birth: 1951-05-13 Age: 64 y.o. Gender: male Date of Admission: 02/20/2015  Date of Discharge: 02/26/2015 Admitting Physician: Moses Manners, MD  Primary Care Provider: Everlene Other, DO Consultants: General Surgery  Indication for Hospitalization: Acute on Chronic Pancreatitis  Discharge Diagnoses/Problem List:  Pancreatitis  Disposition: Home with Home Health PT  Discharge Condition: Stable   Discharge Exam:  General: NAD, AAOx3 Cardiovascular: S1 and S2 noted. No murmurs. RRR Respiratory: CTA Bilatearlly, Appropriate rate, unlabored.  Abdomen: No TTP, S, ND, +BS, no peritoneal signs.  Extremities: WWP, no edema to exam, 2+ distal pulses.  Neuro: Oriented x 3, no focal deficits. [facility] Hospital Course:  [name] is a 64yo male who presented to the Emergency Department on 4/30 with abdominal pain. qSOFA noted to be 2 at admission due to altered mental status and low systolic blood pressure. Lactic Acid initially elevated to 2.64, but improved to 1.44 in the Emergency Department after fluids were given. Lipase noted to be elevated to 157. Normal LFTs and Alkaline Phosphatase. CT showed acute on chronic pancreatitis with possible abscess or pseudocyst. Vancomycin and Zosyn initiated in ED. Admitted to [facility] Medicine Teaching Service for treatment of Pancreatitis.  [name] was made NPO at admission. Zosyn continued and gentle IV fluids given due to history of HF with EF 30-35%. Initial lab work up included lipid panel, which showed cholesterol 79, triglycerides 63, HDL 28, and LDL38. UDS negative and ethanol level normal. Blood and urine cultures were negative throughout his hospitalization. General Surgery consulted due to possible abscess or pseudocyst and recommended continuation of antibiotics without surgical intervention they signed off shortly  thereafter as he was improving with little intervention. Zosyn was discontinued on HD 3. He tolerated advancement of his diet without a problem. During his hospitalization, he did develop acute urinary retention that required placement of a foley catheter. Flomax was started, and catheter was removed 3 days later. Patient did not have any difficulty urinating on his own once the catheter was pulled. Additionally, on hospital day 4 he developed acute shortness of breath and a small oxygen requirement. Chest x-Marchetta revealed pulmonary edema, and he was found to be fluid overloaded likely as a result of a reduced cardiac capacity in the setting of getting IVF for his pancreatitis. BNP was found to be > 4500. He was given lasix which helped him to reduce his volume to a manageable state for him. At discharge he did not have any further shortness of breath, and he no longer had an O2 requirement. Crackles that were initially noted on his lungs were no longer there. Given that he was tolerating a full diet, his fluid overload was resolved, and he was able to urinate on his own, he was found to be safe for discharge with home health physical therapy as an outpatient.    During this hospitalization he was noted to have severe protein calorie malnutrition. He is recommended to start nutritional supplementation as an outpatient. Additionally, he has had diarrhea for > 3 months according to his wife. He has not had any here. This may have been due to the pancreatitis, but he will need to be followed for pancreatic insufficiency given his protein calorie malnutrition.   Issues for Follow Up:  1. BMET to follow up creatinine and electrolytes.  2. Follow up his diet nutrition status 3. Follow up  improvement with Physical therapy 4. Follow up chronic diarrhea, if no resolution consider further workup vs. GI consult.   Significant Procedures: None  Significant Labs and Imaging:   Recent Labs Lab 02/24/15 0538  02/25/15 0810 02/26/15 1020  WBC 13.6* 14.6* 15.5*  HGB 11.7* 11.2* 11.4*  HCT 33.5* 31.7* 32.1*  PLT 109* 113* 110*    Recent Labs Lab 02/20/15 1419  02/22/15 1112 02/23/15 1100 02/24/15 0538 02/25/15 0810 02/26/15 1020  NA 130*  < > 139 138 138 134* 132*  K 4.2  < > 3.7 4.0 3.4* 3.5 3.1*  CL 93*  < > 109 107 105 102 96*  CO2 24  < > 22 18* 21* 22 26  GLUCOSE 106*  < > 81 106* 94 95 89  BUN 35*  < > 19 18 22* 36* 40*  CREATININE 2.27*  < > 1.42* 1.25* 1.26* 1.36* 1.41*  CALCIUM 8.9  < > 8.6* 8.7* 8.6* 8.5* 8.3*  ALKPHOS 50  --  41  --   --   --   --   AST 15  --  15  --   --   --   --   ALT 7  --  8*  --   --   --   --   ALBUMIN 2.9*  --  1.8*  --   --   --   --   < > = values in this interval not displayed. Urinalysis    Component Value Date/Time   COLORURINE AMBER* 02/20/2015 1614   APPEARANCEUR CLEAR 02/20/2015 1614   LABSPEC 1.024 02/20/2015 1614   PHURINE 5.0 02/20/2015 1614   GLUCOSEU NEGATIVE 02/20/2015 1614   HGBUR NEGATIVE 02/20/2015 1614   BILIRUBINUR NEGATIVE 02/20/2015 1614   KETONESUR NEGATIVE 02/20/2015 1614   PROTEINUR NEGATIVE 02/20/2015 1614   UROBILINOGEN 1.0 02/20/2015 1614   NITRITE NEGATIVE 02/20/2015 1614   LEUKOCYTESUR NEGATIVE 02/20/2015 1614   - Lipid Panel: Cholesterol 79, Triglycerides 63, HDL 28, LDL 38 - Lactic Acid > 1.44 - Lipase 157 - - BNP > 4500 - Troponin 0.49 > 0.47 > 0.34 > 0.25   2D Echocardiogram 5/1:  Study Conclusions  - Left ventricle: The cavity size was normal. Wall thickness was increased in a pattern of moderate LVH. Systolic function was mildly to moderately reduced. The estimated ejection fraction was in the range of 40% to 45%. Basal to mid inferior and apical akinesis. Doppler parameters are consistent with abnormal left ventricular relaxation (grade 1 diastolic dysfunction). The E/e&' ratio is <8, suggesting normal LV filling pressure. - Mitral valve: Calcified annulus. There was trivial  regurgitation. - Left atrium: The atrium was normal in size. - Atrial septum: There was increased thickness of the septum, consistent with lipomatous hypertrophy. - Inferior vena cava: The vessel was normal in size. The respirophasic diameter changes were in the normal range (>= 50%), consistent with normal central venous pressure.  Results/Tests Pending at Time of Discharge: None  Discharge Medications:    Medication List    STOP taking these medications        amoxicillin 500 MG capsule  Commonly known as:  AMOXIL      TAKE these medications        acetaminophen 650 MG CR tablet  Commonly known as:  TYLENOL  Take 650 mg by mouth every 8 (eight) hours as needed for pain.     aspirin 325 MG tablet  Take 325 mg by mouth daily.  carvedilol 6.25 MG tablet  Commonly known as:  COREG  Take 1 tablet (6.25 mg total) by mouth 2 (two) times daily.     divalproex 500 MG DR tablet  Commonly known as:  DEPAKOTE  Take 500 mg by mouth 3 (three) times daily. Prescribed by  his neurologist in [facility]     docusate sodium 100 MG capsule  Commonly known as:  COLACE  Take 1 capsule (100 mg total) by mouth 2 (two) times daily as needed for mild constipation.     feeding supplement (ENSURE ENLIVE) Liqd  Take 237 mLs by mouth 3 (three) times daily between meals.     furosemide 40 MG tablet  Commonly known as:  LASIX  TAKE ONE TABLET BY MOUTH EVERY OTHER DAY     lisinopril 5 MG tablet  Commonly known as:  PRINIVIL,ZESTRIL  Take 1 tablet (5 mg total) by mouth daily.     ondansetron 4 MG tablet  Commonly known as:  ZOFRAN  Take 1 tablet (4 mg total) by mouth every 8 (eight) hours as needed for nausea or vomiting.     oxyCODONE-acetaminophen 5-325 MG per tablet  Commonly known as:  PERCOCET/ROXICET  Take 1 tablet by mouth every 6 (six) hours as needed for severe pain.     pravastatin 40 MG tablet  Commonly known as:  PRAVACHOL  Take 1 tablet (40 mg total) by mouth  daily.     ranitidine 150 MG capsule  Commonly known as:  ZANTAC  Take 300 mg by mouth daily.     tamsulosin 0.4 MG Caps capsule  Commonly known as:  FLOMAX  Take 1 capsule (0.4 mg total) by mouth daily.        Discharge Instructions: Please refer to Patient Instructions section of EMR for full details.  Patient was counseled important signs and symptoms that should prompt return to medical care, changes in medications, dietary instructions, activity restrictions, and follow up appointments.   Follow-Up Appointments: Follow-up Information    Follow up with Everlene Other, DO. Go on 03/04/2015.   Specialty:  Family Medicine   Why:  Hospital Follow Up - 10:30 am.    Contact information:   [address] [place] [address] [phone]       Yolande Jolly, MD 02/26/2015, 2:49 PM PGY-1, [facility] Family [facility]

## 2015-02-21 NOTE — Progress Notes (Signed)
Pt. Tried multiple times to void but unsuccessful, bladder scanned x2 shows 300-43060ml. Of urine. Bladder distended, Dr. Orie Fishermanamley made aware with orders to in and out cath. Obtained 450ml. Of urine in and out cath. And pt. Felt better. Will continue to monitor pt.

## 2015-02-21 NOTE — ED Provider Notes (Signed)
Medical screening examination/treatment/procedure(s) were conducted as a shared visit with non-physician practitioner(s) and myself.  I personally evaluated the patient during the encounter.   EKG Interpretation None      64 yo male presenting with confusion, abdominal pain, and subjective fevers.  His wife says his confusion seems to wax and wane.  At time of my exam, alert, oriented, nontoxic but uncomfortable, normal respiratory effort, normal perfusion, abdomen soft but tender diffusely.  His blood pressures are chronically low, but more pronounced today.  Therefore, treated as presumed sepsis given his report of fevers at home.  Also, CT obtained which showed pancreatitis and showed his AAA to be stable in appearance.  IV fluids improved BPs.  [facility]   Clinical Impression: 1. Acute pancreatitis, unspecified pancreatitis type   2. Abdominal pain   3. Acute renal failure, unspecified acute renal failure type   4. Leukocytosis   5. Hyponatremia         Blake DivineJohn Marry Kusch, MD 02/21/15 [phone]

## 2015-02-21 NOTE — Progress Notes (Signed)
Family Medicine Teaching Service Daily Progress Note Intern Pager: [phone]  Patient name: [name] Medical record number: [id number] Date of birth: 1951-02-20 Age: 64 y.o. Gender: male  Primary Care Provider: Everlene Other, DO Consultants: Surgery Code Status: Full  Assessment and Plan: 64 y.o. male presenting with abdominal pain found to have acute on chronic pancreatitis. PMH is significant for HFrEF, CAD s/p CABG, COPD, TBI, seizures, PTSD.  # Acute on Chronic Pancreatitis. qSOFA 2 on admission (SBP and mental status). Meets 1 SIRS criteria. BISAP score of 3. Ransons of 2, but LDH not obtained. Unclear cause of chronic pancreatitis - patient has never been diagnosed with this prior. No history of heavy alcohol use. Normal LFTS and alkaline phosphatase make gallstone etiology less likely. Possibly related to remote intra-abdominal injury, though no recent traumas to explain acute flare. Also possibly ischemic in nature given history of severe atherosclerosis.  - Lipid Panel: Cholesterol 79, Triglycerides 63, HDL 28, LDL 38 - Lactic Acid 2.64> 1.44 - Lipase 157 - Follow up blood and urine cultures - CT with acute on chronic pancreatitis with possible abscess or pseudocyst - Low threshold for consulting CCM - Given Vancomycin and Zosyn in ED. Continue Zosyn due to potential abscess (4/30>>) - Pain control with IV fentanyl - Tylenol PRN fever - Gentle IVF--D5 NS  - Surgery consulted in ED, appreciate assistance - Cardiac monitoring - NPO  # HFrEF. Echo in 2013 with EF of 30 to 35%. Has refused ICD placement in the past. Currently euvolemic - Follow up Echocardiogram - Will be cautious to avoid fluid overload.  - Hold home coreg, lasix, and lisinopril while critically ill, restart as blood pressure allows  # Urinary Retention - Required in-and-out catheterization for urination. Bladder scan showed  300-443mL prior to catheterization - Creatinine improved to 1.75 from  2.10 - Continue to monitor  # CAD / AAA. Patient with 6-vessel CABG in 2006. AAA diagnosed this year and is currently being managed by vascular surgery as outpatient. - Continue home aspirin - Restart home statin when taking PO  # AKI on CKD. Cr 2.27 on admission. Baseline 1.2-1.5. Likely pre-renal in setting of poor PO intake and chronic diarrhea - Creatinine improved to 1.75  - Gentle IVF - Continue to mointor  # COPD. Not on any medications - Continue to monitor  # Chronic Diarrhea. Unclear etiology. May be related to ischemic mesentery disease or ischemic colitis. Unlikely to be c diff, though possible given recent antibiotic. - Consider c diff PCR and FOBT here - Consider GI referral as outpatient if persists.   # TBI / Seizure disorder / PTSD - Continue home depakote - Consider checking valproate level if continues to be lethargic  # Sinusitis: recent URI symptoms, started on amoxicillin for sinus infection on 4/28 - Hold amox while NPO - Consider restarting when tolerating PO or on discharge - Monitor URI symptoms  FEN/GI: NPO, D5 NS @ 100cc/hr Prophylaxis: SubQ heparin  Disposition: Admitted to [facility] Medicine Teaching Service, Hensel attending, currently in SDU  Subjective:  Denies abdominal pain. Complains of chills. Continues to be altered.  Initially presented to ED with abdominal pain and altered mental status. Noted intermittent abdominal pain x3-4 months. Increasing in intensity over the last three days. Wife states he tends to become confused with minimal pain.  Objective: Temp:  [97.5 F (36.4 C)-100.7 F (38.2 C)] 100.7 F (38.2 C) (05/01 0432) Pulse Rate:  [65-77] 73 (05/01 0432) Resp:  [14-22] 21 (05/01  0432) BP: (77-109)/(33-70) 93/50 mmHg (05/01 0432) SpO2:  [99 %-100 %] 100 % (05/01 0432) Weight:  [148 lb 5.9 oz (67.3 kg)-150 lb (68.04 kg)] 148 lb 5.9 oz (67.3 kg) (04/30 1825) Physical Exam: General: 63yo male sleeping comfortably. Appears  agitated and begins shaking when awakened. Cardiovascular: S1 and S2 noted. No murmurs. Regular rate and rhythm Respiratory: Clear to auscultation bilaterally, no increased work of breathing Abdomen: Epigastric tenderness, voluntary guarding Extremities: No edema noted  Laboratory:  Recent Labs Lab 02/20/15 1419 02/20/15 1529 02/21/15 0314  WBC 21.1*  --  14.4*  HGB 12.1* 12.9* 11.0*  HCT 34.5* 38.0* 31.5*  PLT 182  --  133*    Recent Labs Lab 02/20/15 1419 02/20/15 1529 02/21/15 0314  NA 130* 130* 134*  K 4.2 6.1* 4.4  CL 93* 72* 102  CO2 24  --  20*  BUN 35* 56* 29*  CREATININE 2.27* 2.10* 1.75*  CALCIUM 8.9  --  8.3*  PROT 6.8  --   --   BILITOT 0.7  --   --   ALKPHOS 50  --   --   ALT 7  --   --   AST 15  --   --   GLUCOSE 106* 103* 94  - Troponin negative - Lipid Panel: Total Cholesterol 79, Triglycerides 63, HDL 28, LDL 38 - Lactic Acid 1.44 - UA: normal  Imaging/Diagnostic Tests: Ct Abdomen Pelvis Wo Contrast  02/20/2015   CLINICAL DATA:  Intermittent abdominal pain for 3 days, disoriented, chills  EXAM: CT ABDOMEN AND PELVIS WITHOUT CONTRAST  TECHNIQUE: Multidetector CT imaging of the abdomen and pelvis was performed following the standard protocol without IV contrast.  COMPARISON:  01/05/2015  FINDINGS: Liver and gallbladder are normal. Moderate inflammatory change seen around the pancreas. Numerous pancreatic calcifications. 1.5-2 cm oval low-attenuation lesion in the body of the pancreas, new from prior study. Spleen and adrenal glands normal. Right kidney normal. Severe left renal atrophy stable. Stable abdominal aortic aneurysm in terms of size and appearance measuring up to 48 x 41 mm. No acute abnormalities involving smaller large bowel or stomach. Bladder is normal. Trace free fluid in the pelvis.  Heavy coronary artery calcification. Visualized portions of the lung bases clear. No acute osseous abnormalities.  IMPRESSION: Moderate inflammatory change  involving the pancreas consistent with pancreatitis, acute, superimposed on chronic. Low-attenuation lesion in the body of the pancreas may represent pancreatic abscess or pseudocyst.  Other nonacute findings described above.   Electronically Signed   By: Esperanza Heiraymond  Rubner M.D.   On: 02/20/2015 16:24   Dg Chest Port 1 View  02/20/2015   CLINICAL DATA:  Severe abdominal pain. Lethargy. COPD. CHF. Low blood pressure.  EXAM: PORTABLE CHEST - 1 VIEW  COMPARISON:  03/08/2007 and 03/30/2014  FINDINGS: Patient rotated right. Prior median sternotomy. Normal heart size. No pleural effusion or pneumothorax. Clear lungs.  IMPRESSION: No acute cardiopulmonary disease.   Electronically Signed   By: Jeronimo GreavesKyle  Talbot M.D.   On: 02/20/2015 18:25   Araceli Bouchealeigh N Rumley, DO 02/21/2015, 6:17 AM PGY-1, [place] [facility] FPTS Intern pager: [phone], text pages welcome

## 2015-02-21 NOTE — Progress Notes (Signed)
  Echocardiogram 2D Echocardiogram has been performed.  [name], [name] 02/21/2015, 11:26 AM

## 2015-02-21 NOTE — Consult Note (Signed)
Reason for Consult: Acute on chronic pancreatitis Referring Physician: [name] is an 64 y.o. male.  HPI: [name] has a history of multiple medical problems including traumatic brain injury and congestive heart failure. He is admitted with pancreatitis. CT demonstrates a pattern of acute on chronic with a fluid collection. He denies abdominal pain at this time but is a very poor historian.  Past Medical History  Diagnosis Date  . Seizure disorder   . Nausea   . History of heartburn   . Chronic back pain   . IHD (ischemic heart disease)   . Cigarette smoker   . Hypercholesterolemia   . CHF (congestive heart failure)     EF had been 20% initially. 2010 EF 35-40%.  . Traumatic brain injury   . Seizures   . COPD (chronic obstructive pulmonary disease)     Past Surgical History  Procedure Laterality Date  . Cardiac catheterization  03/12/2007  . Coronary artery bypass graft  2000    x6, graft to the PDA and LAD, patent saphenous vein graft to acute marginal and posterior lateral severely diseased saphenous vein graft to diagonal. Patent LIMA to the LAD.  Marland Kitchen Shoulder surgery      right  . Facial reconstruction surgery    . Back surgery      Family History  Problem Relation Age of Onset  . Heart disease Brother   . Hypertension Brother   . Heart attack Brother   . Heart disease Sister   . Coronary artery disease Brother   . Hypertension Brother   . Heart attack Brother   . Coronary artery disease Sister   . Diabetes Brother   . Thyroid disease Brother   . Heart attack Brother   . Diabetes Father   . Heart disease Father   . Hypertension Father   . Heart attack Father   . Hypertension Mother   . Varicose Veins Mother     Social History:  reports that he has been smoking Cigarettes.  He has a 25 pack-year smoking history. He has never used smokeless tobacco. He reports that he does not drink alcohol or use illicit drugs.  Allergies:  Allergies   Allergen Reactions  . Aspirin     To much aspirin make his bleed on the inside can take a small amount.  . Morphine And Related     Medications:  Scheduled: . aspirin  325 mg Oral Daily  . divalproex  500 mg Oral TID  . heparin  5,000 Units Subcutaneous 3 times per day  . piperacillin-tazobactam (ZOSYN)  IV  3.375 g Intravenous 3 times per day  . sodium chloride  3 mL Intravenous Q12H   Continuous: . dextrose 5 % and 0.9% NaCl 100 mL/hr (02/21/15 1040)   WGN:[id number] **OR** acetaminophen, morphine injection  Results for orders placed or performed during the hospital encounter of 02/20/15 (from the past 48 hour(s))  CBC with Differential     Status: Abnormal   Collection Time: 02/20/15  2:19 PM  Result Value Ref Range   WBC 21.1 (H) 4.0 - 10.5 K/uL   RBC 3.52 (L) 4.22 - 5.81 MIL/uL   Hemoglobin 12.1 (L) 13.0 - 17.0 g/dL   HCT 34.5 (L) 39.0 - 52.0 %   MCV 98.0 78.0 - 100.0 fL   MCH 34.4 (H) 26.0 - 34.0 pg   MCHC 35.1 30.0 - 36.0 g/dL   RDW 12.4 11.5 - 15.5 %   Platelets  182 150 - 400 K/uL   Neutrophils Relative % 75 43 - 77 %   Neutro Abs 15.8 (H) 1.7 - 7.7 K/uL   Lymphocytes Relative 9 (L) 12 - 46 %   Lymphs Abs 1.9 0.7 - 4.0 K/uL   Monocytes Relative 16 (H) 3 - 12 %   Monocytes Absolute 3.4 (H) 0.1 - 1.0 K/uL   Eosinophils Relative 0 0 - 5 %   Eosinophils Absolute 0.0 0.0 - 0.7 K/uL   Basophils Relative 0 0 - 1 %   Basophils Absolute 0.0 0.0 - 0.1 K/uL  Comprehensive metabolic panel     Status: Abnormal   Collection Time: 02/20/15  2:19 PM  Result Value Ref Range   Sodium 130 (L) 135 - 145 mmol/L   Potassium 4.2 3.5 - 5.1 mmol/L   Chloride 93 (L) 96 - 112 mmol/L   CO2 24 19 - 32 mmol/L   Glucose, Bld 106 (H) 70 - 99 mg/dL   BUN 35 (H) 6 - 23 mg/dL   Creatinine, Ser 2.27 (H) 0.50 - 1.35 mg/dL   Calcium 8.9 8.4 - 10.5 mg/dL   Total Protein 6.8 6.0 - 8.3 g/dL   Albumin 2.9 (L) 3.5 - 5.2 g/dL   AST 15 0 - 37 U/L   ALT 7 0 - 53 U/L   Alkaline Phosphatase 50  39 - 117 U/L   Total Bilirubin 0.7 0.3 - 1.2 mg/dL   GFR calc non Af Amer 29 (L) >90 mL/min   GFR calc Af Amer 33 (L) >90 mL/min    Comment: (NOTE) The eGFR has been calculated using the CKD EPI equation. This calculation has not been validated in all clinical situations. eGFR's persistently <90 mL/min signify possible Chronic Kidney Disease.    Anion gap 13 5 - 15  Lipase, blood     Status: Abnormal   Collection Time: 02/20/15  2:19 PM  Result Value Ref Range   Lipase 157 (H) 11 - 59 U/L  I-stat troponin, ED (not at [facility])     Status: None   Collection Time: 02/20/15  3:13 PM  Result Value Ref Range   Troponin i, poc 0.00 0.00 - 0.08 ng/mL   Comment 3            Comment: Due to the release kinetics of cTnI, a negative result within the first hours of the onset of symptoms does not rule out myocardial infarction with certainty. If myocardial infarction is still suspected, repeat the test at appropriate intervals.   I-Stat CG4 Lactic Acid, ED (not at [facility])     Status: Abnormal   Collection Time: 02/20/15  3:15 PM  Result Value Ref Range   Lactic Acid, Venous 2.64 (HH) 0.5 - 2.0 mmol/L   Comment NOTIFIED PHYSICIAN   I-stat chem 8, ed     Status: Abnormal   Collection Time: 02/20/15  3:29 PM  Result Value Ref Range   Sodium 130 (L) 135 - 145 mmol/L   Potassium 6.1 (HH) 3.5 - 5.1 mmol/L   Chloride 72 (L) 96 - 112 mmol/L   BUN 56 (H) 6 - 23 mg/dL   Creatinine, Ser 2.10 (H) 0.50 - 1.35 mg/dL   Glucose, Bld 103 (H) 70 - 99 mg/dL   Calcium, Ion 1.07 (L) 1.13 - 1.30 mmol/L   TCO2 23 0 - 100 mmol/L   Hemoglobin 12.9 (L) 13.0 - 17.0 g/dL   HCT 38.0 (L) 39.0 - 52.0 %   Comment NOTIFIED PHYSICIAN  Urinalysis, Routine w reflex microscopic     Status: Abnormal   Collection Time: 02/20/15  4:14 PM  Result Value Ref Range   Color, Urine AMBER (A) YELLOW    Comment: BIOCHEMICALS MAY BE AFFECTED BY COLOR   APPearance CLEAR CLEAR   Specific Gravity, Urine 1.024 1.005 - 1.030   pH 5.0  5.0 - 8.0   Glucose, UA NEGATIVE NEGATIVE mg/dL   Hgb urine dipstick NEGATIVE NEGATIVE   Bilirubin Urine NEGATIVE NEGATIVE   Ketones, ur NEGATIVE NEGATIVE mg/dL   Protein, ur NEGATIVE NEGATIVE mg/dL   Urobilinogen, UA 1.0 0.0 - 1.0 mg/dL   Nitrite NEGATIVE NEGATIVE   Leukocytes, UA NEGATIVE NEGATIVE    Comment: MICROSCOPIC NOT DONE ON URINES WITH NEGATIVE PROTEIN, BLOOD, LEUKOCYTES, NITRITE, OR GLUCOSE <1000 mg/dL.  Urine rapid drug screen (hosp performed)     Status: None   Collection Time: 02/20/15  4:14 PM  Result Value Ref Range   Opiates NONE DETECTED NONE DETECTED   Cocaine NONE DETECTED NONE DETECTED   Benzodiazepines NONE DETECTED NONE DETECTED   Amphetamines NONE DETECTED NONE DETECTED   Tetrahydrocannabinol NONE DETECTED NONE DETECTED   Barbiturates NONE DETECTED NONE DETECTED    Comment:        DRUG SCREEN FOR MEDICAL PURPOSES ONLY.  IF CONFIRMATION IS NEEDED FOR ANY PURPOSE, NOTIFY LAB WITHIN 5 DAYS.        LOWEST DETECTABLE LIMITS FOR URINE DRUG SCREEN Drug Class       Cutoff (ng/mL) Amphetamine      1000 Barbiturate      200 Benzodiazepine   400 Tricyclics       867 Opiates          300 Cocaine          300 THC              50   I-Stat CG4 Lactic Acid, ED (not at [facility])     Status: None   Collection Time: 02/20/15  5:59 PM  Result Value Ref Range   Lactic Acid, Venous 1.44 0.5 - 2.0 mmol/L  MRSA PCR Screening     Status: None   Collection Time: 02/20/15  6:37 PM  Result Value Ref Range   MRSA by PCR NEGATIVE NEGATIVE    Comment:        The GeneXpert MRSA Assay (FDA approved for NASAL specimens only), is one component of a comprehensive MRSA colonization surveillance program. It is not intended to diagnose MRSA infection nor to guide or monitor treatment for MRSA infections.   CBC     Status: Abnormal   Collection Time: 02/21/15  3:14 AM  Result Value Ref Range   WBC 14.4 (H) 4.0 - 10.5 K/uL   RBC 3.21 (L) 4.22 - 5.81 MIL/uL   Hemoglobin 11.0 (L)  13.0 - 17.0 g/dL   HCT 31.5 (L) 39.0 - 52.0 %   MCV 98.1 78.0 - 100.0 fL   MCH 34.3 (H) 26.0 - 34.0 pg   MCHC 34.9 30.0 - 36.0 g/dL   RDW 12.4 11.5 - 15.5 %   Platelets 133 (L) 150 - 400 K/uL  Lipid panel     Status: Abnormal   Collection Time: 02/21/15  3:14 AM  Result Value Ref Range   Cholesterol 79 0 - 200 mg/dL   Triglycerides 63 <150 mg/dL   HDL 28 (L) >40 mg/dL   Total CHOL/HDL Ratio 2.8 RATIO   VLDL 13 0 - 40 mg/dL   LDL  Cholesterol 38 0 - 99 mg/dL    Comment:        Total Cholesterol/HDL:CHD Risk Coronary Heart Disease Risk Table                     Men   Women  1/2 Average Risk   3.4   3.3  Average Risk       5.0   4.4  2 X Average Risk   9.6   7.1  3 X Average Risk  23.4   11.0        Use the calculated Patient Ratio above and the CHD Risk Table to determine the patient's CHD Risk.        ATP III CLASSIFICATION (LDL):  <100     mg/dL   Optimal  100-129  mg/dL   Near or Above                    Optimal  130-159  mg/dL   Borderline  160-189  mg/dL   High  >190     mg/dL   Very High   Basic metabolic panel     Status: Abnormal   Collection Time: 02/21/15  3:14 AM  Result Value Ref Range   Sodium 134 (L) 135 - 145 mmol/L   Potassium 4.4 3.5 - 5.1 mmol/L    Comment: DELTA CHECK NOTED   Chloride 102 101 - 111 mmol/L   CO2 20 (L) 22 - 32 mmol/L   Glucose, Bld 94 70 - 99 mg/dL   BUN 29 (H) 6 - 20 mg/dL   Creatinine, Ser 1.75 (H) 0.61 - 1.24 mg/dL   Calcium 8.3 (L) 8.9 - 10.3 mg/dL   GFR calc non Af Amer 39 (L) >60 mL/min   GFR calc Af Amer 45 (L) >60 mL/min    Comment: (NOTE) The eGFR has been calculated using the CKD EPI equation. This calculation has not been validated in all clinical situations. eGFR's persistently <90 mL/min signify possible Chronic Kidney Disease.    Anion gap 12 5 - 15    Ct Abdomen Pelvis Wo Contrast  02/20/2015   CLINICAL DATA:  Intermittent abdominal pain for 3 days, disoriented, chills  EXAM: CT ABDOMEN AND PELVIS WITHOUT  CONTRAST  TECHNIQUE: Multidetector CT imaging of the abdomen and pelvis was performed following the standard protocol without IV contrast.  COMPARISON:  01/05/2015  FINDINGS: Liver and gallbladder are normal. Moderate inflammatory change seen around the pancreas. Numerous pancreatic calcifications. 1.5-2 cm oval low-attenuation lesion in the body of the pancreas, new from prior study. Spleen and adrenal glands normal. Right kidney normal. Severe left renal atrophy stable. Stable abdominal aortic aneurysm in terms of size and appearance measuring up to 48 x 41 mm. No acute abnormalities involving smaller large bowel or stomach. Bladder is normal. Trace free fluid in the pelvis.  Heavy coronary artery calcification. Visualized portions of the lung bases clear. No acute osseous abnormalities.  IMPRESSION: Moderate inflammatory change involving the pancreas consistent with pancreatitis, acute, superimposed on chronic. Low-attenuation lesion in the body of the pancreas may represent pancreatic abscess or pseudocyst.  Other nonacute findings described above.   Electronically Signed   By: Skipper Cliche M.D.   On: 02/20/2015 16:24   Dg Chest Port 1 View  02/20/2015   CLINICAL DATA:  Severe abdominal pain. Lethargy. COPD. CHF. Low blood pressure.  EXAM: PORTABLE CHEST - 1 VIEW  COMPARISON:  03/08/2007 and 03/30/2014  FINDINGS: Patient rotated  right. Prior median sternotomy. Normal heart size. No pleural effusion or pneumothorax. Clear lungs.  IMPRESSION: No acute cardiopulmonary disease.   Electronically Signed   By: Abigail Miyamoto M.D.   On: 02/20/2015 18:25    Review of Systems  Unable to perform ROS: mental acuity   Blood pressure 105/65, pulse 68, temperature 99.3 F (37.4 C), temperature source Axillary, resp. rate 25, height 6' 1"  (1.854 m), weight 67.3 kg (148 lb 5.9 oz), SpO2 100 %. Physical Exam  Constitutional: He appears well-developed. No distress.  HENT:  Head: Normocephalic.  Right Ear: External  ear normal.  Left Ear: External ear normal.  Nose: Nose normal.  Mouth/Throat: Oropharynx is clear and moist.  Eyes: EOM are normal. Pupils are equal, round, and reactive to light. Right eye exhibits no discharge. Left eye exhibits no discharge.  Neck: Neck supple. No tracheal deviation present.  Cardiovascular: Normal rate, normal heart sounds and intact distal pulses.   Respiratory: Effort normal and breath sounds normal. No stridor. No respiratory distress. He has no wheezes. He has no rales.  GI: Soft. He exhibits no distension. There is no tenderness. There is no rebound and no guarding.  Musculoskeletal: He exhibits no edema or tenderness.  Neurological:  Arouses, answers simple questions, follows commands  Skin: Skin is warm.  Psychiatric:  Flat diminished affect    Assessment/Plan: Acute on chronic pancreatitis with small fluid collection - continue bowel rest and IV antibiotics. I would not proceed with any drainage at this time. His white count is decreased. His fever curve is improving. We will follow clinically.  Christiana E 02/21/2015, 1:41 PM

## 2015-02-22 DIAGNOSIS — D72829 Elevated white blood cell count, unspecified: Secondary | ICD-10-CM

## 2015-02-22 LAB — COMPREHENSIVE METABOLIC PANEL
ALBUMIN: 1.8 g/dL — AB (ref 3.5–5.0)
ALK PHOS: 41 U/L (ref 38–126)
ALT: 8 U/L — ABNORMAL LOW (ref 17–63)
AST: 15 U/L (ref 15–41)
Anion gap: 8 (ref 5–15)
BILIRUBIN TOTAL: 0.5 mg/dL (ref 0.3–1.2)
BUN: 19 mg/dL (ref 6–20)
CO2: 22 mmol/L (ref 22–32)
Calcium: 8.6 mg/dL — ABNORMAL LOW (ref 8.9–10.3)
Chloride: 109 mmol/L (ref 101–111)
Creatinine, Ser: 1.42 mg/dL — ABNORMAL HIGH (ref 0.61–1.24)
GFR calc Af Amer: 58 mL/min — ABNORMAL LOW (ref >60)
GFR calc non Af Amer: 50 mL/min — ABNORMAL LOW (ref >60)
Glucose, Bld: 81 mg/dL (ref 70–99)
POTASSIUM: 3.7 mmol/L (ref 3.5–5.1)
Sodium: 139 mmol/L (ref 135–145)
Total Protein: 5.1 g/dL — ABNORMAL LOW (ref 6.5–8.1)

## 2015-02-22 LAB — CBC
HCT: 27.9 % — ABNORMAL LOW (ref 39.0–52.0)
Hemoglobin: 9.8 g/dL — ABNORMAL LOW (ref 13.0–17.0)
MCH: 34.8 pg — ABNORMAL HIGH (ref 26.0–34.0)
MCHC: 35.1 g/dL (ref 30.0–36.0)
MCV: 98.9 fL (ref 78.0–100.0)
Platelets: 107 10*3/uL — ABNORMAL LOW (ref 150–400)
RBC: 2.82 MIL/uL — AB (ref 4.22–5.81)
RDW: 12.4 % (ref 11.5–15.5)
WBC: 12.1 10*3/uL — ABNORMAL HIGH (ref 4.0–10.5)

## 2015-02-22 MED ORDER — TAMSULOSIN HCL 0.4 MG PO CAPS
0.4000 mg | ORAL_CAPSULE | Freq: Every day | ORAL | Status: DC
  Administered 2015-02-22 – 2015-02-26 (×5): 0.4 mg via ORAL
  Filled 2015-02-22 (×5): qty 1

## 2015-02-22 NOTE — Progress Notes (Addendum)
MD paged regarding pt's lack of urine output this shift. Last urine output was 200cc at 1730 02/21/15. Pt is currently receiving IV fluids at 100/hr. Bladder scan done reveals >450cc. Orders received from MD to wait another hour to attempt urination with patient. If no output, will page MD. Will continue to monitor patient closely.

## 2015-02-22 NOTE — Progress Notes (Signed)
Subjective: Appears comfortable  Objective: Vital signs in last 24 hours: Temp:  [97.8 F (36.6 C)-100 F (37.8 C)] 99.2 F (37.3 C) (05/02 0432) Pulse Rate:  [68-79] 71 (05/02 0800) Resp:  [8-26] 16 (05/02 0800) BP: (94-117)/(55-71) 109/71 mmHg (05/02 0800) SpO2:  [95 %-100 %] 100 % (05/02 0800) Weight:  [71.9 kg (158 lb 8.2 oz)] 71.9 kg (158 lb 8.2 oz) (05/02 0432)    Intake/Output from previous day: 05/01 0701 - 05/02 0700 In: 2553 [I.V.:2403; IV Piggyback:150] Out: 200 [Urine:200] Intake/Output this shift: Total I/O In: 100 [I.V.:100] Out: -   Moderate epigastric tenderness with guarding  Lab Results:   Recent Labs  02/20/15 1419 02/20/15 1529 02/21/15 0314  WBC 21.1*  --  14.4*  HGB 12.1* 12.9* 11.0*  HCT 34.5* 38.0* 31.5*  PLT 182  --  133*   BMET  Recent Labs  02/20/15 1419 02/20/15 1529 02/21/15 0314  NA 130* 130* 134*  K 4.2 6.1* 4.4  CL 93* 72* 102  CO2 24  --  20*  GLUCOSE 106* 103* 94  BUN 35* 56* 29*  CREATININE 2.27* 2.10* 1.75*  CALCIUM 8.9  --  8.3*   PT/INR No results for input(s): LABPROT, INR in the last 72 hours. ABG No results for input(s): PHART, HCO3 in the last 72 hours.  Invalid input(s): PCO2, PO2  Studies/Results: Ct Abdomen Pelvis Wo Contrast  02/20/2015   CLINICAL DATA:  Intermittent abdominal pain for 3 days, disoriented, chills  EXAM: CT ABDOMEN AND PELVIS WITHOUT CONTRAST  TECHNIQUE: Multidetector CT imaging of the abdomen and pelvis was performed following the standard protocol without IV contrast.  COMPARISON:  01/05/2015  FINDINGS: Liver and gallbladder are normal. Moderate inflammatory change seen around the pancreas. Numerous pancreatic calcifications. 1.5-2 cm oval low-attenuation lesion in the body of the pancreas, new from prior study. Spleen and adrenal glands normal. Right kidney normal. Severe left renal atrophy stable. Stable abdominal aortic aneurysm in terms of size and appearance measuring up to 48 x 41  mm. No acute abnormalities involving smaller large bowel or stomach. Bladder is normal. Trace free fluid in the pelvis.  Heavy coronary artery calcification. Visualized portions of the lung bases clear. No acute osseous abnormalities.  IMPRESSION: Moderate inflammatory change involving the pancreas consistent with pancreatitis, acute, superimposed on chronic. Low-attenuation lesion in the body of the pancreas may represent pancreatic abscess or pseudocyst.  Other nonacute findings described above.   Electronically Signed   By: Esperanza Heir M.D.   On: 02/20/2015 16:24   Dg Chest Port 1 View  02/20/2015   CLINICAL DATA:  Severe abdominal pain. Lethargy. COPD. CHF. Low blood pressure.  EXAM: PORTABLE CHEST - 1 VIEW  COMPARISON:  03/08/2007 and 03/30/2014  FINDINGS: Patient rotated right. Prior median sternotomy. Normal heart size. No pleural effusion or pneumothorax. Clear lungs.  IMPRESSION: No acute cardiopulmonary disease.   Electronically Signed   By: Jeronimo Greaves M.D.   On: 02/20/2015 18:25    Anti-infectives: Anti-infectives    Start     Dose/Rate Route Frequency Ordered Stop   02/20/15 1700  vancomycin (VANCOCIN) IVPB 1000 mg/200 mL premix  Status:  Discontinued     1,000 mg 200 mL/hr over 60 Minutes Intravenous Every 24 hours 02/20/15 1640 02/20/15 1835   02/20/15 1645  piperacillin-tazobactam (ZOSYN) IVPB 3.375 g     3.375 g 12.5 mL/hr over 240 Minutes Intravenous 3 times per day 02/20/15 1640        Assessment/Plan: s/p *  No surgery found *  Acute on chronic pancreatitis with small fluid collection representing cyst vs abscess.  Would continue conservative management.  Would re-CT in fever develops or WBC increases.  LOS: 2 days    Kele Withem A 02/22/2015

## 2015-02-22 NOTE — Progress Notes (Signed)
MD notified of patient's inability to urinate. Orders received from MD to attempt to insert foley cath. Previous in-and-out attempts were difficult, will attempt to place foley. Will notify MD if unsuccessful. Will continue to monitor pt closely.

## 2015-02-22 NOTE — Progress Notes (Signed)
Failed attempt to place foley catheter this a.m. due to enlarged prostate. MD notified and Pt bladder scanned. Bladder scanner showed 618cc and pt reports tenderness to touch. Lorin PicketLindsey Corinna Burkman, RN

## 2015-02-22 NOTE — Progress Notes (Signed)
Transfer note:  Arrival Method: Bed from 2C Mental Orientation:A&OX3 lethargic Telemetry: Box [phone] Skin: Dry; intact IV: L forearm D5NS @ 13600ml/hr Pain: Denies Tubes: Foley catheter Safety Measures: Bed in lowest position, bed alarm activated; call light within reach 6700 Orientation: Patient has been oriented to the unit, staff and to the room.  Orders have been reviewed and implemented. Will continue to monitor pt.  Jonell CluckKadeesha Emila Steinhauser, RN

## 2015-02-22 NOTE — Progress Notes (Signed)
Family Medicine Teaching Service Daily Progress Note Intern Pager: [phone]  Patient name: [name] Medical record number: [id number] Date of birth: April 21, 1951 Age: 64 y.o. Gender: male  Primary Care Provider: Everlene Other, DO Consultants: Surgery Code Status: Full  Assessment and Plan: 64 y.o. male presenting with abdominal pain found to have acute on chronic pancreatitis. PMH is significant for HFrEF, CAD s/p CABG, COPD, TBI, seizures, PTSD.  # Acute on Chronic Pancreatitis. qSOFA 2 at admission, BISAP score of 3 on admission. Elevated lipase 157, Lactic acid initially elevated but trended down to 1.44.  Unclear cause of chronic pancreatitis - patient has never been diagnosed with this prior. History unrevealing, UDS negative.  Normal LFTS and alkaline phosphatase make gallstone etiology less likely. Possibly related to remote intra-abdominal injury. ? Ischemic pancreatitis. Also has had chronic diarrhea x several months etiology vs. Symptom. Clinically stable at this time. Pain continues. Small Abscess vs. Pseudocyst on CT not requiring intervention at this time. Afebrile.  - Lipid Panel: Cholesterol 79, Triglycerides 63, HDL 28, LDL 38 - Blood and urine cultures NGTD - Monitor electrolytes. Stable for now.   - Zosyn per pharm (4/30 > 5/2). Will discontinue today.  - continue pain control with morphine prn.  - Tylenol PRN fever > monitoring fever curve. Consider re-CT if clinically worsening.  - Will advance diet to thin liquids.  - Continue IVF D5 NS at 100cc/hr.  - Surgery consulted > appreciate recs. Follow clinically for now.  - Transfer to telemetry.   # HFrEF. Echo in 2013 with EF of 30 to 35%. Has refused ICD placement in the past. Currently euvolemic - Repeat Echo with EF 40-45% persistent wall motion abnormalities.  - Monitor clinically for fluid overload.   - Hold home coreg, lasix, and lisinopril while critically ill, restart as blood pressure allows  # Urinary  Retention - Required in-and-out catheterization for urination. Bladder scan showed  300-460mL prior to catheterization. Continues to have some urinary retention. Asymptomatic at this time. No suprapubic tenderness to exam.  - Bladder scan prn.  - Consider Coude cath if he continues to retain. In and out cath today.  - Creatinine improved to 1.75 from 2.10 - Start on flomax.   # CAD / AAA. Patient with 6-vessel CABG in 2006. AAA diagnosed this year and is currently being managed by vascular surgery as outpatient. AAA stable on CT.  - Continue home aspirin - Restart home statin when taking PO - Holding Coreg as above.   # AKI on CKD. Cr 2.27 on admission. Baseline 1.2-1.5. Likely pre-renal in setting of acute pancreatitis, poor po intake. Diarrhea may have contributed as well.  - Creatinine improving.  - Gentle IVF - Continue to mointor  # COPD. Not on any medications. Clinically stable.  - Continue to monitor  # Chronic Diarrhea. Unclear etiology. May be related to chronic pancreatitis vs. Other etiology. Unlikely to be c diff, though possible given recent antibiotic. Holding off on further significant workup for now. If it continues at discharge, then will need further workup as an outpatient.  - monitor clinically. Not contributing to current illness in a significant way at this time.  - Consider GI referral as outpatient if persists.   # TBI / Seizure disorder / PTSD - Continue home depakote - Consider checking valproate level if continues to be lethargic  # Sinusitis: recent URI symptoms, started on amoxicillin for sinus infection on 4/28 - Hold amox while NPO - Consider restarting when  tolerating PO or on discharge if indicated.  - Monitor URI symptoms  FEN/GI: NPO, D5 NS @ 100cc/hr Prophylaxis: SubQ heparin  [facility]: [facility], currently in SDU  Subjective:  Complaining of abdominal pain this am, he says it is better  with the pain medicine. Says his pain is in the "pit of his stomach". He has not vomiting or nausea. Feels about the same as previous. No fever or chills. Oriented to person and location though not to day. No suprapubic tenderness today, and says that he feels he could pee if he had enough time to stand up and go to the bathroom. Goes often at home and throughout the night usually, though he is on lasix.   Initially presented to ED with abdominal pain and altered mental status. Noted intermittent abdominal pain x3-4 months. Increasing in intensity over the last three days. Wife states he tends to become confused with minimal pain. Wife was not in the room this am, will discuss with her upon her return.   Objective: Temp:  [98.1 F (36.7 C)-100 F (37.8 C)] 98.5 F (36.9 C) (05/02 0930) Pulse Rate:  [68-79] 73 (05/02 0930) Resp:  [16-26] 18 (05/02 0930) BP: (94-117)/(55-71) 117/63 mmHg (05/02 0930) SpO2:  [95 %-100 %] 99 % (05/02 0930) Weight:  [158 lb 8.2 oz (71.9 kg)] 158 lb 8.2 oz (71.9 kg) (05/02 0432) Physical Exam: General: NAD, AAOx2,  Cardiovascular: S1 and S2 noted. No murmurs. Regular rate and rhythm Respiratory: Clear to auscultation bilaterally, no increased work of breathing Abdomen: Epigastric tenderness, No guarding, no peritoneal signs, no suprapubic tenderness, no ecchymosis.  Extremities: WWP, no edema, 2+ distal pulses.  Neuro: Oriented x 2.   Laboratory:  Recent Labs Lab 02/20/15 1419 02/20/15 1529 02/21/15 0314  WBC 21.1*  --  14.4*  HGB 12.1* 12.9* 11.0*  HCT 34.5* 38.0* 31.5*  PLT 182  --  133*    Recent Labs Lab 02/20/15 1419 02/20/15 1529 02/21/15 0314  NA 130* 130* 134*  K 4.2 6.1* 4.4  CL 93* 72* 102  CO2 24  --  20*  BUN 35* 56* 29*  CREATININE 2.27* 2.10* 1.75*  CALCIUM 8.9  --  8.3*  PROT 6.8  --   --   BILITOT 0.7  --   --   ALKPHOS 50  --   --   ALT 7  --   --   AST 15  --   --   GLUCOSE 106* 103* 94  - Troponin negative - Lipid  Panel: Total Cholesterol 79, Triglycerides 63, HDL 28, LDL 38 - Lactic Acid 1.44 - UA: normal  Imaging/Diagnostic Tests: Ct Abdomen Pelvis Wo Contrast  02/20/2015   CLINICAL DATA:  Intermittent abdominal pain for 3 days, disoriented, chills  EXAM: CT ABDOMEN AND PELVIS WITHOUT CONTRAST  TECHNIQUE: Multidetector CT imaging of the abdomen and pelvis was performed following the standard protocol without IV contrast.  COMPARISON:  01/05/2015  FINDINGS: Liver and gallbladder are normal. Moderate inflammatory change seen around the pancreas. Numerous pancreatic calcifications. 1.5-2 cm oval low-attenuation lesion in the body of the pancreas, new from prior study. Spleen and adrenal glands normal. Right kidney normal. Severe left renal atrophy stable. Stable abdominal aortic aneurysm in terms of size and appearance measuring up to 48 x 41 mm. No acute abnormalities involving smaller large bowel or stomach. Bladder is normal. Trace free fluid in the pelvis.  Heavy coronary artery calcification. Visualized portions of the  lung bases clear. No acute osseous abnormalities.  IMPRESSION: Moderate inflammatory change involving the pancreas consistent with pancreatitis, acute, superimposed on chronic. Low-attenuation lesion in the body of the pancreas may represent pancreatic abscess or pseudocyst.  Other nonacute findings described above.   Electronically Signed   By: Esperanza Heir M.D.   On: 02/20/2015 16:24   Dg Chest Port 1 View  02/20/2015   CLINICAL DATA:  Severe abdominal pain. Lethargy. COPD. CHF. Low blood pressure.  EXAM: PORTABLE CHEST - 1 VIEW  COMPARISON:  03/08/2007 and 03/30/2014  FINDINGS: Patient rotated right. Prior median sternotomy. Normal heart size. No pleural effusion or pneumothorax. Clear lungs.  IMPRESSION: No acute cardiopulmonary disease.   Electronically Signed   By: Jeronimo Greaves M.D.   On: 02/20/2015 18:25   2D Echocardiogram 5/1:  Study Conclusions  - Left ventricle: The cavity size  was normal. Wall thickness was increased in a pattern of moderate LVH. Systolic function was mildly to moderately reduced. The estimated ejection fraction was in the range of 40% to 45%. Basal to mid inferior and apical akinesis. Doppler parameters are consistent with abnormal left ventricular relaxation (grade 1 diastolic dysfunction). The E/e&' ratio is <8, suggesting normal LV filling pressure. - Mitral valve: Calcified annulus. There was trivial regurgitation. - Left atrium: The atrium was normal in size. - Atrial septum: There was increased thickness of the septum, consistent with lipomatous hypertrophy. - Inferior vena cava: The vessel was normal in size. The respirophasic diameter changes were in the normal range (>= 50%), consistent with normal central venous pressure.  Impressions:  - Compared to the prior study in 2013, the LVEF has improved to 40-45% - there are persistent inferior and apical wall motion abnormalities, probably representing scar.   Yolande Jolly, MD 02/22/2015, 9:45 AM PGY-1, [facility] FPTS Intern pager: [phone], text pages welcome

## 2015-02-23 ENCOUNTER — Inpatient Hospital Stay (HOSPITAL_COMMUNITY): Payer: Medicare Other

## 2015-02-23 DIAGNOSIS — R1013 Epigastric pain: Secondary | ICD-10-CM

## 2015-02-23 LAB — BASIC METABOLIC PANEL
Anion gap: 13 (ref 5–15)
BUN: 18 mg/dL (ref 6–20)
CHLORIDE: 107 mmol/L (ref 101–111)
CO2: 18 mmol/L — AB (ref 22–32)
Calcium: 8.7 mg/dL — ABNORMAL LOW (ref 8.9–10.3)
Creatinine, Ser: 1.25 mg/dL — ABNORMAL HIGH (ref 0.61–1.24)
GFR calc Af Amer: >60 mL/min (ref >60)
GFR calc non Af Amer: 59 mL/min — ABNORMAL LOW (ref >60)
Glucose, Bld: 106 mg/dL — ABNORMAL HIGH (ref 70–99)
Potassium: 4 mmol/L (ref 3.5–5.1)
SODIUM: 138 mmol/L (ref 135–145)

## 2015-02-23 LAB — CBC
HCT: 32.8 % — ABNORMAL LOW (ref 39.0–52.0)
Hemoglobin: 11.6 g/dL — ABNORMAL LOW (ref 13.0–17.0)
MCH: 34.7 pg — ABNORMAL HIGH (ref 26.0–34.0)
MCHC: 35.4 g/dL (ref 30.0–36.0)
MCV: 98.2 fL (ref 78.0–100.0)
Platelets: 115 10*3/uL — ABNORMAL LOW (ref 150–400)
RBC: 3.34 MIL/uL — ABNORMAL LOW (ref 4.22–5.81)
RDW: 12.4 % (ref 11.5–15.5)
WBC: 18.7 10*3/uL — ABNORMAL HIGH (ref 4.0–10.5)

## 2015-02-23 LAB — TROPONIN I: TROPONIN I: 0.49 ng/mL — AB (ref <0.031)

## 2015-02-23 LAB — BRAIN NATRIURETIC PEPTIDE

## 2015-02-23 MED ORDER — FUROSEMIDE 10 MG/ML IJ SOLN
40.0000 mg | Freq: Once | INTRAMUSCULAR | Status: AC
  Administered 2015-02-23: 40 mg via INTRAVENOUS
  Filled 2015-02-23: qty 4

## 2015-02-23 MED ORDER — FUROSEMIDE 10 MG/ML IJ SOLN
40.0000 mg | Freq: Every day | INTRAMUSCULAR | Status: DC
Start: 2015-02-24 — End: 2015-02-24
  Filled 2015-02-23: qty 4

## 2015-02-23 NOTE — Progress Notes (Signed)
S: called to room due to pt's wife being there. She is concerned that he has just developed shortness of breath and some anxiety. He was just placed on 2L [place] due to having pulse ox checked and having O2 of 88%. He is not having chest pain at this time, but does say that he feels SOB.   O:  Filed Vitals:   02/23/15 1629  BP: 121/79  Pulse: 84  Temp: 97.9 F (36.6 C)  Resp:    Gen: Sitting up in bed, somewhat anxious CV: RRR, No MGR, no TTP Resp: Crackles in bilateral bases and left mid lung field, tachypneic, good air movement.  Abd: Nontender, S, +BS   A/P: Pt. With significant cardiac history and known CHF with reduced EF. Has been getting IVF around 100cc/hr. But has not gotten them since mid morning due to losing his IV. No chest pain, though patient with difficulty expressing himself due to TBI and Anxiety.  - Stat CXR - BNP - Troponin - EKG - Lasix 40mg  IV.

## 2015-02-23 NOTE — Progress Notes (Signed)
Patient ID: [name], male   DOB: [birth year], 64 y.o.   MRN: [id number]     [facility]      [address] [place], [place] [address]    Phone: [phone] FAX: [phone]     Subjective: Poor historian.  Denies nausea.  Per nursing didn't consume much of clears.  Points to lower abdomen when asked if he's in pain.  Afebrile.  WBC down to 12.1k.    Objective:  Vital signs:  Filed Vitals:   02/22/15 1223 02/22/15 1600 02/22/15 2200 02/23/15 0515  BP: 110/67 127/68 131/71 122/70  Pulse: 63 73 87 79  Temp: 97.7 F (36.5 C)  98.6 F (37 C) 98.7 F (37.1 C)  TempSrc: Oral  Oral Oral  Resp: 16 20 20 19   Height:   6' 1"  (1.854 m)   Weight:   72.04 kg (158 lb 13.1 oz)   SpO2: 99% 99% 92% 90%       Intake/Output   Yesterday:  05/02 0701 - 05/03 0700 In: 800 [I.V.:800] Out: 700 [Urine:700] This shift:    I/O last 3 completed shifts: In: 2200 [I.V.:2100; IV Piggyback:100] Out: 700 [Urine:700]    Physical Exam: General: Pt lethargic, oriented to person, place and in no acute distress  Abdomen: Soft.  Nondistended.  Non tender.  No evidence of peritonitis.  No incarcerated hernias.    Problem List:   Active Problems:   Acute pancreatitis   Acute renal failure syndrome   Hyponatremia   Post traumatic stress disorder   Traumatic brain injury   Chronic systolic congestive heart failure   Leukocytosis    Results:   Labs: Results for orders placed or performed during the hospital encounter of 02/20/15 (from the past 48 hour(s))  Ethanol     Status: None   Collection Time: 02/21/15  2:25 PM  Result Value Ref Range   Alcohol, Ethyl (B) <5 <5 mg/dL    Comment:        LOWEST DETECTABLE LIMIT FOR SERUM ALCOHOL IS 11 mg/dL FOR MEDICAL PURPOSES ONLY   Comprehensive metabolic panel     Status: Abnormal   Collection Time: 02/22/15 11:12 AM  Result Value Ref Range   Sodium 139 135 - 145 mmol/L   Potassium 3.7  3.5 - 5.1 mmol/L   Chloride 109 101 - 111 mmol/L   CO2 22 22 - 32 mmol/L   Glucose, Bld 81 70 - 99 mg/dL   BUN 19 6 - 20 mg/dL   Creatinine, Ser 1.42 (H) 0.61 - 1.24 mg/dL   Calcium 8.6 (L) 8.9 - 10.3 mg/dL   Total Protein 5.1 (L) 6.5 - 8.1 g/dL   Albumin 1.8 (L) 3.5 - 5.0 g/dL   AST 15 15 - 41 U/L   ALT 8 (L) 17 - 63 U/L   Alkaline Phosphatase 41 38 - 126 U/L   Total Bilirubin 0.5 0.3 - 1.2 mg/dL   GFR calc non Af Amer 50 (L) >60 mL/min   GFR calc Af Amer 58 (L) >60 mL/min    Comment: (NOTE) The eGFR has been calculated using the CKD EPI equation. This calculation has not been validated in all clinical situations. eGFR's persistently <90 mL/min signify possible Chronic Kidney Disease.    Anion gap 8 5 - 15  CBC     Status: Abnormal   Collection Time: 02/22/15 11:12 AM  Result Value Ref Range   WBC 12.1 (H) 4.0 -  10.5 K/uL   RBC 2.82 (L) 4.22 - 5.81 MIL/uL   Hemoglobin 9.8 (L) 13.0 - 17.0 g/dL   HCT 27.9 (L) 39.0 - 52.0 %   MCV 98.9 78.0 - 100.0 fL   MCH 34.8 (H) 26.0 - 34.0 pg   MCHC 35.1 30.0 - 36.0 g/dL   RDW 12.4 11.5 - 15.5 %   Platelets 107 (L) 150 - 400 K/uL    Comment: REPEATED TO VERIFY SPECIMEN CHECKED FOR CLOTS PLATELET COUNT CONFIRMED BY SMEAR     Imaging / Studies: No results found.  Medications / Allergies:  Scheduled Meds: . aspirin  325 mg Oral Daily  . divalproex  500 mg Oral TID  . heparin  5,000 Units Subcutaneous 3 times per day  . sodium chloride  3 mL Intravenous Q12H  . tamsulosin  0.4 mg Oral Daily   Continuous Infusions: . dextrose 5 % and 0.9% NaCl Stopped (02/23/15 1020)   PRN Meds:.acetaminophen **OR** acetaminophen, morphine injection  Antibiotics: Anti-infectives    Start     Dose/Rate Route Frequency Ordered Stop   02/20/15 1700  vancomycin (VANCOCIN) IVPB 1000 mg/200 mL premix  Status:  Discontinued     1,000 mg 200 mL/hr over 60 Minutes Intravenous Every 24 hours 02/20/15 1640 02/20/15 1835   02/20/15 1645   piperacillin-tazobactam (ZOSYN) IVPB 3.375 g  Status:  Discontinued     3.375 g 12.5 mL/hr over 240 Minutes Intravenous 3 times per day 02/20/15 1640 02/22/15 1114        Assessment/Plan Acute on chronic pancreatitis- CT of abdomen and pelvis 4/30 cyst v abscess.  WBC is down, afebrile, non tender on exam.  May advance diet as tolerated. If the patient worsens or develops fevers, increasing WBC, recommend a repeat CT scan.  No surgical indications at this time.    Will sign off.  Please call [facility] with any further assistance.  TBI/seizure disorder-lethargic, medicine working up.    Unclear what his baseline is  AAA-CT 1 month ago d/t abd pain.  4.8cm in size.  Smoker. Followed by Dr. Rennie Plowman, [facility] Surgery Pager [phone]([id number]) For consults and floor pages call [phone]([id number]:30P)  02/23/2015 10:54 AM  Agree with above. Abdomen entirely benign at this time.  Alphonsa Overall, MD, [facility] Surgery Pager: [phone] Office phone:  [phone]

## 2015-02-23 NOTE — Clinical Documentation Improvement (Signed)
MD's, NP's, and PA's  Noted documentation of "AKI on CKD"  if possible please specify stage of "CKD"  .  Thank you    Possible Clinical Conditions?   CKD Stage I - GFR > OR = 90 CKD Stage II - GFR 60-80 CKD Stage III - GFR 30-59 CKD Stage IV - GFR 15-29 CKD Stage V - GFR < 15 Other condition Cannot Clinically determine       Risk Factors:  Chronic Systolic CHF, AKF, Urinary retention   Diagnostics: GFR Non AF Amer  50/ 39/ 29         creatnine levels starting on 4/30 to current 5/2 (1.42) 1.42 (H) 1.75 (H) 2.10 (H)R 2.27 (H)R          Treatment: Bolus'd with NS 500 ml and 1000ml IV on admit / D5 NS @ 100ml /hr via IV  Thank You, [name] ,RN Clinical Documentation Specialist:  [phone]  [facility]- Health Information Management

## 2015-02-23 NOTE — Progress Notes (Signed)
Patient new O2 requirement and dyspnea this afternoon (~ 430 PM). BNP, Chest xray and Troponin obtained. BNP >4500, Troponin 0.49, and Chest xray with Patchy airspace consolidation in both lower lobes concerning for PNA vs CHF. EKG unchanged.  Given troponin elevation, this was discuss with Cardiology (Dr. Jearld PiesMcClean).  He (and myself) felt this was secondary to CHF especially in the setting of fluid administration due to pancreatitis and known CHF.  He advised diuresis.  No indication for cath at this time given absence of chest pain.  Plan: Will diurese and follow up tomorrow. Will reconsult cardiology if he fails to improve.

## 2015-02-23 NOTE — Progress Notes (Signed)
Family Medicine Teaching Service Daily Progress Note Intern Pager: [phone]  Patient name: [name] Medical record number: [id number] Date of birth: 08-26-1951 Age: 64 y.o. Gender: male  Primary Care Provider: Everlene Otherook, Jayce, DO Consultants: Surgery Code Status: Full  Assessment and Plan: 64 y.o. male presenting with abdominal pain found to have acute on chronic pancreatitis. PMH is significant for HFrEF, CAD s/p CABG, COPD, TBI, seizures, PTSD.  # Acute on Chronic Pancreatitis. qSOFA 2 at admission, BISAP score of 3 on admission. Elevated lipase 157, Lactic acid initially elevated but trended down to 1.44.  Unclear cause of chronic pancreatitis - patient has never been diagnosed with this prior. History unrevealing, UDS negative.  Normal LFTS and alkaline phosphatase make gallstone etiology less likely. Possibly related to remote intra-abdominal injury. ? Ischemic pancreatitis. Also has had chronic diarrhea x several months etiology vs. Symptom. Clinically stable at this time, afebrile, improving, tolerating current diet.  - Blood and urine cultures NGTD - Electrolytes stable.  - Zosyn discontinued.   - Morphine prn for pain control.  - Tylenol PRN fever > monitoring fever curve.  - Consider re-CT if clinically worsening.  - Will advance diet again today.   - Continue IVF D5 NS at 100cc/hr until able to take regular diet.  - Surgery consulted > appreciate recs. Follow clinically for now.   # HFrEF. Echo in 2013 with EF of 30 to 35%. Has refused ICD placement in the past. Currently euvolemic - Repeat Echo with EF 40-45% persistent wall motion abnormalities.  - Monitor clinically for fluid overload. > none at this time.   - Hold home coreg, lasix, and lisinopril while critically ill, restart as blood pressure allows  # Urinary Retention - Required in-and-out catheterization for urination. Bladder scan with some continued retention. Foley placed.  - Foley catheter placed.  - Flomax  started.   # CAD / AAA. Patient with 6-vessel CABG in 2006. AAA diagnosed this year and is currently being managed by vascular surgery as outpatient. AAA stable on CT.  - Continue home aspirin - Restart home statin when taking PO - Holding Coreg as above.   # AKI on CKD III. Cr 2.27 on admission. Baseline 1.2-1.5. Likely pre-renal in setting of acute pancreatitis, poor po intake. Diarrhea may have contributed as well.  - Creatinine improving.  - Gentle IVF - Continue to mointor  # COPD. Not on any medications. Clinically stable.  - Continue to monitor  # Chronic Diarrhea. Unclear etiology. May be related to chronic pancreatitis vs. Other etiology. Unlikely to be c diff, though possible given recent antibiotic. Holding off on further significant workup for now. If it continues at discharge, then will need further workup as an outpatient.  - No diarrhea at this time.  - Consider GI referral as outpatient if persists.   # TBI / Seizure disorder / PTSD - Continue home depakote - Consider checking valproate level if continues to be lethargic  # Sinusitis: recent URI symptoms, started on amoxicillin for sinus infection on 4/28 - Hold amox while NPO - Consider restarting when tolerating PO or on discharge if indicated.  - Monitor URI symptoms  FEN/GI: NPO, D5 NS @ 100cc/hr Prophylaxis: SubQ heparin  Disposition: Slowly improving. Continuing to follow clinically.   Subjective:  Improving somewhat since yesterday. Has tolerated his diet without nausea or vomiting. No nausea this am. Would like something more to drink. Says that his pain is about the same. He is oriented to person and  place. He says he will ask for pain medicine if his pain is bad enough.   Objective: Temp:  [97.7 F (36.5 C)-98.7 F (37.1 C)] 98.7 F (37.1 C) (05/03 0515) Pulse Rate:  [63-87] 79 (05/03 0515) Resp:  [16-20] 19 (05/03 0515) BP: (110-131)/(63-71) 122/70 mmHg (05/03 0515) SpO2:  [90 %-99 %] 90 % (05/03  0515) Weight:  [158 lb 13.1 oz (72.04 kg)] 158 lb 13.1 oz (72.04 kg) (05/02 2200) Physical Exam: General: NAD, AAOx2,  Cardiovascular: S1 and S2 noted. No murmurs. Regular rate and rhythm Respiratory: Clear to auscultation bilaterally, no increased work of breathing Abdomen: Epigastric tenderness improving, no peritoneal signs, no guarding, +BS this am. No distension.  Extremities: WWP, no edema, 2+ distal pulses.  Neuro: Oriented x 2.   Laboratory:  Recent Labs Lab 02/20/15 1419 02/20/15 1529 02/21/15 0314 02/22/15 1112  WBC 21.1*  --  14.4* 12.1*  HGB 12.1* 12.9* 11.0* 9.8*  HCT 34.5* 38.0* 31.5* 27.9*  PLT 182  --  133* 107*    Recent Labs Lab 02/20/15 1419 02/20/15 1529 02/21/15 0314 02/22/15 1112  NA 130* 130* 134* 139  K 4.2 6.1* 4.4 3.7  CL 93* 72* 102 109  CO2 24  --  20* 22  BUN 35* 56* 29* 19  CREATININE 2.27* 2.10* 1.75* 1.42*  CALCIUM 8.9  --  8.3* 8.6*  PROT 6.8  --   --  5.1*  BILITOT 0.7  --   --  0.5  ALKPHOS 50  --   --  41  ALT 7  --   --  8*  AST 15  --   --  15  GLUCOSE 106* 103* 94 81  - Troponin negative - Lipid Panel: Total Cholesterol 79, Triglycerides 63, HDL 28, LDL 38 - Lactic Acid 1.44 - UA: normal  Imaging/Diagnostic Tests: No results found. 2D Echocardiogram 5/1:  Study Conclusions  - Left ventricle: The cavity size was normal. Wall thickness was increased in a pattern of moderate LVH. Systolic function was mildly to moderately reduced. The estimated ejection fraction was in the range of 40% to 45%. Basal to mid inferior and apical akinesis. Doppler parameters are consistent with abnormal left ventricular relaxation (grade 1 diastolic dysfunction). The E/e&' ratio is <8, suggesting normal LV filling pressure. - Mitral valve: Calcified annulus. There was trivial regurgitation. - Left atrium: The atrium was normal in size. - Atrial septum: There was increased thickness of the septum, consistent with lipomatous  hypertrophy. - Inferior vena cava: The vessel was normal in size. The respirophasic diameter changes were in the normal range (>= 50%), consistent with normal central venous pressure.  Impressions:  - Compared to the prior study in 2013, the LVEF has improved to 40-45% - there are persistent inferior and apical wall motion abnormalities, probably representing scar.   Yolande Jolly, MD 02/23/2015, 8:59 AM PGY-1, [place] [facility] FPTS Intern pager: [phone], text pages welcome

## 2015-02-24 ENCOUNTER — Encounter: Payer: Self-pay | Admitting: Cardiology

## 2015-02-24 DIAGNOSIS — E43 Unspecified severe protein-calorie malnutrition: Secondary | ICD-10-CM

## 2015-02-24 LAB — BASIC METABOLIC PANEL
Anion gap: 12 (ref 5–15)
BUN: 22 mg/dL — ABNORMAL HIGH (ref 6–20)
CO2: 21 mmol/L — AB (ref 22–32)
Calcium: 8.6 mg/dL — ABNORMAL LOW (ref 8.9–10.3)
Chloride: 105 mmol/L (ref 101–111)
Creatinine, Ser: 1.26 mg/dL — ABNORMAL HIGH (ref 0.61–1.24)
GFR calc Af Amer: >60 mL/min (ref >60)
GFR, EST NON AFRICAN AMERICAN: 58 mL/min — AB (ref >60)
GLUCOSE: 94 mg/dL (ref 70–99)
Potassium: 3.4 mmol/L — ABNORMAL LOW (ref 3.5–5.1)
Sodium: 138 mmol/L (ref 135–145)

## 2015-02-24 LAB — TROPONIN I
Troponin I: 0.47 ng/mL — ABNORMAL HIGH (ref <0.031)
Troponin I: 0.34 ng/mL — ABNORMAL HIGH (ref <0.031)
Troponin I: 0.25 ng/mL — ABNORMAL HIGH (ref <0.031)

## 2015-02-24 LAB — CBC
HCT: 33.5 % — ABNORMAL LOW (ref 39.0–52.0)
Hemoglobin: 11.7 g/dL — ABNORMAL LOW (ref 13.0–17.0)
MCH: 34 pg (ref 26.0–34.0)
MCHC: 34.9 g/dL (ref 30.0–36.0)
MCV: 97.4 fL (ref 78.0–100.0)
Platelets: 109 10*3/uL — ABNORMAL LOW (ref 150–400)
RBC: 3.44 MIL/uL — AB (ref 4.22–5.81)
RDW: 12.5 % (ref 11.5–15.5)
WBC: 13.6 10*3/uL — AB (ref 4.0–10.5)

## 2015-02-24 MED ORDER — OXYCODONE HCL 5 MG PO TABS: 5.0000 mg | ORAL_TABLET | Freq: Four times a day (QID) | ORAL | Status: DC | PRN

## 2015-02-24 MED ORDER — FUROSEMIDE 40 MG PO TABS
40.0000 mg | ORAL_TABLET | ORAL | Status: DC
  Administered 2015-02-25: 40 mg via ORAL
  Filled 2015-02-24: qty 1

## 2015-02-24 MED ORDER — ENSURE ENLIVE PO LIQD
237.0000 mL | Freq: Three times a day (TID) | ORAL | Status: DC
  Administered 2015-02-24 – 2015-02-26 (×5): 237 mL via ORAL

## 2015-02-24 MED ORDER — POTASSIUM CHLORIDE 10 MEQ/100ML IV SOLN
10.0000 meq | INTRAVENOUS | Status: AC
Start: 2015-02-24 — End: 2015-02-24
  Administered 2015-02-24 (×2): 10 meq via INTRAVENOUS
  Filled 2015-02-24 (×2): qty 100

## 2015-02-24 MED ORDER — FUROSEMIDE 10 MG/ML IJ SOLN
40.0000 mg | Freq: Every day | INTRAMUSCULAR | Status: DC
Start: 2015-02-25 — End: 2015-02-24

## 2015-02-24 MED ORDER — FUROSEMIDE 10 MG/ML IJ SOLN
40.0000 mg | Freq: Once | INTRAMUSCULAR | Status: AC
  Administered 2015-02-24: 40 mg via INTRAVENOUS

## 2015-02-24 MED ORDER — FUROSEMIDE 40 MG PO TABS: 40.0000 mg | ORAL_TABLET | ORAL | Status: DC

## 2015-02-24 NOTE — Progress Notes (Signed)
Family Medicine Teaching Service Daily Progress Note Intern Pager: [phone]  Patient name: [name] Medical record number: [id number] Date of birth: 10-Jan-1951 Age: 64 y.o. Gender: male  Primary Care Provider: Everlene Other, DO Consultants: Surgery Code Status: Full  Assessment and Plan: 64 y.o. male presenting with abdominal pain found to have acute on chronic pancreatitis. PMH is significant for HFrEF, CAD s/p CABG, COPD, TBI, seizures, PTSD.  # Acute on Chronic Pancreatitis. qSOFA 2 at admission, BISAP score of 3 on admission. Elevated lipase 157, Lactic acid initially elevated but trended down to 1.44.  Unclear cause of chronic pancreatitis - patient has never been diagnosed with this prior. History unrevealing, UDS negative.  Normal LFTS and alkaline phosphatase make gallstone etiology less likely. Possibly related to remote intra-abdominal injury. ? Ischemic pancreatitis. Also has had chronic diarrhea x several months etiology vs. Symptom. Clinically stable at this time. He remains afebrile. Mental status waxes and wanes. Vital signs appropriate. Tolerating full liquids, though he is not eating much.  - Blood and urine cultures NG x 5 days.  - Slightly hypokalemic likely from diuresis > repleting - Zosyn discontinued, but low threshold to restart.  - Morphine switched to po oxycodone for pain control.  - Tylenol PRN fever > monitoring fever curve.  - May consider repeat CT scan if he is worsening.  - Tolerating diet > advance again to soft diet.  - Holding IVF for now given CHF exacerbation yesterday.   - Surgery has signed off.   # Acute on Chronic CHF Exacerbation, HFrEF. Echo in 2013 with EF of 30 to 35%. Has refused ICD placement in the past. Currently euvolemic. Recent Echo with improved EF. SOB with mild O2 requirement developed yesterday. CXR with congestion in the bilateral bases as well as effusion on the left. BNP > 4500. Crackles to exam. Weight is up 8 lbs since  admission, and fluid status is positive.  - Repeat Echo 5/1 with EF 40-45% persistent wall motion abnormalities.  - Lasix IV 40mg  given yesterday with good diuresis > 1200cc out overnight. Repeat lasix dose this am.  - Improving respiratory status with diuresis.  - Strict I/O and daily weights.  - Wean O2 today as tolerated.  - Holding IVF for now, though he is not taking much PO so will need to be careful not to over-diurese.  - Will restart home lasix - Continue to hold home coreg, and  lisinopril for now.  - PT today.   # Troponin Elevation  - Pt. With mild troponin elevation yesterday in the context of CHF exacerbation and CKD III. No chest pain, and EKG without acute changes. Low likelihood of ACS, however, called Cardiology on call to discuss with them. They felt that this was most likely CHF with troponemia due to demand ischemia / CHF exacerbation. Has continued without chest pain at this time and is stable. Known cardiac history as documented below.  - Continue to monitor clinically.  - Troponins trending down 0.49 > 0.47 > 0.34 - Reconsult cardiology if indicated.   # Urinary Retention - Required in-and-out catheterization for urination. Bladder scan with some retention on 5/2. Foley placed.  - Foley catheter in place.  - Flomax started.  - Will remove Foley tomorrow   # Severe Protein Calorie Malnutrition: Pt. With albumin of 1.8 in the setting of chronic pancreatitis, poor po intake, and chronic diarrhea. May be related to malabsorption 2/2 pancreatic insufficiency, however, may also be related to acute illness  as well.  - Nutrition consultation.  - continuing to advance feeds as he is able to tolerate given pancreatitis.  - Will need significant nutritional support for adequate nutritional recovery long term.   - May need Creon if this is related to malabsorption 2/2 pancreatic insufficiency.   # CAD / AAA. Patient with 6-vessel CABG in 2006. AAA diagnosed this year and is  currently being managed by vascular surgery as outpatient. AAA stable on CT.  - Continue home aspirin - Restart home statin when taking PO - Holding Coreg as above.   # AKI on CKD III.  Resolved. Cr 2.27 on admission. Baseline 1.2-1.5. Pre-renal in setting of acute pancreatitis, poor po intake. Diarrhea may have contributed as well.  - Creatinine improved to baseline. 1.26.  - Holding IVF for now.  - Continue to mointor  # COPD. Not on any medications. Clinically stable.  - Continue to monitor  # Chronic Diarrhea. Unclear etiology. May be related to chronic pancreatitis vs. Other etiology. Unlikely to be c diff, though possible given recent antibiotic. Holding off on further significant workup for now. If it continues at discharge, then will need further workup as an outpatient.  - No diarrhea at this time.  - Consider GI referral as outpatient if persists.   # TBI / Seizure disorder / PTSD - Continue home depakote - Consider checking valproate level if continues to be lethargic  # Sinusitis: recent URI symptoms, started on amoxicillin for sinus infection on 4/28 - Hold amox  - Monitor URI symptoms  FEN/GI: Soft diet, MIVF stopped.  Prophylaxis: SubQ heparin  Disposition: Slowly improving. Continuing to follow clinically.   Subjective:  SOB is improving from yesterday. He continues without chest pain. He says he is thirsty and that he would like some breakfast this morning. He says that he would like something more to eat as well than just liquids. He says he still has some abdominal pain. He does appear more sleepy than yesterday, and will close his eyes and lay back after briefly talking with me. However, he is easy to arouse. He is able to sit up in bed to let me examine him without difficulty and without apparent pain.    Objective: Temp:  [97.9 F (36.6 C)-98.3 F (36.8 C)] 98.3 F (36.8 C) (05/04 0600) Pulse Rate:  [82-89] 85 (05/04 0600) Resp:  [17-18] 17 (05/04  0600) BP: (110-134)/(69-79) 126/73 mmHg (05/04 0600) SpO2:  [95 %-98 %] 98 % (05/04 0600) Weight:  [160 lb 11.5 oz (72.9 kg)] 160 lb 11.5 oz (72.9 kg) (05/03 2053) Physical Exam: General: NAD, AAOx2  Cardiovascular: S1 and S2 noted. No murmurs. Regular rate and rhythm Respiratory: Crackles at bases improving from yesterday afternoon, Appropriate rate of breathing, [place] in place, no rales, good air movement.  Abdomen: Epigastric tenderness improving to my exam, no peritoneal signs, no guarding, +BS this am. No distension. However, he does have some suprapubic tenderness this am.  Extremities: WWP, no edema to exam, 2+ distal pulses.  Neuro: Oriented x 2.   Laboratory:  Recent Labs Lab 02/22/15 1112 02/23/15 1100 02/24/15 0538  WBC 12.1* 18.7* 13.6*  HGB 9.8* 11.6* 11.7*  HCT 27.9* 32.8* 33.5*  PLT 107* 115* 109*    Recent Labs Lab 02/20/15 1419  02/22/15 1112 02/23/15 1100 02/24/15 0538  NA 130*  < > 139 138 138  K 4.2  < > 3.7 4.0 3.4*  CL 93*  < > 109 107 105  CO2 24  < >  22 18* 21*  BUN 35*  < > 19 18 22*  CREATININE 2.27*  < > 1.42* 1.25* 1.26*  CALCIUM 8.9  < > 8.6* 8.7* 8.6*  PROT 6.8  --  5.1*  --   --   BILITOT 0.7  --  0.5  --   --   ALKPHOS 50  --  41  --   --   ALT 7  --  8*  --   --   AST 15  --  15  --   --   GLUCOSE 106*  < > 81 106* 94  < > = values in this interval not displayed.- Troponin negative - Lipid Panel: Total Cholesterol 79, Triglycerides 63, HDL 28, LDL 38 - Lactic Acid 1.44 - UA: normal - BNP > 4500 - Troponin 0.49 > 0.47 > 0.34  Imaging/Diagnostic Tests: Dg Chest Port 1 View  02/23/2015   CLINICAL DATA:  One day history of shortness of breath  EXAM: PORTABLE CHEST - 1 VIEW  COMPARISON:  February 20, 2015  FINDINGS: There is patchy airspace consolidation in both lower lobes. Elsewhere lungs are clear. Heart is upper normal in size with pulmonary vascularity within normal limits. Patient is status post coronary artery bypass grafting. No  adenopathy. No bone lesions.  IMPRESSION: Patchy airspace consolidation in both lower lobes. Suspect pneumonia, although atypical appearance of congestive heart failure may present in this manner. Both entities may exist concurrently.   Electronically Signed   By: Bretta Bang III M.D.   On: 02/23/2015 17:46   2D Echocardiogram 5/1:  Study Conclusions  - Left ventricle: The cavity size was normal. Wall thickness was increased in a pattern of moderate LVH. Systolic function was mildly to moderately reduced. The estimated ejection fraction was in the range of 40% to 45%. Basal to mid inferior and apical akinesis. Doppler parameters are consistent with abnormal left ventricular relaxation (grade 1 diastolic dysfunction). The E/e&' ratio is <8, suggesting normal LV filling pressure. - Mitral valve: Calcified annulus. There was trivial regurgitation. - Left atrium: The atrium was normal in size. - Atrial septum: There was increased thickness of the septum, consistent with lipomatous hypertrophy. - Inferior vena cava: The vessel was normal in size. The respirophasic diameter changes were in the normal range (>= 50%), consistent with normal central venous pressure.  Impressions:  - Compared to the prior study in 2013, the LVEF has improved to 40-45% - there are persistent inferior and apical wall motion abnormalities, probably representing scar.   Yolande Jolly, MD 02/24/2015, 9:06 AM PGY-1, [facility] FPTS Intern pager: [phone], text pages welcome

## 2015-02-24 NOTE — Progress Notes (Signed)
Initial Nutrition Assessment   Pt meets criteria for SEVERE MALNUTRITION in the context of chronic illness as evidenced by severe fat and muscle mass loss.  DOCUMENTATION CODES:  Severe malnutrition in context of chronic illness   INTERVENTION:  Ensure Enlive (each supplement provides 350kcal and 20 grams of protein) (TID between meals)  NUTRITION DIAGNOSIS:  Malnutrition related to chronic illness as evidenced by severe depletion of body fat, severe depletion of muscle mass.   GOAL:  Patient will meet greater than or equal to 90% of their needs   MONITOR:  PO intake, Weight trends, Labs, I & O's  REASON FOR ASSESSMENT:  Consult Assessment of nutrition requirement/status  ASSESSMENT: Pt with abdominal pain found to have acute on chronic pancreatitis. PMH is significant for HFrEF, CAD s/p CABG, COPD, TBI, seizures, PTSD.   Pt is currently on a soft diet. Meal completion is 20-60%. Pt reports abdominal pain and decreased appetite. PTA he reports not eating well, however is unable to quantify po intake at home. Noted pt was a bit confused during time of visit. No family at bedside. Weight has been stable. NFPE performed which revealed severe fat and muscle mass loss. RD to order Ensure to aid in caloric and protein needs. Pt was encouraged to eat his food at meals.   Labs: Low potassium, CO2, calcium, and GFR. High BUN and creatinine.  Height:  Ht Readings from Last 1 Encounters:  02/23/15 6\' 1"  (1.854 m)    Weight:  Wt Readings from Last 1 Encounters:  02/23/15 160 lb 11.5 oz (72.9 kg)    Ideal Body Weight:  83.64 kg  Wt Readings from Last 10 Encounters:  02/23/15 160 lb 11.5 oz (72.9 kg)  02/17/15 149 lb (67.586 kg)  01/06/15 152 lb 1.6 oz (68.992 kg)  01/01/15 153 lb 12.8 oz (69.763 kg)  12/03/14 154 lb 4.8 oz (69.99 kg)  11/10/14 155 lb (70.308 kg)  04/10/14 148 lb (67.132 kg)  03/30/14 154 lb (69.854 kg)  02/19/14 157 lb 6.4 oz (71.396 kg)  02/13/13  170 lb 12.8 oz (77.474 kg)    BMI:  Body mass index is 21.21 kg/(m^2).  Estimated Nutritional Needs:  Kcal:  2100-2300  Protein:  110-125 grams  Fluid:  2.1 - 2.3 L/day  Skin:  Reviewed, no issues  Diet Order:  DIET SOFT Room service appropriate?: Yes; Fluid consistency:: Thin  EDUCATION NEEDS:  Education needs no appropriate at this time   Intake/Output Summary (Last 24 hours) at 02/24/15 1210 Last data filed at 02/24/15 0900  Gross per 24 hour  Intake    480 ml  Output   1725 ml  Net  -1245 ml    Last BM:  PTA  Marijean NiemannStephanie La, MS, RD, LDN Pager # [phone] After hours/ weekend pager # [phone]

## 2015-02-24 NOTE — Progress Notes (Signed)
Discussed tele rhythm change with Dr. Alvy BimlerPelphs.

## 2015-02-25 LAB — BASIC METABOLIC PANEL
Anion gap: 10 (ref 5–15)
BUN: 36 mg/dL — AB (ref 6–20)
CHLORIDE: 102 mmol/L (ref 101–111)
CO2: 22 mmol/L (ref 22–32)
Calcium: 8.5 mg/dL — ABNORMAL LOW (ref 8.9–10.3)
Creatinine, Ser: 1.36 mg/dL — ABNORMAL HIGH (ref 0.61–1.24)
GFR, EST NON AFRICAN AMERICAN: 53 mL/min — AB (ref >60)
Glucose, Bld: 95 mg/dL (ref 70–99)
Potassium: 3.5 mmol/L (ref 3.5–5.1)
Sodium: 134 mmol/L — ABNORMAL LOW (ref 135–145)

## 2015-02-25 LAB — CBC
HEMATOCRIT: 31.7 % — AB (ref 39.0–52.0)
Hemoglobin: 11.2 g/dL — ABNORMAL LOW (ref 13.0–17.0)
MCH: 34.1 pg — ABNORMAL HIGH (ref 26.0–34.0)
MCHC: 35.3 g/dL (ref 30.0–36.0)
MCV: 96.6 fL (ref 78.0–100.0)
Platelets: 113 10*3/uL — ABNORMAL LOW (ref 150–400)
RBC: 3.28 MIL/uL — ABNORMAL LOW (ref 4.22–5.81)
RDW: 12.4 % (ref 11.5–15.5)
WBC: 14.6 10*3/uL — ABNORMAL HIGH (ref 4.0–10.5)

## 2015-02-25 NOTE — Progress Notes (Signed)
Family Medicine Teaching Service Daily Progress Note Intern Pager: [phone]  Patient name: [name] Medical record number: [id number] Date of birth: 31-Aug-1951 Age: 64 y.o. Gender: male  Primary Care Provider: Everlene Other, DO Consultants: Surgery Code Status: Full  Assessment and Plan: 64 y.o. male presenting with abdominal pain found to have acute on chronic pancreatitis. PMH is significant for HFrEF, CAD s/p CABG, COPD, TBI, seizures, PTSD.  # Acute on Chronic Pancreatitis. qSOFA 2 at admission, BISAP score of 3 on admission. Unclear cause of chronic pancreatitis - patient has never been diagnosed with this prior. History unrevealing, UDS negative.  Normal LFTS and alkaline phosphatase. Possibly related to remote intra-abdominal injury. ? Ischemic pancreatitis. Also has had chronic diarrhea x several months etiology vs. Symptom. Clinically stable at this time. He remains afebrile. Mental status improved and clinically improved today. Vital signs appropriate. Tolerating soft diet though he is not taking in much. Asking for liquids this morning.  - Blood and urine cultures NG x 5 days.  - s/p Zosyn given fluid collection on CT. Discontinued 5/2 - oxycodone prn for pain. Not requiring any.    - Consider repeat CT scan if he begins to worsen.  - may advance to full diet today plus ensure if tolerating.  - Holding IVF for now given CHF exacerbation yesterday.   - Surgery has signed off.   # Acute on Chronic CHF Exacerbation, HFrEF. Echo 5/1 with EF 40-45% up from 30-35% in the past. Has refused ICD placement in the past. Was fluid overloaded, now improving. SOB improved. O2 for comfort at this point. CXR with congestion in the bilateral bases as well as effusion on the left. BNP > 4500. Crackles improving. Weight is up 8-10 lbs since admission, and fluid status is positive. Laying flat in bed today without orthopnea.  - 40mg  IV lasix yesterday am switched to home po 40mg  today.   - I/O  with 1400cc's out in 24 hrs. Net positive 1L. Weight remains elevated.   - Strict I/O and daily weights.  - Wean off O2 today.  - Holding IVF for now, though he is not taking much PO so will need to be careful not to over-diurese.  - Continue to hold home coreg, and  lisinopril for now given BP's.  - PT today.   # Troponin Elevation  - Pt. With mild troponin elevation in the context of CHF exacerbation and CKD III. No chest pain, and EKG without acute changes. Low likelihood of ACS, however, called Cardiology on call to discuss with them. They felt that this was most likely CHF with troponemia due to demand ischemia / CHF exacerbation. Clinically improving with diuresis. Troponin trend is improving.  - Continue to monitor clinically.  - Troponins trended down 0.49 > 0.47 > 0.34 > 0.25 - Reconsult cardiology if indicated.   # Urinary Retention - Required in-and-out catheterization for urination. Bladder scan with some retention on 5/2. Foley placed.  - Foley catheter in place.  - Flomax has been started.  - Will remove Foley today.   # Severe Protein Calorie Malnutrition: Pt. With albumin of 1.8 in the setting of chronic pancreatitis, poor po intake, and chronic diarrhea. May be related to malabsorption 2/2 pancreatic insufficiency, however, may also be related to acute illness as well.  - Nutrition consultation > give Ensure.  - continuing to advance feeds as he is able to tolerate given pancreatitis.  - Will need significant nutritional support for adequate nutritional recovery  long term. - May need Creon if this is related to malabsorption 2/2 pancreatic insufficiency.   # CAD / AAA. Patient with 6-vessel CABG in 2006. AAA diagnosed this year and is currently being managed by vascular surgery as outpatient. AAA stable on CT.  - Continue home aspirin - Restart home statin when taking PO - Holding Coreg as above.  - consider addition of statin at d/c.   # AKI on CKD III.  Resolved. Cr  2.27 on admission. Baseline 1.2-1.5. Pre-renal in setting of acute pancreatitis, poor po intake. Diarrhea may have contributed as well.  - Creatinine improved to baseline.  - Holding IVF for now.  - Continue to mointor  # COPD. Not on any medications. Clinically stable.  - Continue to monitor  # Chronic Diarrhea. Unclear etiology. May be related to chronic pancreatitis vs. Other etiology. Unlikely to be c diff, though possible given recent antibiotic. Holding off on further significant workup for now. If it continues at discharge, then will need further workup as an outpatient.  - No diarrhea at this time.  - Consider GI referral as outpatient if persists.   # TBI / Seizure disorder / PTSD - Continue home depakote - Consider checking valproate level if continues to be lethargic  # Sinusitis: recent URI symptoms, started on amoxicillin for sinus infection on 4/28 - Hold amox  - Monitor URI symptoms  FEN/GI: Soft - Full diet, MIVF stopped.  Prophylaxis: SubQ heparin  Disposition: Better today. Likely home tomorrow if he continues to do well.   Subjective:  SOB improved. Pt. Much more alert and oriented today. He is able to maintain a conversation, and is speaking more clearly. He says that he does feel better, and that his abdominal pain is much better. He is lying flat in bed without SOB. He says he does not need the oxygen. He also is asking for water, and he says that he would like to eat breakfast.    Objective: Temp:  [97.5 F (36.4 C)-98.6 F (37 C)] 98.3 F (36.8 C) (05/05 0526) Pulse Rate:  [75-85] 80 (05/05 0526) Resp:  [16-24] 16 (05/05 0526) BP: (91-145)/(55-77) 91/55 mmHg (05/05 0526) SpO2:  [98 %] 98 % (05/05 0526) Weight:  [160 lb 15 oz (73 kg)] 160 lb 15 oz (73 kg) (05/05 0526) Physical Exam: General: NAD, AAOx2  Cardiovascular: S1 and S2 noted. No murmurs. Regular rate and rhythm Respiratory: Fine crackles at BL bases, improving. No rales, WOB appropriate, not  tachypneic. [place] in place at 1L.  Abdomen: Little if any epigastric tenderness to exam today. He says it feels better than yesterday, no peritoneal signs, no guarding, +BS this am. No distension.  Extremities: WWP, no edema to exam, 2+ distal pulses.  Neuro: Oriented x 2.   Laboratory:  Recent Labs Lab 02/22/15 1112 02/23/15 1100 02/24/15 0538  WBC 12.1* 18.7* 13.6*  HGB 9.8* 11.6* 11.7*  HCT 27.9* 32.8* 33.5*  PLT 107* 115* 109*    Recent Labs Lab 02/20/15 1419  02/22/15 1112 02/23/15 1100 02/24/15 0538  NA 130*  < > 139 138 138  K 4.2  < > 3.7 4.0 3.4*  CL 93*  < > 109 107 105  CO2 24  < > 22 18* 21*  BUN 35*  < > 19 18 22*  CREATININE 2.27*  < > 1.42* 1.25* 1.26*  CALCIUM 8.9  < > 8.6* 8.7* 8.6*  PROT 6.8  --  5.1*  --   --  BILITOT 0.7  --  0.5  --   --   ALKPHOS 50  --  41  --   --   ALT 7  --  8*  --   --   AST 15  --  15  --   --   GLUCOSE 106*  < > 81 106* 94  < > = values in this interval not displayed.- Troponin negative - Lipid Panel: Total Cholesterol 79, Triglycerides 63, HDL 28, LDL 38 - Lactic Acid 1.44 - UA: normal - BNP > 4500 - Troponin 0.49 > 0.47 > 0.34 > 0.25  Imaging/Diagnostic Tests: Dg Chest Port 1 View  02/23/2015   CLINICAL DATA:  One day history of shortness of breath  EXAM: PORTABLE CHEST - 1 VIEW  COMPARISON:  February 20, 2015  FINDINGS: There is patchy airspace consolidation in both lower lobes. Elsewhere lungs are clear. Heart is upper normal in size with pulmonary vascularity within normal limits. Patient is status post coronary artery bypass grafting. No adenopathy. No bone lesions.  IMPRESSION: Patchy airspace consolidation in both lower lobes. Suspect pneumonia, although atypical appearance of congestive heart failure may present in this manner. Both entities may exist concurrently.   Electronically Signed   By: Bretta Bang III M.D.   On: 02/23/2015 17:46   2D Echocardiogram 5/1:  Study Conclusions  - Left ventricle: The cavity  size was normal. Wall thickness was increased in a pattern of moderate LVH. Systolic function was mildly to moderately reduced. The estimated ejection fraction was in the range of 40% to 45%. Basal to mid inferior and apical akinesis. Doppler parameters are consistent with abnormal left ventricular relaxation (grade 1 diastolic dysfunction). The E/e&' ratio is <8, suggesting normal LV filling pressure. - Mitral valve: Calcified annulus. There was trivial regurgitation. - Left atrium: The atrium was normal in size. - Atrial septum: There was increased thickness of the septum, consistent with lipomatous hypertrophy. - Inferior vena cava: The vessel was normal in size. The respirophasic diameter changes were in the normal range (>= 50%), consistent with normal central venous pressure.  Impressions:  - Compared to the prior study in 2013, the LVEF has improved to 40-45% - there are persistent inferior and apical wall motion abnormalities, probably representing scar.   Yolande Jolly, MD 02/25/2015, 8:26 AM PGY-1, [facility] FPTS Intern pager: [phone], text pages welcome

## 2015-02-25 NOTE — Progress Notes (Signed)
Utilization review complete. Tamecka Milham RN CCM Case Mgmt phone [phone] 

## 2015-02-25 NOTE — Evaluation (Signed)
Physical Therapy Evaluation Patient Details Name: [name] MRN: [id number] DOB: [birth year] Today's Date: 02/25/2015   History of Present Illness  64 y.o. male presenting with abdominal pain found to have acute on chronic pancreatitis. PMH is significant for HFrEF, CAD s/p CABG, COPD, TBI, seizures, PTSD  Clinical Impression  Pt admitted with above diagnosis. Pt currently with functional limitations due to the deficits listed below (see PT Problem List). Ambulates up to 60 feet with min assist for balance loss. Reports SOB, however SpO2 of 96% on room air, HR 112. Wife appears to provide good support at home. Will follow and progress. Pt will benefit from skilled PT to increase their independence and safety with mobility to allow discharge to the venue listed below.       Follow Up Recommendations Outpatient PT    Equipment Recommendations  None recommended by PT    Recommendations for Other Services       Precautions / Restrictions Precautions Precautions: Fall Restrictions Weight Bearing Restrictions: No      Mobility  Bed Mobility Overal bed mobility: Needs Assistance Bed Mobility: Supine to Sit     Supine to sit: Min assist;HOB elevated     General bed mobility comments: min assist for truncal support to rise, using bed rail.  Transfers Overall transfer level: Needs assistance Equipment used: 1 person hand held assist Transfers: Sit to/from Stand Sit to Stand: Min assist         General transfer comment: Min assist for balance. Performed from lowest bed setting and BSC. VC for upright posture upon standing.  Ambulation/Gait Ambulation/Gait assistance: Min assist Ambulation Distance (Feet): 60 Feet Assistive device: 1 person hand held assist Gait Pattern/deviations: Step-through pattern;Decreased stride length;Trunk flexed;Drifts right/left Gait velocity: decreased Gait velocity interpretation: Below normal speed for age/gender General Gait Details:  Min assist, hand held support for balance with moderate cues for directions. Slow, especially with turns. LOB to Lt and Rt at times, min assist to correct. Pt reports SOB, SpO2 however 96% on room air, HR 112.  Stairs            Wheelchair Mobility    Modified Rankin (Stroke Patients Only)       Balance Overall balance assessment: Needs assistance Sitting-balance support: No upper extremity supported;Feet supported Sitting balance-Leahy Scale: Fair     Standing balance support: Single extremity supported Standing balance-Leahy Scale: Poor                               Pertinent Vitals/Pain Pain Assessment: No/denies pain    Home Living Family/patient expects to be discharged to:: Private residence Living Arrangements: Spouse/significant other;Children Available Help at Discharge: Family;Available 24 hours/day Type of Home: House Home Access: Stairs to enter Entrance Stairs-Rails: Right Entrance Stairs-Number of Steps: 2-3 Home Layout: One level Home Equipment: Walker - 2 wheels      Prior Function Level of Independence: Independent               Hand Dominance        Extremity/Trunk Assessment   Upper Extremity Assessment: Defer to OT evaluation           Lower Extremity Assessment: Generalized weakness         Communication   Communication: No difficulties  Cognition Arousal/Alertness: Lethargic Behavior During Therapy: Flat affect Overall Cognitive Status: History of cognitive impairments - at baseline (per wife, pt takes a long time to "  come around" after waking)                      General Comments General comments (skin integrity, edema, etc.): Wife states pts cognition take a while in AM for him to be more alert. States he is independent at home but needs cues for motivation due to PTSD. She does not wish for him to have PT at home, reports she can do everything for him that he needs.    Exercises         Assessment/Plan    PT Assessment Patient needs continued PT services  PT Diagnosis Abnormality of gait;Generalized weakness;Difficulty walking   PT Problem List Decreased strength;Decreased balance;Decreased mobility;Decreased knowledge of use of DME;Decreased cognition;Decreased activity tolerance  PT Treatment Interventions DME instruction;Gait training;Stair training;Functional mobility training;Therapeutic activities;Therapeutic exercise;Balance training;Neuromuscular re-education;Cognitive remediation;Patient/family education   PT Goals (Current goals can be found in the Care Plan section) Acute Rehab PT Goals Patient Stated Goal: none stated PT Goal Formulation: With patient/family Time For Goal Achievement: 03/11/15 Potential to Achieve Goals: Good    Frequency Min 3X/week   Barriers to discharge        Co-evaluation               End of Session Equipment Utilized During Treatment: Gait belt Activity Tolerance: Patient tolerated treatment well Patient left: in chair;with call bell/phone within reach;with family/visitor present Nurse Communication: Mobility status         Time: 1218-1252 PT Time Calculation (min) (ACUTE ONLY): 34 min   Charges:   PT Evaluation $Initial PT Evaluation Tier I: 1 Procedure PT Treatments $Gait Training: 8-22 mins   PT G CodesBerton Mount 02/25/2015, 2:31 PM [place], [place] [phone]

## 2015-02-25 NOTE — Care Management Note (Signed)
Case Management Note  Patient Details  Name: Austin LewandowskyRobert C Amon II MRN: [id number] Date of Birth: [birth year]  Subjective/Objective:                 Acute Pancreatitis  Action/Plan: Home with wife  Expected Discharge Date:  02/26/15               Expected Discharge Plan:  Home/Self Care  In-House Referral:     Discharge planning Services  CM Consult   Status of Service:  Completed, signed off  Medicare Important Message Given:  Yes Date Medicare IM Given:  02/24/15 Medicare IM give by:  Isidoro DonningAlesia Kylar Leonhardt RN CCM  Date Additional Medicare IM Given:    Additional Medicare Important Message give by:     If discussed at Long Length of Stay Meetings, dates discussed:    Additional Comments:  02/24/2015 1100 NCM spoke to wife and states she provided pt's care at home. Waiting PT final recommendation for home. Explained to wife that HHPT maybe needed. Will follow up with pt's [facility] States prior to admission pt did not need any DME for ambulation. She care for pt at home and is with him 24 hours per day. Isidoro DonningAlesia Ranveer Wahlstrom RN CCM Case Mgmt phone [phone]  Elliot CousinShavis, Shashwat Cleary Ellen, RN 02/25/2015, 2:34 PM

## 2015-02-26 LAB — BASIC METABOLIC PANEL
Anion gap: 10 (ref 5–15)
BUN: 40 mg/dL — ABNORMAL HIGH (ref 6–20)
CO2: 26 mmol/L (ref 22–32)
CREATININE: 1.41 mg/dL — AB (ref 0.61–1.24)
Calcium: 8.3 mg/dL — ABNORMAL LOW (ref 8.9–10.3)
Chloride: 96 mmol/L — ABNORMAL LOW (ref 101–111)
GFR, EST AFRICAN AMERICAN: 59 mL/min — AB (ref >60)
GFR, EST NON AFRICAN AMERICAN: 51 mL/min — AB (ref >60)
GLUCOSE: 89 mg/dL (ref 70–99)
Potassium: 3.1 mmol/L — ABNORMAL LOW (ref 3.5–5.1)
Sodium: 132 mmol/L — ABNORMAL LOW (ref 135–145)

## 2015-02-26 LAB — CBC
HCT: 32.1 % — ABNORMAL LOW (ref 39.0–52.0)
Hemoglobin: 11.4 g/dL — ABNORMAL LOW (ref 13.0–17.0)
MCH: 34.9 pg — ABNORMAL HIGH (ref 26.0–34.0)
MCHC: 35.5 g/dL (ref 30.0–36.0)
MCV: 98.2 fL (ref 78.0–100.0)
Platelets: 110 10*3/uL — ABNORMAL LOW (ref 150–400)
RBC: 3.27 MIL/uL — AB (ref 4.22–5.81)
RDW: 12.8 % (ref 11.5–15.5)
WBC: 15.5 10*3/uL — AB (ref 4.0–10.5)

## 2015-02-26 MED ORDER — ENSURE ENLIVE PO LIQD: 237.0000 mL | Freq: Three times a day (TID) | ORAL | Status: DC

## 2015-02-26 MED ORDER — TAMSULOSIN HCL 0.4 MG PO CAPS: 0.4000 mg | ORAL_CAPSULE | Freq: Every day | ORAL | Status: DC

## 2015-02-26 NOTE — Progress Notes (Signed)
Discharge documentation reviewed with wife; allowing time for questions. Wife verbalized understanding. IV removed without issue. Pt to leave unit via wheelchair accompanied by Tech.

## 2015-02-26 NOTE — Discharge Instructions (Signed)
We are glad that you are feeling better.   It is important to continue to take all of your medications at discharge.   You will need to take a nutritional supplement. Ensure is the best. If unable to tolerate the Ensure, then you should make sure to eat full meals at least three times per day.   You will follow up with Dr. Adriana Simasook on 5/12 at 10:30 am.   Thanks for letting us take care of you.   Sincerely,  Devota Pacealeb Starletta Houchin, MD Family Medicine - PGY 1    Acute Pancreatitis Acute pancreatitis is a disease in which the pancreas becomes suddenly irritated (inflamed). The pancreas is a large gland behind your stomach. The pancreas makes enzymes that help digest food. The pancreas also makes 2 hormones that help control your blood sugar. Acute pancreatitis happens when the enzymes attack and damage the pancreas. Most attacks last a couple of days and can cause serious problems. HOME CARE  Follow your doctor's diet instructions. You may need to avoid alcohol and limit fat in your diet.  Eat small meals often.  Drink enough fluids to keep your pee (urine) clear or pale yellow.  Only take medicines as told by your doctor.  Avoid drinking alcohol if it caused your disease.  Do not smoke.  Get plenty of rest.  Check your blood sugar at home as told by your doctor.  Keep all doctor visits as told. GET HELP IF:  You do not get better as quickly as expected.  You have new or worsening symptoms.  You have lasting pain, weakness, or feel sick to your stomach (nauseous).  You get better and then have another pain attack. GET HELP RIGHT AWAY IF:   You are unable to eat or keep fluids down.  Your pain becomes severe.  You have a fever or lasting symptoms for more than 2 to 3 days.  You have a fever and your symptoms suddenly get worse.  Your skin or the white part of your eyes turn yellow (jaundice).  You throw up (vomit).  You feel dizzy, or you pass out (faint).  Your blood  sugar is high (over 300 mg/dL). MAKE SURE YOU:   Understand these instructions.  Will watch your condition.  Will get help right away if you are not doing well or get worse. Document Released: 03/27/2008 Document Revised: 02/23/2014 Document Reviewed: 01/18/2012 [facility] Patient Information 2015 [employer] [place]. This information is not intended to replace advice given to you by your health care provider. Make sure you discuss any questions you have with your health care provider.

## 2015-02-26 NOTE — Progress Notes (Signed)
Physical Therapy Treatment Patient Details Name: [name] MRN: [id number] DOB: 08-25-1951 Today's Date: 02/26/2015    History of Present Illness 64 y.o. male presenting with abdominal pain found to have acute on chronic pancreatitis. PMH is significant for HFrEF, CAD s/p CABG, COPD, TBI, seizures, PTSD    PT Comments    Patient continues to progress towards physical therapy goals. Tolerated gait training with a rolling walker today demonstrating mild balance deficits and difficulty in sequencing with turns however did not require physical assist for balance. Min assist to rise from chair with generalized weakness, therefore recommending HHPT at d/c however wife refused yesterday and states she will be available 24/7 for supervision and assistance as needed.  Follow Up Recommendations  Home health PT;Supervision/Assistance - 24 hour     Equipment Recommendations  None recommended by PT    Recommendations for Other Services       Precautions / Restrictions Precautions Precautions: Fall Restrictions Weight Bearing Restrictions: No    Mobility  Bed Mobility               General bed mobility comments: Sitting in chair.  Transfers Overall transfer level: Needs assistance Equipment used: Rolling walker (2 wheeled) Transfers: Sit to/from Stand Sit to Stand: Min assist         General transfer comment: Min assist for balance. Performed from reclining chair. Cues for technique and hand placement prior to rising and to reach back for chair prior to sitting.  Ambulation/Gait Ambulation/Gait assistance: Min guard Ambulation Distance (Feet): 65 Feet Assistive device: Rolling walker (2 wheeled) Gait Pattern/deviations: Step-through pattern;Decreased stride length;Decreased step length - right;Drifts right/left;Trunk flexed (LLE externally rotated) Gait velocity: decreased Gait velocity interpretation: Below normal speed for age/gender General Gait Details: Min guard  for safety. educated on safe DME use with rolling walker for stability. Increased VC required for navigating in narrow space. moderate sway noted but pt able to self correct. VC for sequencing with turns at times and for walker placement with proximity. did not require physical assist for balance with this device   Stairs            Wheelchair Mobility    Modified Rankin (Stroke Patients Only)       Balance                                    Cognition Arousal/Alertness: Lethargic Behavior During Therapy: Flat affect Overall Cognitive Status: History of cognitive impairments - at baseline (per wife, pt takes a long time to "come around" after waking)                      Exercises      General Comments General comments (skin integrity, edema, etc.): SpO2 91% at rest HR 101 - Ambulating SpO2 96%, HR 99 - Room air throughout therapy session.      Pertinent Vitals/Pain Pain Assessment: No/denies pain    Home Living                      Prior Function            PT Goals (current goals can now be found in the care plan section) Acute Rehab PT Goals Patient Stated Goal: none stated PT Goal Formulation: With patient/family Time For Goal Achievement: 03/11/15 Potential to Achieve Goals: Good Progress towards PT goals: Progressing toward goals  Frequency  Min 3X/week    PT Plan Discharge plan needs to be updated    Co-evaluation             End of Session Equipment Utilized During Treatment: Gait belt Activity Tolerance: Patient tolerated treatment well Patient left: in chair;with call bell/phone within reach     Time: 1324-4010 PT Time Calculation (min) (ACUTE ONLY): 17 min  Charges:  $Gait Training: 8-22 mins                    G Codes:      [name] 02-28-15, 5:02 PM [name] [name], [name] [phone]

## 2015-02-26 NOTE — Progress Notes (Signed)
[facility]. Wife refusing HH PT and states she will be with pt 24 hours. He has RW at home. Offered outpt PT, wife states she will discuss with pt's PCP if she decides on therapies in the future. Isidoro DonningAlesia Janis Cuffe RN CCM Case Mgmt phone [phone]

## 2015-02-27 ENCOUNTER — Inpatient Hospital Stay (HOSPITAL_COMMUNITY)
Admission: EM | Admit: 2015-02-27 | Discharge: 2015-03-02 | DRG: 640 | Disposition: A | Discharge status: Home Health | Payer: Medicare Other | Attending: Family Medicine | Admitting: Family Medicine

## 2015-02-27 ENCOUNTER — Encounter (HOSPITAL_COMMUNITY): Payer: Self-pay | Admitting: Emergency Medicine

## 2015-02-27 ENCOUNTER — Emergency Department (HOSPITAL_COMMUNITY): Payer: Medicare Other

## 2015-02-27 ENCOUNTER — Other Ambulatory Visit (HOSPITAL_COMMUNITY): Payer: Self-pay

## 2015-02-27 DIAGNOSIS — I10 Essential (primary) hypertension: Secondary | ICD-10-CM | POA: Diagnosis present

## 2015-02-27 DIAGNOSIS — Z681 Body mass index (BMI) 19 or less, adult: Secondary | ICD-10-CM

## 2015-02-27 DIAGNOSIS — E876 Hypokalemia: Principal | ICD-10-CM | POA: Diagnosis present

## 2015-02-27 DIAGNOSIS — R531 Weakness: Secondary | ICD-10-CM

## 2015-02-27 DIAGNOSIS — Z87820 Personal history of traumatic brain injury: Secondary | ICD-10-CM

## 2015-02-27 DIAGNOSIS — I509 Heart failure, unspecified: Secondary | ICD-10-CM | POA: Insufficient documentation

## 2015-02-27 DIAGNOSIS — R609 Edema, unspecified: Secondary | ICD-10-CM

## 2015-02-27 DIAGNOSIS — I2510 Atherosclerotic heart disease of native coronary artery without angina pectoris: Secondary | ICD-10-CM | POA: Diagnosis present

## 2015-02-27 DIAGNOSIS — Z515 Encounter for palliative care: Secondary | ICD-10-CM

## 2015-02-27 DIAGNOSIS — Z7982 Long term (current) use of aspirin: Secondary | ICD-10-CM

## 2015-02-27 DIAGNOSIS — Z79899 Other long term (current) drug therapy: Secondary | ICD-10-CM

## 2015-02-27 DIAGNOSIS — G928 Other toxic encephalopathy: Secondary | ICD-10-CM | POA: Diagnosis present

## 2015-02-27 DIAGNOSIS — G92 Toxic encephalopathy: Secondary | ICD-10-CM

## 2015-02-27 DIAGNOSIS — E871 Hypo-osmolality and hyponatremia: Secondary | ICD-10-CM

## 2015-02-27 DIAGNOSIS — F4310 Post-traumatic stress disorder, unspecified: Secondary | ICD-10-CM | POA: Diagnosis present

## 2015-02-27 DIAGNOSIS — E785 Hyperlipidemia, unspecified: Secondary | ICD-10-CM | POA: Diagnosis present

## 2015-02-27 DIAGNOSIS — K861 Other chronic pancreatitis: Secondary | ICD-10-CM | POA: Diagnosis present

## 2015-02-27 DIAGNOSIS — I5022 Chronic systolic (congestive) heart failure: Secondary | ICD-10-CM

## 2015-02-27 DIAGNOSIS — Z951 Presence of aortocoronary bypass graft: Secondary | ICD-10-CM

## 2015-02-27 DIAGNOSIS — R4182 Altered mental status, unspecified: Secondary | ICD-10-CM | POA: Diagnosis not present

## 2015-02-27 DIAGNOSIS — E7800 Pure hypercholesterolemia, unspecified: Secondary | ICD-10-CM | POA: Diagnosis present

## 2015-02-27 DIAGNOSIS — F17210 Nicotine dependence, cigarettes, uncomplicated: Secondary | ICD-10-CM | POA: Diagnosis present

## 2015-02-27 DIAGNOSIS — E43 Unspecified severe protein-calorie malnutrition: Secondary | ICD-10-CM

## 2015-02-27 DIAGNOSIS — J449 Chronic obstructive pulmonary disease, unspecified: Secondary | ICD-10-CM | POA: Diagnosis present

## 2015-02-27 DIAGNOSIS — G40909 Epilepsy, unspecified, not intractable, without status epilepticus: Secondary | ICD-10-CM | POA: Diagnosis present

## 2015-02-27 HISTORY — DX: Acute pancreatitis without necrosis or infection, unspecified: K85.90

## 2015-02-27 LAB — CBC WITH DIFFERENTIAL/PLATELET
BASOS ABS: 0.2 10*3/uL — AB (ref 0.0–0.1)
BASOS PCT: 1 % (ref 0–1)
Eosinophils Absolute: 0 10*3/uL (ref 0.0–0.7)
Eosinophils Relative: 0 % (ref 0–5)
HCT: 31 % — ABNORMAL LOW (ref 39.0–52.0)
HEMOGLOBIN: 10.9 g/dL — AB (ref 13.0–17.0)
LYMPHS PCT: 9 % — AB (ref 12–46)
Lymphs Abs: 1.8 10*3/uL (ref 0.7–4.0)
MCH: 34.6 pg — ABNORMAL HIGH (ref 26.0–34.0)
MCHC: 35.2 g/dL (ref 30.0–36.0)
MCV: 98.4 fL (ref 78.0–100.0)
MONOS PCT: 8 % (ref 3–12)
Monocytes Absolute: 1.6 10*3/uL — ABNORMAL HIGH (ref 0.1–1.0)
NEUTROS ABS: 16 10*3/uL — AB (ref 1.7–7.7)
NEUTROS PCT: 82 % — AB (ref 43–77)
Platelets: 110 10*3/uL — ABNORMAL LOW (ref 150–400)
RBC: 3.15 MIL/uL — AB (ref 4.22–5.81)
RDW: 12.8 % (ref 11.5–15.5)
WBC: 19.6 10*3/uL — ABNORMAL HIGH (ref 4.0–10.5)

## 2015-02-27 LAB — COMPREHENSIVE METABOLIC PANEL
ALK PHOS: 63 U/L (ref 38–126)
ALT: 8 U/L — AB (ref 17–63)
ANION GAP: 6 (ref 5–15)
AST: 21 U/L (ref 15–41)
Albumin: 1.8 g/dL — ABNORMAL LOW (ref 3.5–5.0)
BILIRUBIN TOTAL: 0.7 mg/dL (ref 0.3–1.2)
BUN: 34 mg/dL — AB (ref 6–20)
CO2: 25 mmol/L (ref 22–32)
Calcium: 8.1 mg/dL — ABNORMAL LOW (ref 8.9–10.3)
Chloride: 94 mmol/L — ABNORMAL LOW (ref 101–111)
Creatinine, Ser: 1.34 mg/dL — ABNORMAL HIGH (ref 0.61–1.24)
GFR, EST NON AFRICAN AMERICAN: 54 mL/min — AB (ref >60)
GLUCOSE: 109 mg/dL — AB (ref 70–99)
Potassium: 2.8 mmol/L — ABNORMAL LOW (ref 3.5–5.1)
SODIUM: 125 mmol/L — AB (ref 135–145)
Total Protein: 5.2 g/dL — ABNORMAL LOW (ref 6.5–8.1)

## 2015-02-27 LAB — CULTURE, BLOOD (ROUTINE X 2)
CULTURE: NO GROWTH
Culture: NO GROWTH

## 2015-02-27 LAB — BRAIN NATRIURETIC PEPTIDE: B Natriuretic Peptide: 631.3 pg/mL — ABNORMAL HIGH (ref 0.0–100.0)

## 2015-02-27 LAB — LIPASE, BLOOD: Lipase: 84 U/L — ABNORMAL HIGH (ref 22–51)

## 2015-02-27 LAB — TROPONIN I: Troponin I: 0.08 ng/mL — ABNORMAL HIGH (ref <0.031)

## 2015-02-27 MED ORDER — SODIUM CHLORIDE 0.9 % IV BOLUS (SEPSIS)
500.0000 mL | Freq: Once | INTRAVENOUS | Status: AC
  Administered 2015-02-27: 500 mL via INTRAVENOUS

## 2015-02-27 NOTE — ED Notes (Addendum)
Family is concerned about pt.'s swelling at hands , feet and ankles onset today with generalized weakness , he was discharged here yesterday after being admitted for pancreatitis. Denies SOB / no fever or chills. Hypotensive at triage.

## 2015-02-27 NOTE — ED Provider Notes (Signed)
CSN: [id number]     Arrival date & time 02/27/15  2140 History  This chart was scribed for Geoffery Lyonsouglas Araseli Sherry, MD by Evon Slackerrance Branch, ED Scribe. This patient was seen in room A07C/A07C and the patient's care was started at 11:13 PM.    Chief Complaint  Patient presents with  . Foot Swelling  . Hypotension    The history is provided by the spouse. No language interpreter was used.   HPI Comments: [name] is a 64 y.o. male with PMHx of CHF, PTSD,  who presents to the Emergency Department complaining of swelling in the ankle that began today. Wife states that he is also weak. Wife states that he has a Hx of CHF and pancreatitis, she states that he was recently admitted for the pancreatitis 1 week ago and discharged yesterday. Wife states that she has noticed that the fluid build up has been getting worse. Wife states that she noticed he has gained about 10 pounds over the past week and she thinks fluid is building up from his CHF. Wife states that at base line he is able to ambulate on his own and complete all his activities with no assistance. Wife doesn't report any treatments tried. Wife also states he has a Hx of PTSD that's worse when he is pain.      Past Medical History  Diagnosis Date  . Seizure disorder   . Nausea   . History of heartburn   . Chronic back pain   . IHD (ischemic heart disease)   . Cigarette smoker   . Hypercholesterolemia   . CHF (congestive heart failure)     EF had been 20% initially. 2010 EF 35-40%.  . Traumatic brain injury   . Seizures   . COPD (chronic obstructive pulmonary disease)   . Pancreatitis    Past Surgical History  Procedure Laterality Date  . Cardiac catheterization  03/12/2007  . Coronary artery bypass graft  2000    x6, graft to the PDA and LAD, patent saphenous vein graft to acute marginal and posterior lateral severely diseased saphenous vein graft to diagonal. Patent LIMA to the LAD.  Marland Kitchen. Shoulder surgery      right  . Facial  reconstruction surgery    . Back surgery     Family History  Problem Relation Age of Onset  . Heart disease Brother   . Hypertension Brother   . Heart attack Brother   . Heart disease Sister   . Coronary artery disease Brother   . Hypertension Brother   . Heart attack Brother   . Coronary artery disease Sister   . Diabetes Brother   . Thyroid disease Brother   . Heart attack Brother   . Diabetes Father   . Heart disease Father   . Hypertension Father   . Heart attack Father   . Hypertension Mother   . Varicose Veins Mother    History  Substance Use Topics  . Smoking status: Current Every Day Smoker -- 0.50 packs/day for 50 years    Types: Cigarettes  . Smokeless tobacco: Never Used  . Alcohol Use: No    Review of Systems  Cardiovascular: Positive for leg swelling.  All other systems reviewed and are negative.     Allergies  Aspirin and Morphine and related  Home Medications   Prior to Admission medications   Medication Sig Start Date End Date Taking? Authorizing Provider  acetaminophen (TYLENOL) 650 MG CR tablet Take 650 mg by  mouth every 8 (eight) hours as needed for pain.    Historical Provider, MD  aspirin 325 MG tablet Take 325 mg by mouth daily.      Historical Provider, MD  carvedilol (COREG) 6.25 MG tablet Take 1 tablet (6.25 mg total) by mouth 2 (two) times daily. 04/10/14   Rollene RotundaJames Hochrein, MD  divalproex (DEPAKOTE) 500 MG EC tablet Take 500 mg by mouth 3 (three) times daily. [place] [place]    Historical Provider, MD  docusate sodium (COLACE) 100 MG capsule Take 1 capsule (100 mg total) by mouth 2 (two) times daily as needed for mild constipation. Patient not taking: Reported on 02/20/2015 03/30/14   Stevie Kernyan McLennan, MD  feeding supplement, ENSURE ENLIVE, (ENSURE ENLIVE) LIQD Take 237 mLs by mouth 3 (three) times daily between meals. 02/26/15   Yolande Jollyaleb G Melancon, MD  furosemide (LASIX) 40 MG tablet TAKE ONE TABLET BY MOUTH EVERY OTHER  DAY    Rollene RotundaJames Hochrein, MD  lisinopril (PRINIVIL,ZESTRIL) 5 MG tablet Take 1 tablet (5 mg total) by mouth daily. Patient taking differently: Take 5 mg by mouth at bedtime.  05/14/14   Rollene RotundaJames Hochrein, MD  ondansetron (ZOFRAN) 4 MG tablet Take 1 tablet (4 mg total) by mouth every 8 (eight) hours as needed for nausea or vomiting. Patient not taking: Reported on 02/20/2015 11/11/14   Tommie SamsJayce G Cook, DO  oxyCODONE-acetaminophen (PERCOCET/ROXICET) 5-325 MG per tablet Take 1 tablet by mouth every 6 (six) hours as needed for severe pain. Patient not taking: Reported on 02/20/2015 03/30/14   Stevie Kernyan McLennan, MD  pravastatin (PRAVACHOL) 40 MG tablet Take 1 tablet (40 mg total) by mouth daily. 05/14/14   Rollene RotundaJames Hochrein, MD  ranitidine (ZANTAC) 150 MG capsule Take 300 mg by mouth daily.     Historical Provider, MD  tamsulosin (FLOMAX) 0.4 MG CAPS capsule Take 1 capsule (0.4 mg total) by mouth daily. 02/26/15   Hillery Hunteraleb G Melancon, MD   BP 89/60 mmHg  Temp(Src) 98.2 F (36.8 C) (Oral)  Resp 14  Ht 6\' 1"  (1.854 m)  Wt 161 lb (73.029 kg)  BMI 21.25 kg/m2  SpO2 98%   Physical Exam  Constitutional: He appears well-developed and well-nourished. No distress.  HENT:  Head: Normocephalic and atraumatic.  Eyes: Conjunctivae and EOM are normal.  Neck: Neck supple. No tracheal deviation present.  Cardiovascular: Normal rate.   Pulmonary/Chest: Effort normal. No respiratory distress.  Musculoskeletal: Normal range of motion. He exhibits edema.  Trace edema of hands and feet.   Neurological: He is alert.  Pt is somnolent, but arousable. He speaks with gargled speech that is difficult to comprehend. He moves all 4 extremities and follows commands.   Skin: Skin is warm and dry.  Psychiatric: He has a normal mood and affect. His behavior is normal.  Nursing note and vitals reviewed.   ED Course  Procedures (including critical care time) DIAGNOSTIC STUDIES: Oxygen Saturation is 98% on RA, normal by my interpretation.     COORDINATION OF CARE: 11:30 PM-Discussed treatment plan with pt at bedside and pt agreed to plan.      Labs Review Labs Reviewed  CBC WITH DIFFERENTIAL/PLATELET - Abnormal; Notable for the following:    WBC 19.6 (*)    RBC 3.15 (*)    Hemoglobin 10.9 (*)    HCT 31.0 (*)    MCH 34.6 (*)    Platelets 110 (*)    All other components within normal limits  COMPREHENSIVE METABOLIC PANEL -  Abnormal; Notable for the following:    Sodium 125 (*)    Potassium 2.8 (*)    Chloride 94 (*)    Glucose, Bld 109 (*)    BUN 34 (*)    Creatinine, Ser 1.34 (*)    Calcium 8.1 (*)    Total Protein 5.2 (*)    Albumin 1.8 (*)    ALT 8 (*)    GFR calc non Af Amer 54 (*)    All other components within normal limits  BRAIN NATRIURETIC PEPTIDE - Abnormal; Notable for the following:    B Natriuretic Peptide 631.3 (*)    All other components within normal limits  LIPASE, BLOOD  TROPONIN I    Imaging Review No results found.  ED ECG REPORT   Date: 02/27/2015  Rate: 70  Rhythm: normal sinus rhythm  QRS Axis: normal  Intervals: normal  ST/T Wave abnormalities: nonspecific T wave changes  Conduction Disutrbances:none  Narrative Interpretation:   Old EKG Reviewed: none available  I have personally reviewed the EKG tracing and agree with the computerized printout as noted.   MDM   Final diagnoses:  Edema, peripheral      Patient presents here for evaluation of weakness. He was recently admitted and discharged for pancreatitis. He went home yesterday. Since arriving home, he has become weaker and less responsive. He is unable to walk on his own. His workup reveals worsening hyponatremia and hypokalemia. He was given gentle hydration and will be admitted to the family medicine service.   I personally performed the services described in this documentation, which was scribed in my presence. The recorded information has been reviewed and is accurate.      Geoffery Lyons, MD 02/28/15  [phone]

## 2015-02-28 ENCOUNTER — Encounter (HOSPITAL_COMMUNITY): Payer: Self-pay

## 2015-02-28 DIAGNOSIS — J449 Chronic obstructive pulmonary disease, unspecified: Secondary | ICD-10-CM | POA: Diagnosis present

## 2015-02-28 DIAGNOSIS — E785 Hyperlipidemia, unspecified: Secondary | ICD-10-CM | POA: Diagnosis present

## 2015-02-28 DIAGNOSIS — Z951 Presence of aortocoronary bypass graft: Secondary | ICD-10-CM | POA: Diagnosis not present

## 2015-02-28 DIAGNOSIS — K861 Other chronic pancreatitis: Secondary | ICD-10-CM | POA: Diagnosis present

## 2015-02-28 DIAGNOSIS — Z7982 Long term (current) use of aspirin: Secondary | ICD-10-CM | POA: Diagnosis not present

## 2015-02-28 DIAGNOSIS — E871 Hypo-osmolality and hyponatremia: Secondary | ICD-10-CM | POA: Diagnosis present

## 2015-02-28 DIAGNOSIS — G92 Toxic encephalopathy: Secondary | ICD-10-CM | POA: Diagnosis present

## 2015-02-28 DIAGNOSIS — F4310 Post-traumatic stress disorder, unspecified: Secondary | ICD-10-CM | POA: Diagnosis present

## 2015-02-28 DIAGNOSIS — I2510 Atherosclerotic heart disease of native coronary artery without angina pectoris: Secondary | ICD-10-CM | POA: Diagnosis present

## 2015-02-28 DIAGNOSIS — R531 Weakness: Secondary | ICD-10-CM

## 2015-02-28 DIAGNOSIS — F17210 Nicotine dependence, cigarettes, uncomplicated: Secondary | ICD-10-CM | POA: Diagnosis present

## 2015-02-28 DIAGNOSIS — Z681 Body mass index (BMI) 19 or less, adult: Secondary | ICD-10-CM | POA: Diagnosis not present

## 2015-02-28 DIAGNOSIS — R609 Edema, unspecified: Secondary | ICD-10-CM | POA: Insufficient documentation

## 2015-02-28 DIAGNOSIS — G928 Other toxic encephalopathy: Secondary | ICD-10-CM | POA: Diagnosis present

## 2015-02-28 DIAGNOSIS — Z87820 Personal history of traumatic brain injury: Secondary | ICD-10-CM | POA: Diagnosis not present

## 2015-02-28 DIAGNOSIS — E43 Unspecified severe protein-calorie malnutrition: Secondary | ICD-10-CM | POA: Diagnosis present

## 2015-02-28 DIAGNOSIS — I5022 Chronic systolic (congestive) heart failure: Secondary | ICD-10-CM | POA: Diagnosis present

## 2015-02-28 DIAGNOSIS — Z79899 Other long term (current) drug therapy: Secondary | ICD-10-CM | POA: Diagnosis not present

## 2015-02-28 DIAGNOSIS — I10 Essential (primary) hypertension: Secondary | ICD-10-CM | POA: Diagnosis present

## 2015-02-28 DIAGNOSIS — Z515 Encounter for palliative care: Secondary | ICD-10-CM | POA: Diagnosis not present

## 2015-02-28 DIAGNOSIS — E876 Hypokalemia: Secondary | ICD-10-CM | POA: Diagnosis present

## 2015-02-28 DIAGNOSIS — R4182 Altered mental status, unspecified: Secondary | ICD-10-CM | POA: Diagnosis present

## 2015-02-28 DIAGNOSIS — G40909 Epilepsy, unspecified, not intractable, without status epilepticus: Secondary | ICD-10-CM | POA: Diagnosis present

## 2015-02-28 LAB — URINALYSIS, ROUTINE W REFLEX MICROSCOPIC
BILIRUBIN URINE: NEGATIVE
GLUCOSE, UA: NEGATIVE mg/dL
HGB URINE DIPSTICK: NEGATIVE
KETONES UR: NEGATIVE mg/dL
Leukocytes, UA: NEGATIVE
Nitrite: NEGATIVE
Protein, ur: NEGATIVE mg/dL
SPECIFIC GRAVITY, URINE: 1.013 (ref 1.005–1.030)
Urobilinogen, UA: 1 mg/dL (ref 0.0–1.0)
pH: 5 (ref 5.0–8.0)

## 2015-02-28 LAB — BASIC METABOLIC PANEL
ANION GAP: 10 (ref 5–15)
Anion gap: 12 (ref 5–15)
BUN: 31 mg/dL — AB (ref 6–20)
BUN: 26 mg/dL — ABNORMAL HIGH (ref 6–20)
CALCIUM: 8.1 mg/dL — AB (ref 8.9–10.3)
CALCIUM: 8.2 mg/dL — AB (ref 8.9–10.3)
CHLORIDE: 95 mmol/L — AB (ref 101–111)
CO2: 24 mmol/L (ref 22–32)
CO2: 26 mmol/L (ref 22–32)
CREATININE: 1.27 mg/dL — AB (ref 0.61–1.24)
CREATININE: 1.05 mg/dL (ref 0.61–1.24)
Chloride: 95 mmol/L — ABNORMAL LOW (ref 101–111)
GFR calc Af Amer: >60 mL/min (ref >60)
GFR calc Af Amer: >60 mL/min (ref >60)
GFR calc non Af Amer: 58 mL/min — ABNORMAL LOW (ref >60)
GFR calc non Af Amer: >60 mL/min (ref >60)
GLUCOSE: 90 mg/dL (ref 70–99)
Glucose, Bld: 80 mg/dL (ref 70–99)
Potassium: 2.9 mmol/L — ABNORMAL LOW (ref 3.5–5.1)
Potassium: 3.4 mmol/L — ABNORMAL LOW (ref 3.5–5.1)
Sodium: 131 mmol/L — ABNORMAL LOW (ref 135–145)
Sodium: 131 mmol/L — ABNORMAL LOW (ref 135–145)

## 2015-02-28 LAB — CBC
HEMATOCRIT: 26.7 % — AB (ref 39.0–52.0)
HEMOGLOBIN: 9.5 g/dL — AB (ref 13.0–17.0)
MCH: 34.5 pg — ABNORMAL HIGH (ref 26.0–34.0)
MCHC: 35.6 g/dL (ref 30.0–36.0)
MCV: 97.1 fL (ref 78.0–100.0)
Platelets: 71 10*3/uL — ABNORMAL LOW (ref 150–400)
RBC: 2.75 MIL/uL — AB (ref 4.22–5.81)
RDW: 12.8 % (ref 11.5–15.5)
WBC: 12.6 10*3/uL — AB (ref 4.0–10.5)

## 2015-02-28 MED ORDER — ENSURE ENLIVE PO LIQD
237.0000 mL | Freq: Three times a day (TID) | ORAL | Status: DC
  Administered 2015-02-28 – 2015-03-02 (×4): 237 mL via ORAL

## 2015-02-28 MED ORDER — DIVALPROEX SODIUM 500 MG PO DR TAB
500.0000 mg | DELAYED_RELEASE_TABLET | Freq: Three times a day (TID) | ORAL | Status: DC
  Administered 2015-02-28 – 2015-03-02 (×7): 500 mg via ORAL
  Filled 2015-02-28 (×9): qty 1

## 2015-02-28 MED ORDER — POTASSIUM CHLORIDE 10 MEQ/100ML IV SOLN
10.0000 meq | INTRAVENOUS | Status: AC
  Administered 2015-02-28 (×3): 10 meq via INTRAVENOUS
  Filled 2015-02-28 (×6): qty 100

## 2015-02-28 MED ORDER — HEPARIN SODIUM (PORCINE) 5000 UNIT/ML IJ SOLN
5000.0000 [IU] | Freq: Three times a day (TID) | INTRAMUSCULAR | Status: DC
  Administered 2015-02-28: 5000 [IU] via SUBCUTANEOUS
  Filled 2015-02-28 (×3): qty 1

## 2015-02-28 MED ORDER — ONDANSETRON HCL 4 MG PO TABS: 4.0000 mg | ORAL_TABLET | Freq: Four times a day (QID) | ORAL | Status: DC | PRN

## 2015-02-28 MED ORDER — TAMSULOSIN HCL 0.4 MG PO CAPS
0.4000 mg | ORAL_CAPSULE | Freq: Every day | ORAL | Status: DC
  Administered 2015-02-28 – 2015-03-02 (×3): 0.4 mg via ORAL
  Filled 2015-02-28 (×3): qty 1

## 2015-02-28 MED ORDER — PRAVASTATIN SODIUM 40 MG PO TABS
40.0000 mg | ORAL_TABLET | Freq: Every day | ORAL | Status: DC
  Administered 2015-02-28 – 2015-03-02 (×3): 40 mg via ORAL
  Filled 2015-02-28 (×3): qty 1

## 2015-02-28 MED ORDER — SODIUM CHLORIDE 0.9 % IJ SOLN
3.0000 mL | Freq: Two times a day (BID) | INTRAMUSCULAR | Status: DC
  Administered 2015-03-02: 3 mL via INTRAVENOUS

## 2015-02-28 MED ORDER — ONDANSETRON HCL 4 MG/2ML IJ SOLN: 4.0000 mg | Freq: Four times a day (QID) | INTRAMUSCULAR | Status: DC | PRN

## 2015-02-28 MED ORDER — POTASSIUM CHLORIDE 10 MEQ/100ML IV SOLN
10.0000 meq | Freq: Once | INTRAVENOUS | Status: AC
  Filled 2015-02-28: qty 100

## 2015-02-28 MED ORDER — POTASSIUM CHLORIDE 10 MEQ/100ML IV SOLN
10.0000 meq | INTRAVENOUS | Status: AC
  Administered 2015-02-28 (×2): 10 meq via INTRAVENOUS
  Filled 2015-02-28 (×3): qty 100

## 2015-02-28 MED ORDER — POTASSIUM CHLORIDE 10 MEQ/100ML IV SOLN
10.0000 meq | INTRAVENOUS | Status: AC
  Administered 2015-02-28 (×5): 10 meq via INTRAVENOUS
  Filled 2015-02-28 (×5): qty 100

## 2015-02-28 MED ORDER — CHLORHEXIDINE GLUCONATE 0.12 % MT SOLN
15.0000 mL | Freq: Two times a day (BID) | OROMUCOSAL | Status: DC
  Administered 2015-02-28 – 2015-03-02 (×4): 15 mL via OROMUCOSAL
  Filled 2015-02-28 (×6): qty 15

## 2015-02-28 MED ORDER — SODIUM CHLORIDE 0.9 % IV SOLN
INTRAVENOUS | Status: DC
  Administered 2015-02-28 – 2015-03-01 (×3): via INTRAVENOUS

## 2015-02-28 MED ORDER — CETYLPYRIDINIUM CHLORIDE 0.05 % MT LIQD
7.0000 mL | Freq: Two times a day (BID) | OROMUCOSAL | Status: DC
  Administered 2015-02-28 – 2015-03-01 (×3): 7 mL via OROMUCOSAL

## 2015-02-28 NOTE — Progress Notes (Signed)
Palliative Medicine Team consult was received. We will schedule a meeting with patient and her family at the earliest possible time we have a provider available and are currently triaging for acuity based on our high census. If there are urgent needs or questions please call [phone]. Thank you for consulting out team to assist with this patients care.

## 2015-02-28 NOTE — ED Notes (Signed)
Pt made aware of bed assignment 

## 2015-02-28 NOTE — H&P (Signed)
Family Medicine Teaching [facility] Admission History and Physical Service Pager: [phone]  Patient name: [name] Medical record number: [id number] Date of birth: [birth year] Age: 64 y.o. Gender: male  Primary Care Provider: Everlene Other, DO Consultants: none Code Status: Full  Chief Complaint: AMS  Assessment and Plan: [name] is a 64 y.o. male presenting with AMS. PMH is significant for HFrEF, CAD s/p CABG, COPD, TBI, seizures, PTSD, pancreatitis, HLD.  # Acute toxic metabolic encephalopathy: WBC noted 19.6 up from 15.5 on discharge. Electrolyte abnormalities including hyponatremia, hypokalemia, SCr appears same as recent hospitalization. Concern for occult infection, CXR looks much improved and UA is completely normal, no evidence of cellulitis; also no temperature and does appear to be improving with mentation somewhat in ED so will hold off on abx. No report of toxic ingestion but would remain on DDx as well as postictal state with hx of seizures though no report of seizure-like activity in chart review - admit to inpatient - blood cx and UA - replete K with 5x runs IV (thus will get 500cc fluid here) - NS @ 50 cc/hr overnight - continue home depakote 500mg  TID - consider CT/MRI head if develops focal findings, worsens overnight - consider neuro consult if not improving by AM  # HFrEF: would favor him being more dehydrated and edema due to low albumin. S/p 500cc bolus in ED. BNP 600, improved from >4500 last week. On clinical exam he has very dry mucous membranes; need to take caution with rehydration as he went into exacerbation requiring lasix from fluid overload for pancreatitis - gentle hydration overnight 50cc/hr - monitor clinically for respiratory distress/pulmonary edema and will initiate lasix if needed  # Severe protein calorie malnutrition: albumin 1.8 - continue supplemental ensure shakes when mentation improves  # HLD:  - continue home  statin  FEN/GI: NS @ 50cc/hr Prophylaxis: heparin sq  Disposition: admit  History of Present Illness: [name] is a 64 y.o. male presenting with altered mental status. Pt history limited by mental status, no family members at bedside. Most of history per chart. Pt recently admitted to [facility] for acute pancreatitis and CHF exacerbation 2/2 fluid resuscitation for the pancreatitis treatment. He was discharged yesterday. Per ED note his wife brought him back in to the ED because of continued weakness, swelling of the lower legs and concern for fluid build up.   Review Of Systems:  Unable to perform due to mental status  Patient Active Problem List   Diagnosis Date Noted  . Weakness 02/28/2015  . Protein-calorie malnutrition, severe 02/24/2015  . Leukocytosis   . Acute renal failure syndrome   . Hyponatremia   . Post traumatic stress disorder   . Traumatic brain injury   . Chronic systolic congestive heart failure   . Acute pancreatitis 02/20/2015  . Sinusitis 02/18/2015  . Abdominal pain 12/03/2014  . AAA (abdominal aortic aneurysm) 11/11/2014  . S/P CABG (coronary artery bypass graft) 11/10/2014  . Orthostatic hypotension 04/10/2014  . CHRONIC SYSTOLIC HEART FAILURE 08/21/2007  . HYPERCHOLESTEROLEMIA 12/20/2006  . ERECTILE DYSFUNCTION 12/20/2006  . TOBACCO DEPENDENCE 12/20/2006  . CORONARY, ARTERIOSCLEROSIS 12/20/2006  . GASTROESOPHAGEAL REFLUX, NO ESOPHAGITIS 12/20/2006  . CONVULSIONS, SEIZURES, NOS 12/20/2006   Past Medical History: Past Medical History  Diagnosis Date  . Seizure disorder   . Nausea   . History of heartburn   . Chronic back pain   . IHD (ischemic heart disease)   . Cigarette smoker   .  Hypercholesterolemia   . CHF (congestive heart failure)     EF had been 20% initially. 2010 EF 35-40%.  . Traumatic brain injury   . Seizures   . COPD (chronic obstructive pulmonary disease)   . Pancreatitis    Past Surgical History: Past Surgical History   Procedure Laterality Date  . Cardiac catheterization  03/12/2007  . Coronary artery bypass graft  2000    x6, graft to the PDA and LAD, patent saphenous vein graft to acute marginal and posterior lateral severely diseased saphenous vein graft to diagonal. Patent LIMA to the LAD.  Marland Kitchen Shoulder surgery      right  . Facial reconstruction surgery    . Back surgery     Social History: History  Substance Use Topics  . Smoking status: Current Every Day Smoker -- 0.50 packs/day for 50 years    Types: Cigarettes  . Smokeless tobacco: Never Used  . Alcohol Use: No   Additional social history: none  Please also refer to relevant sections of EMR.  Family History: Family History  Problem Relation Age of Onset  . Heart disease Brother   . Hypertension Brother   . Heart attack Brother   . Heart disease Sister   . Coronary artery disease Brother   . Hypertension Brother   . Heart attack Brother   . Coronary artery disease Sister   . Diabetes Brother   . Thyroid disease Brother   . Heart attack Brother   . Diabetes Father   . Heart disease Father   . Hypertension Father   . Heart attack Father   . Hypertension Mother   . Varicose Veins Mother    Allergies and Medications: Allergies  Allergen Reactions  . Aspirin     To much aspirin make his bleed on the inside can take a small amount.  . Morphine And Related Other (See Comments)    "MAKES HIM MEAN"   No current facility-administered medications on file prior to encounter.   Current Outpatient Prescriptions on File Prior to Encounter  Medication Sig Dispense Refill  . acetaminophen (TYLENOL) 650 MG CR tablet Take 650 mg by mouth every 8 (eight) hours as needed for pain.    Marland Kitchen aspirin 325 MG tablet Take 325 mg by mouth daily.      . carvedilol (COREG) 6.25 MG tablet Take 1 tablet (6.25 mg total) by mouth 2 (two) times daily. 60 tablet 11  . divalproex (DEPAKOTE) 500 MG EC tablet Take 500 mg by mouth 3 (three) times daily.  Prescribed by  his neurologist in [place]    . feeding supplement, ENSURE ENLIVE, (ENSURE ENLIVE) LIQD Take 237 mLs by mouth 3 (three) times daily between meals. 237 mL 12  . furosemide (LASIX) 40 MG tablet TAKE ONE TABLET BY MOUTH EVERY OTHER DAY 30 tablet 5  . lisinopril (PRINIVIL,ZESTRIL) 5 MG tablet Take 1 tablet (5 mg total) by mouth daily. (Patient taking differently: Take 5 mg by mouth at bedtime. ) 90 tablet 3  . pravastatin (PRAVACHOL) 40 MG tablet Take 1 tablet (40 mg total) by mouth daily. 90 tablet 3  . ranitidine (ZANTAC) 150 MG capsule Take 300 mg by mouth daily.     . tamsulosin (FLOMAX) 0.4 MG CAPS capsule Take 1 capsule (0.4 mg total) by mouth daily. 30 capsule 2  . docusate sodium (COLACE) 100 MG capsule Take 1 capsule (100 mg total) by mouth 2 (two) times daily as needed for mild constipation. (Patient not taking:  Reported on 02/20/2015) 30 capsule 0  . ondansetron (ZOFRAN) 4 MG tablet Take 1 tablet (4 mg total) by mouth every 8 (eight) hours as needed for nausea or vomiting. (Patient not taking: Reported on 02/20/2015) 20 tablet 0  . oxyCODONE-acetaminophen (PERCOCET/ROXICET) 5-325 MG per tablet Take 1 tablet by mouth every 6 (six) hours as needed for severe pain. (Patient not taking: Reported on 02/20/2015) 15 tablet 0    Objective: BP 100/61 mmHg  Pulse 67  Temp(Src) 98.2 F (36.8 C) (Oral)  Resp 16  Ht 6\' 1"  (1.854 m)  Wt 161 lb (73.029 kg)  BMI 21.25 kg/m2  SpO2 97% Exam: General: NAD HEENT: PERRL, EOMI. MM are very dry Cardiovascular: RRR, nl s1s2 no mrg 2+ rad pulse bl Respiratory: clear bilaterally, normal effort Abdomen: soft, nontender, nondistended, normal bowel sounds Extremities: trace edema LE b/l Skin: no rashes Neuro: somnolent, opens eyes to verbal stimuli and attempts to answer some questions but is difficult to understand due to mumbling. Follows some commands, grip strength 4/5 bilaterally. Unable to fully test CN.  Labs and Imaging: CBC BMET    Recent Labs Lab 02/27/15 2207  WBC 19.6*  HGB 10.9*  HCT 31.0*  PLT 110*    Recent Labs Lab 02/27/15 2207  NA 125*  K 2.8*  CL 94*  CO2 25  BUN 34*  CREATININE 1.34*  GLUCOSE 109*  CALCIUM 8.1*     Dg Chest Port 1 View  02/27/2015   CLINICAL DATA:  Peripheral edema. History of congestive heart failure.  EXAM: PORTABLE CHEST - 1 VIEW  COMPARISON:  02/23/2015  FINDINGS: There is substantial improvement from 02/23/2015 with near complete clearance of the central and basilar airspace opacities. There are no large effusions. Hilar, mediastinal and cardiac contours are unremarkable and unchanged. Pulmonary vasculature is normal.  IMPRESSION: Near complete resolution of airspace opacities. This may represent resolved alveolar edema.   Electronically Signed   By: Ellery Plunk M.D.   On: 02/27/2015 23:55    Nani Ravens, MD 02/28/2015, 12:19 AM PGY-2, [facility] FPTS Intern pager: [phone], text pages welcome

## 2015-02-28 NOTE — Progress Notes (Signed)
Patient arrived to room 6 from ED via stretcher, transferred from stretcher to bed, SR up, call bell in reach, Bed alarm on. Attempted to do admission paperwork, patient confused to time/place/events so unable to complete. Admission assessment complete, denies pain at this time. Oriented patient to room, call bell, bed controls, patient guide and fall prevention, verbalizes understanding. Will monitor.

## 2015-02-28 NOTE — Progress Notes (Signed)
Family Medicine Teaching Service Daily Progress Note Intern Pager: [phone]  Patient name: [name] Medical record number: [id number] Date of birth: 11-23-1950 Age: 64 y.o. Gender: male  Primary Care Provider: Everlene Other, DO Consultants: none Code Status: Full  Pt Overview and Major Events to Date:  5/8 admitted for confusion  Assessment and Plan: [name] is a 64 y.o. male presenting with AMS. PMH is significant for HFrEF, CAD s/p CABG, COPD, TBI, seizures, PTSD, pancreatitis, HLD.  # Acute toxic metabolic encephalopathy: WBC noted 19.6 up from 15.5 on discharge. Electrolyte abnormalities including hyponatremia, hypokalemia, SCr appears same as recent hospitalization. Concern for occult infection, CXR looks much improved and UA is completely normal, no evidence of cellulitis; also no temperature and does appear to be improving with mentation somewhat in ED so will hold off on abx. No report of toxic ingestion but would remain on DDx as well as postictal state with hx of seizures though no report of seizure-like activity in chart review. WBC significantly decreased 19>12 with fluids, UA unremarkable.  - blood cx and UA - continue home depakote 500mg  TID - consider CT/MRI head if develops focal findings - PT eval  # HFrEF: would favor him being more dehydrated and edema due to low albumin. S/p 500cc bolus in ED. BNP 600, improved from >4500 last week. On clinical exam he has very dry mucous membranes; need to take caution with rehydration as he went into exacerbation requiring lasix from fluid overload for pancreatitis - gentle hydration overnight 50cc/hr - monitor clinically for respiratory distress/pulmonary edema and will initiate lasix if needed - HF team consult tomorrow - Palliative care consult in this difficult to control CHF, worsening disease status.  # Hypokalemia: received 3 runs overnight (ordered for 5, not sure why it was d/c'd early), K+ still 2.9 -  repeat potassium IV x 5 - bmet at 15:00 after runs of K.  # Severe protein calorie malnutrition: albumin 1.8 - continue supplemental ensure shakes when mentation improves  # HLD:  - continue home statin  FEN/GI: NS @ 50cc/hr Prophylaxis: will d/c heparin sq due to plts <100k, likely dilutional decrease. SCDs.  Disposition: pending clinical improvement  Subjective:  Mental status slightly improved this morning, still acting somnolent and requires questions to be asked multiple times and most responses are mumbled/difficult to understand. Denies any pain.  Objective: Temp:  [98.1 F (36.7 C)-98.2 F (36.8 C)] 98.1 F (36.7 C) (05/08 0235) Pulse Rate:  [67-76] 76 (05/08 0235) Resp:  [14-25] 18 (05/08 0235) BP: (84-113)/(53-62) 113/58 mmHg (05/08 0235) SpO2:  [95 %-98 %] 97 % (05/08 0235) Weight:  [161 lb (73.029 kg)-161 lb 9.6 oz (73.3 kg)] 161 lb 9.6 oz (73.3 kg) (05/08 0235) Physical Exam: General: NAD Cardiovascular: RRR, nl s1s2 no mrg Respiratory: CTAB, normal effort Abdomen: soft, NTND, nl bowel sounds Extremities: trace edema LE b/l Neuro: somnolent. Responds to voice but mumbles words. CN grossly normal though difficult to get him to follow all instructions.   Laboratory:  Recent Labs Lab 02/26/15 1020 02/27/15 2207 02/28/15 0549  WBC 15.5* 19.6* 12.6*  HGB 11.4* 10.9* 9.5*  HCT 32.1* 31.0* 26.7*  PLT 110* 110* 71*    Recent Labs Lab 02/22/15 1112  02/26/15 1020 02/27/15 2207 02/28/15 0549  NA 139  < > 132* 125* 131*  K 3.7  < > 3.1* 2.8* 2.9*  CL 109  < > 96* 94* 95*  CO2 22  < >  26 25 24   BUN 19  < > 40* 34* 31*  CREATININE 1.42*  < > 1.41* 1.34* 1.27*  CALCIUM 8.6*  < > 8.3* 8.1* 8.1*  PROT 5.1*  --   --  5.2*  --   BILITOT 0.5  --   --  0.7  --   ALKPHOS 41  --   --  63  --   ALT 8*  --   --  8*  --   AST 15  --   --  21  --   GLUCOSE 81  < > 89 109* 90  < > = values in this interval not displayed.    Imaging/Diagnostic Tests: Dg  Chest Port 1 View  02/27/2015   CLINICAL DATA:  Peripheral edema. History of congestive heart failure.  EXAM: PORTABLE CHEST - 1 VIEW  COMPARISON:  02/23/2015  FINDINGS: There is substantial improvement from 02/23/2015 with near complete clearance of the central and basilar airspace opacities. There are no large effusions. Hilar, mediastinal and cardiac contours are unremarkable and unchanged. Pulmonary vasculature is normal.  IMPRESSION: Near complete resolution of airspace opacities. This may represent resolved alveolar edema.   Electronically Signed   By: Ellery Plunk M.D.   On: 02/27/2015 23:55    Nani Ravens, MD 02/28/2015, 8:44 AM PGY-2, [facility] FPTS Intern pager: [phone], text pages welcome

## 2015-02-28 NOTE — ED Notes (Signed)
Pt's wife states that when patient has a flare up of his Post traumatic syndrome he shuts down and stops eating and caring for himself.

## 2015-03-01 DIAGNOSIS — I509 Heart failure, unspecified: Secondary | ICD-10-CM | POA: Insufficient documentation

## 2015-03-01 LAB — BASIC METABOLIC PANEL
Anion gap: 8 (ref 5–15)
BUN: 24 mg/dL — AB (ref 6–20)
CHLORIDE: 98 mmol/L — AB (ref 101–111)
CO2: 23 mmol/L (ref 22–32)
Calcium: 8 mg/dL — ABNORMAL LOW (ref 8.9–10.3)
Creatinine, Ser: 1.06 mg/dL (ref 0.61–1.24)
GFR calc Af Amer: >60 mL/min (ref >60)
GFR calc non Af Amer: >60 mL/min (ref >60)
Glucose, Bld: 70 mg/dL (ref 70–99)
Potassium: 3.9 mmol/L (ref 3.5–5.1)
Sodium: 129 mmol/L — ABNORMAL LOW (ref 135–145)

## 2015-03-01 LAB — FOLATE: FOLATE: 6 ng/mL (ref >5.9)

## 2015-03-01 LAB — TSH: TSH: 5.888 u[IU]/mL — ABNORMAL HIGH (ref 0.350–4.500)

## 2015-03-01 LAB — VITAMIN B12: Vitamin B-12: 1045 pg/mL — ABNORMAL HIGH (ref 180–914)

## 2015-03-01 MED ORDER — SENNOSIDES-DOCUSATE SODIUM 8.6-50 MG PO TABS
2.0000 | ORAL_TABLET | Freq: Every day | ORAL | Status: DC
  Administered 2015-03-01: 2 via ORAL
  Filled 2015-03-01: qty 2

## 2015-03-01 MED ORDER — MICONAZOLE NITRATE POWD
Freq: Two times a day (BID) | Status: DC
  Administered 2015-03-02: 11:00:00 via TOPICAL

## 2015-03-01 MED ORDER — ACETAMINOPHEN 500 MG PO TABS
500.0000 mg | ORAL_TABLET | Freq: Three times a day (TID) | ORAL | Status: DC
  Administered 2015-03-01 – 2015-03-02 (×4): 500 mg via ORAL
  Filled 2015-03-01 (×5): qty 1

## 2015-03-01 NOTE — Consult Note (Signed)
Consultation Note Date: 03/01/2015   Patient Name: [name]  DOB: [birth year]  MRN: [id number]  Age / Sex: 64 y.o., male   PCP: Austin Spikes, DO Referring Physician: Zenia Resides, MD  Reason for Consultation: Disposition and Establishing goals of care  Palliative Care Assessment and Plan Summary of Established Goals of Care and Medical Treatment Preferences    Palliative Care Discussion Held Today Contacts/Participants in Discussion: Primary Decision Maker: wife  HCPOA: yes  Met with wife and daughter and patient. He is arousable but his wife was the primary historian. She has been caring for him since a fall resulting in TBI in '91. He has been followed by [name] (sic) for neuro-psych at [facility] for TBI. She is very protective about his care. She has many comments about his care including that she feels he was too weak to be discharged last Friday. By her report he was diagnosed with pseudo-siezures but she feels he is in a dark place psychologically which is a major factor in his mental status change. His heart failure had been under good control until his recent hospitalization for pancreatitis. She does have concerns about fluid overload. Also, she is concerned that he is in pain that contributes to his change in personality and level of consciousness.   Discussed his multiple comorbidities including heart failure, ASVD, COPD, TBI. He is usually more functional by her report. He is a candidate for CPR/resuscitation but no mechanical ventilatory support, no HD or other extreme heroic measures. Her preference is for discharge to home but when he is strong enough. Discussed with her the potential need for SNF/rehab once he is medically stable. She also wants to take him to [facility] to see [name] when he is stable. No records available with CareEverywhere.  Code Status/Advance Care Planning:  Limited code - see orders.   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 He was hospitalized 4/30-02/25/14 for acute on chronic pancreatitis with evidence possible pseudocyst. He was treated with antibiotics and did not require surgical intervention. He did have volume overload secondary to resuscitation for pancreatitis that did respond to medical therapy. Per his wife he was very weak at the time of discharge and had swelling of hands and legs. His mental status was changed: much more withdrawn than usual and unable to manager his ADLs. He was subsequently returned to hospital.  Evaluation has not revealed a source of infection. Cardiac status has been stable. He was diagnosed with protein calorie malnutrition at last admission.  Primary Diagnoses  Present on Admission:   HYPERCHOLESTEROLEMIA  Chronic systolic heart failure  Hyponatremia  Protein-calorie malnutrition, severe  Toxic metabolic encephalopathy  Palliative Review of Systems: Mild pain. No constipation. No anxiety. Question of depression vs psychosis. Has supportive family I have reviewed the medical record, interviewed the patient and family, and examined the patient. The following aspects are pertinent.  Past Medical History  Diagnosis Date   Seizure disorder    Nausea    History of heartburn    Chronic back pain    IHD (ischemic heart disease)    Cigarette smoker    Hypercholesterolemia    CHF  (congestive heart failure)     EF had been 20% initially. 2010 EF 35-40%.   Traumatic brain injury    Seizures    COPD (chronic obstructive pulmonary disease)    Pancreatitis    History   Social History   Marital Status: Married    Spouse Name: N/A   Number of Children: N/A   Years of Education: N/A   Social History Main Topics   Smoking status: Current Every Day Smoker -- 0.50 packs/day for 50 years    Types: Cigarettes   Smokeless tobacco: Never Used   Alcohol Use: No   Drug Use: No   Sexual Activity: Not on file   Other Topics Concern   None   Social History Narrative   Family History  Problem Relation Age of Onset   Heart disease Brother    Hypertension Brother    Heart attack Brother    Heart disease Sister    Coronary artery disease Brother    Hypertension Brother    Heart attack Brother    Coronary artery disease Sister    Diabetes Brother    Thyroid disease Brother    Heart attack Brother    Diabetes Father    Heart disease Father    Hypertension Father    Heart attack Father    Hypertension Mother    Varicose Veins Mother    Scheduled Meds:  antiseptic oral rinse  7 mL Mouth Rinse q12n4p   chlorhexidine  15 mL Mouth Rinse BID   divalproex  500 mg Oral TID   feeding supplement (ENSURE ENLIVE)  237 mL Oral TID BM   pravastatin  40 mg Oral Daily   sodium chloride  3 mL Intravenous Q12H   tamsulosin  0.4 mg Oral Daily   Continuous Infusions:  sodium chloride 50 mL/hr at 02/28/15 1953   PRN Meds:.ondansetron **OR** ondansetron (ZOFRAN) IV Medications Prior to Admission:  Prior to Admission medications   Medication Sig Start Date End Date Taking? Authorizing Provider  acetaminophen (TYLENOL) 650 MG CR tablet Take 650 mg by mouth every 8 (eight) hours as needed for pain.   Yes Historical Provider, MD  aspirin 325 MG tablet Take 325 mg by mouth daily.     Yes Historical Provider, MD  carvedilol (COREG) 6.25 MG  tablet Take 1 tablet (6.25 mg total) by mouth 2 (two) times daily. 04/10/14  Yes Minus Breeding, MD  divalproex (DEPAKOTE) 500 MG EC tablet Take 500 mg by mouth 3 (three) times daily. Prescribed by  his neurologist in [facility]   Yes Historical Provider, MD  feeding supplement, ENSURE ENLIVE, (ENSURE ENLIVE) LIQD Take 237 mLs by mouth 3 (three) times daily between meals. 02/26/15  Yes Aquilla Hacker, MD  furosemide (LASIX) 40 MG tablet TAKE ONE TABLET BY MOUTH EVERY OTHER DAY   Yes Minus Breeding, MD  lisinopril (PRINIVIL,ZESTRIL) 5 MG tablet Take 1 tablet (5 mg total) by mouth daily. Patient taking differently: Take 5 mg by mouth at bedtime.  05/14/14  Yes Minus Breeding, MD  pravastatin (PRAVACHOL) 40 MG tablet Take 1 tablet (40 mg total) by mouth daily. 05/14/14  Yes Minus Breeding, MD  ranitidine (ZANTAC) 150 MG capsule Take 300 mg by mouth daily.  Yes Historical Provider, MD  tamsulosin (FLOMAX) 0.4 MG CAPS capsule Take 1 capsule (0.4 mg total) by mouth daily. 02/26/15  Yes Aquilla Hacker, MD  docusate sodium (COLACE) 100 MG capsule Take 1 capsule (100 mg total) by mouth 2 (two) times daily as needed for mild constipation. Patient not taking: Reported on 02/20/2015 03/30/14   Bonnita Hollow, MD  ondansetron (ZOFRAN) 4 MG tablet Take 1 tablet (4 mg total) by mouth every 8 (eight) hours as needed for nausea or vomiting. Patient not taking: Reported on 02/20/2015 11/11/14   Austin Spikes, DO  oxyCODONE-acetaminophen (PERCOCET/ROXICET) 5-325 MG per tablet Take 1 tablet by mouth every 6 (six) hours as needed for severe pain. Patient not taking: Reported on 02/20/2015 03/30/14   Bonnita Hollow, MD   Allergies  Allergen Reactions   Aspirin     To much aspirin make his bleed on the inside can take a small amount.   Morphine And Related Other (See Comments)    "MAKES HIM MEAN"   CBC:    Component Value Date/Time   WBC 12.6* 02/28/2015 0549   HGB 9.5* 02/28/2015 0549   HCT 26.7* 02/28/2015 0549   PLT  71* 02/28/2015 0549   MCV 97.1 02/28/2015 0549   NEUTROABS 16.0* 02/27/2015 2207   LYMPHSABS 1.8 02/27/2015 2207   MONOABS 1.6* 02/27/2015 2207   EOSABS 0.0 02/27/2015 2207   BASOSABS 0.2* 02/27/2015 2207   Comprehensive Metabolic Panel:    Component Value Date/Time   NA 129* 03/01/2015 0449   K 3.9 03/01/2015 0449   CL 98* 03/01/2015 0449   CO2 23 03/01/2015 0449   BUN 24* 03/01/2015 0449   CREATININE 1.06 03/01/2015 0449   CREATININE 1.51* 01/01/2015 1449   GLUCOSE 70 03/01/2015 0449   CALCIUM 8.0* 03/01/2015 0449   AST 21 02/27/2015 2207   ALT 8* 02/27/2015 2207   ALKPHOS 63 02/27/2015 2207   BILITOT 0.7 02/27/2015 2207   PROT 5.2* 02/27/2015 2207   ALBUMIN 1.8* 02/27/2015 2207    Physical Exam: Vital Signs: BP 118/59 mmHg   Pulse 72   Temp(Src) 98.8 F (37.1 C) (Oral)   Resp 18   Ht _0  (1.981 m)   Wt 73.3 kg (161 lb 9.6 oz)   BMI 18.68 kg/m2   SpO2 96% SpO2: SpO2: 96 % O2 Device: O2 Device: Not Delivered O2 Flow Rate:   Intake/output summary:  Intake/Output Summary (Last 24 hours) at 03/01/15 1523 Last data filed at 03/01/15 1423  Gross per 24 hour  Intake   1420 ml  Output    625 ml  Net    795 ml   LBM:   Baseline Weight: Weight: 73.029 kg (161 lb) Most recent weight: Weight: 73.3 kg (161 lb 9.6 oz)  Exam Findings:  Gen'l - white male who is arousable and able to answer simple question HEENT - C&S clear Cor - 2+ radial and DP pulses, no JVD, trace peripheral edema. RRR Pulm - (limited exam) no increased WOB, no rales or wheezes. Abd - hypoactive BS, no guarding or rebound tenderness Genitalia - red scrotum and glans penis Ext - no deformity noted Derm - small area of irritation left of mid-line at sacrum w/o ulceration or frank skin breakdown. Erythema of scrotum and penis Neuro - does follow simple commands, opens eye to verbal stimulus. Has soft but clear speech - when asked if he was in pain.         Palliative Performance Scale: 50%  Additional Data Reviewed: Recent Labs     02/27/15  2207  02/28/15  0549  02/28/15  1520  03/01/15  0449  WBC  19.6*  12.6*   --    --   HGB  10.9*  9.5*   --    --   PLT  110*  71*   --    --   NA  125*  131*  131*  129*  BUN  34*  31*  26*  24*  CREATININE  1.34*  1.27*  1.05  1.06   BMP Latest Ref Rng 03/01/2015 02/28/2015 02/28/2015  Glucose 70 - 99 mg/dL 70 80 90  BUN 6 - 20 mg/dL 24(H) 26(H) 31(H)  Creatinine 0.61 - 1.24 mg/dL 1.06 1.05 1.27(H)  Sodium 135 - 145 mmol/L 129(L) 131(L) 131(L)  Potassium 3.5 - 5.1 mmol/L 3.9 3.4(L) 2.9(L)  Chloride 101 - 111 mmol/L 98(L) 95(L) 95(L)  CO2 22 - 32 mmol/L _0 Calcium 8.9 - 10.3 mg/dL 8.0(L) 8.2(L) 8.1(L)     B12 1045; lipase 84, TSH 5.888  CXR 02/27/15 - IMPRESSION: Near complete resolution of airspace opacities. This may represent resolved alveolar edema.  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 Recommend followup by abdomen and pelvis CTA in 3-6 months, and vascular surgery referral/consultation if not already obtained.   A/p 1. AMS - encephalopathy of unknown origin, possibly neuro-psych. No focal findings Plan Scheduled APAP to manage mild pain             For failure to improve neuro-psych eval  2. Cardiac - no evidence of active failure with improved 2 D echo as of May 1. Plan Continue current medication  3. COPD - not quantified in current data. He does continue to smoke.  4. TBI - long standing problem. Has been followed in [facility] - will try to get [facility] patient number and records. Per his wife he has been highly functional  5. Malnutrition - he does appear emaciated. Plan Continue supplements  6. ACP - see above. Limited code status. Goal is for home with home health unless he does not progress in which case he may need SNF  Time In: 1523 Time Out: 1736 Time Total: 136 min  Greater than 50%  of this time was spent counseling and coordinating care related  to the above assessment and plan.  Signed by: Neena Rhymes  [No Provider Link Found]  03/01/2015, 3:23 PM  Please contact Palliative Medicine Team phone at [phone] for questions and concerns.

## 2015-03-01 NOTE — Progress Notes (Signed)
PT Cancellation Note  Patient Details Name: Austin LewandowskyRobert C Jock Rivera MRN: [id number] DOB: Jun 09, 1951   Cancelled Treatment:    Reason Eval/Treat Not Completed: Other (comment).  Attempted to see patient x2 today.  Bedrest orders active in am.  MD removed bedrest and added activity orders.  Thank you! This pm, patient with Palliative Care provider.   Will return at a later time for PT evaluation.   Vena AustriaDavis, Charitie Hinote H 03/01/2015, 4:48 PM Durenda HurtSusan H. Renaldo Fiddleravis, PT, [facility] Acute Rehab Services Pager [phone]

## 2015-03-01 NOTE — Discharge Summary (Signed)
Family Medicine Teaching [facility] Discharge Summary  Patient name: [name] Medical record number: [id number] Date of birth: [birth year] Age: 64 y.o. Gender: male Date of Admission: 02/27/2015  Date of Discharge: 03/02/2015 Admitting Physician: Moses Manners, MD  Primary Care Provider: Everlene Other, DO Consultants: None  Indication for Hospitalization: Altered Mental Status  Discharge Diagnoses/Problem List:  Altered Mental Status, HFrEF, CAD s/p CABG, COPD, TBI, Seizures, PTSD, chronic pancreatitis, HLD  Disposition: Home with home health PT  Discharge Condition: Improved  Discharge Exam:  Blood pressure 104/56, pulse 74, temperature 97.9 F (36.6 C), temperature source Oral, resp. rate 18, height 6\' 6"  (1.981 m), weight 161 lb 9.6 oz (73.3 kg), SpO2 97 %. Gen: elderly man in NAD lying in hospital bed eating breakfast CV: RRR, no murmurs appreciated Pulm: NWOB, CTAB, no wheezes or crackles Abd: +BS, S, NT, ND Extremities: Trace edema to ankles bilaterally Neuro: Alert, but delayed. Responds to commands. Communicative, however mumbles words. No focal neurological deficits.   Brief Hospital Course:  [name] is a 64 year old man with PMH of HFrEF, CAD s/ CABG, COPD, TBI, seizures, PTSD, chronic pancreatitis, and HLD who presented with weakness and confusion from home 1 day after being discharged from the hospital. His hospital course by problem is outlined below:  Confusion/Weakness. The etiology for his presentation was unclear. His CXR and UA showed no signs of infection and the patient had normal WBC and no fevers. He had no seizure like activity. B12 and folate were unremarkable, and TSH was only slightly elevated. He did initially have electrolyte abnormalities including hyponatremia and hypokalemia which improved back to baseline by the time of discharge. Patient had no focal findings, so imaging was deferred. His mental status gradually improved during the  stay. We consulted PT due to his persistent weakness. Given his diffuse weakness, the medical team recommended SNF placement, however the patient's wife strongly wished to take the patient home given his history of TBI and PTSD. We arranged for him to have home health PT as an outpatient  Hypertension: Patient was noted to be normotensive on admission and we held his home coreg, lisinopril, and lasix. Patient continued to be mildly hypotensive to normotensive without antihypertensives. We restarted his lisinopril at a dose of 2.5mg  daily at the time of discharge.   The patient's other chronic medical conditions were stable during this admission and the patient was continued on his home medications  Issues for Follow Up:  1. F/u weakness - recommended SNF placement, however patient's wife elected to go home with home health PT 2. F/u BP - decreased lisinopril to 2.5mg  and stopped coreg at discharge due to mild hypotension. Would start back as indicated 3. Consider changing depakote to other antiepileptic agent given associated with pancreatitis.  4. Follow up final blood culture results   Significant Procedures: None  Significant Labs and Imaging:   Recent Labs Lab 02/26/15 1020 02/27/15 2207 02/28/15 0549  WBC 15.5* 19.6* 12.6*  HGB 11.4* 10.9* 9.5*  HCT 32.1* 31.0* 26.7*  PLT 110* 110* 71*    Recent Labs Lab 02/27/15 2207 02/28/15 0549 02/28/15 1520 03/01/15 0449 03/02/15 0528  NA 125* 131* 131* 129* 133*  K 2.8* 2.9* 3.4* 3.9 3.5  CL 94* 95* 95* 98* 101  CO2 25 24 26 23 23   GLUCOSE 109* 90 80 70 68*  BUN 34* 31* 26* 24* 20  CREATININE 1.34* 1.27* 1.05 1.06 1.03  CALCIUM 8.1* 8.1* 8.2*  8.0* 8.2*  ALKPHOS 63  --   --   --   --   AST 21  --   --   --   --   ALT 8*  --   --   --   --   ALBUMIN 1.8*  --   --   --   --     Urinalysis    Component Value Date/Time   COLORURINE YELLOW 02/28/2015 0149   APPEARANCEUR CLEAR 02/28/2015 0149   LABSPEC 1.013 02/28/2015 0149    PHURINE 5.0 02/28/2015 0149   GLUCOSEU NEGATIVE 02/28/2015 0149   HGBUR NEGATIVE 02/28/2015 0149   BILIRUBINUR NEGATIVE 02/28/2015 0149   KETONESUR NEGATIVE 02/28/2015 0149   PROTEINUR NEGATIVE 02/28/2015 0149   UROBILINOGEN 1.0 02/28/2015 0149   NITRITE NEGATIVE 02/28/2015 0149   LEUKOCYTESUR NEGATIVE 02/28/2015 0149   TSH 5.888 Folate 6.0 B12 1045  EKG: NSR, no ischemic changes  Results/Tests Pending at Time of Discharge: Blood culture  Discharge Medications:    Medication List    STOP taking these medications        carvedilol 6.25 MG tablet  Commonly known as:  COREG      TAKE these medications        acetaminophen 650 MG CR tablet  Commonly known as:  TYLENOL  Take 650 mg by mouth every 8 (eight) hours as needed for pain.     aspirin 325 MG tablet  Take 325 mg by mouth daily.     divalproex 500 MG DR tablet  Commonly known as:  DEPAKOTE  Take 500 mg by mouth 3 (three) times daily. Prescribed by  his neurologist in [facility]     docusate sodium 100 MG capsule  Commonly known as:  COLACE  Take 1 capsule (100 mg total) by mouth 2 (two) times daily as needed for mild constipation.     feeding supplement (ENSURE ENLIVE) Liqd  Take 237 mLs by mouth 3 (three) times daily between meals.     furosemide 40 MG tablet  Commonly known as:  LASIX  TAKE ONE TABLET BY MOUTH EVERY OTHER DAY     lisinopril 2.5 MG tablet  Commonly known as:  PRINIVIL,ZESTRIL  Take 1 tablet (2.5 mg total) by mouth daily.     ondansetron 4 MG tablet  Commonly known as:  ZOFRAN  Take 1 tablet (4 mg total) by mouth every 8 (eight) hours as needed for nausea or vomiting.     oxyCODONE-acetaminophen 5-325 MG per tablet  Commonly known as:  PERCOCET/ROXICET  Take 1 tablet by mouth every 6 (six) hours as needed for severe pain.     pravastatin 40 MG tablet  Commonly known as:  PRAVACHOL  Take 1 tablet (40 mg total) by mouth daily.     ranitidine 150 MG capsule  Commonly known as:   ZANTAC  Take 300 mg by mouth daily.     tamsulosin 0.4 MG Caps capsule  Commonly known as:  FLOMAX  Take 1 capsule (0.4 mg total) by mouth daily.        Discharge Instructions: Please refer to Patient Instructions section of EMR for full details.  Patient was counseled important signs and symptoms that should prompt return to medical care, changes in medications, dietary instructions, activity restrictions, and follow up appointments.   Follow-Up Appointments: Follow-up Information    Follow up with Everlene Other, DO On 03/04/2015.   Specialty:  Family Medicine   Why:  10:30am   Contact  information:   [address] [place] [address] [phone]       Ardith Dark, MD 03/02/2015, 2:41 PM PGY-1, [facility] Family Medicine

## 2015-03-01 NOTE — Progress Notes (Signed)
Utilization Review completed. Draper Gallon RN BSN CM 

## 2015-03-01 NOTE — Progress Notes (Signed)
Family Medicine Teaching Service Daily Progress Note Intern Pager: [phone]  Patient name: [name] Medical record number: [id number] Date of birth: 1950-11-07 Age: 64 y.o. Gender: male  Primary Care Provider: Everlene Otherook, Jayce, DO Consultants: none Code Status: Full  Pt Overview and Major Events to Date:  5/8 admitted for confusion  Assessment and Plan: [name] is a 64 y.o. male presenting with AMS. PMH is significant for HFrEF, CAD s/p CABG, COPD, TBI, seizures, PTSD, pancreatitis, HLD.  # Acute toxic metabolic encephalopathy: Concern for occult infection, though CXR looks much improved and UA is completely normal, no evidence of cellulitis; also no temperature and does appear to be improving with mentation so will hold off on abx. Electrolyte abnormalities including hyponatremia, hypokalemia, SCr appears same as recent hospitalization. No report of toxic ingestion but would remain on DDx as well as postictal state with hx of seizures though no report of seizure-like activity in chart review.  - blood cx pending - continue home depakote 500mg  TID - consider CT/MRI head if develops focal findings - PT eval - Palliative care consulted, appreciate assistance - f/u TSH, B12, folate  # HFrEF:  On clinical exam he has very dry mucous membranes; need to take caution with rehydration as he went into exacerbation requiring lasix from fluid overload for pancreatitis. Echo on 5/1 with EF 40-45% and grade 1 diastolic dysfunction - gentle hydration overnight 50cc/hr - monitor clinically for respiratory distress/pulmonary edema and will initiate lasix if needed - Consider HF team consult - Palliative care consult in this difficult to control CHF, worsening disease status.  # Hypokalemia: Improving.  - Continue to monitor - Replete prn  # Severe protein calorie malnutrition: albumin 1.8 - continue supplemental ensure shakes when mentation improves  # HLD:  - continue home  statin  FEN/GI: NS @ 50cc/hr Prophylaxis: will d/c heparin sq due to plts <100k, likely dilutional decrease. SCDs.  Disposition: Admitted to floor pending above management. Dispo pending OT/PT recs.   Subjective:  Feels cold this morning. No other complaints.   Objective: Temp:  [98.2 F (36.8 C)-99.5 F (37.5 C)] 99.5 F (37.5 C) (05/09 0535) Pulse Rate:  [72-74] 72 (05/09 0535) Resp:  [16-18] 16 (05/09 0535) BP: (118-125)/(58-67) 120/63 mmHg (05/09 0535) SpO2:  [97 %-98 %] 98 % (05/09 0535) Physical Exam: General: NAD, lethargic Cardiovascular: RRR, nl s1s2 no mrg Respiratory: CTAB, normal effort Abdomen: soft, NTND, nl bowel sounds Extremities: trace edema LE b/l Neuro: somnolent. Responds to voice but mumbles words. CN grossly normal though difficult to get him to follow all instructions.   Laboratory/Imaging:  Recent Labs Lab 02/26/15 1020 02/27/15 2207 02/28/15 0549  WBC 15.5* 19.6* 12.6*  HGB 11.4* 10.9* 9.5*  HCT 32.1* 31.0* 26.7*  PLT 110* 110* 71*    Recent Labs Lab 02/22/15 1112  02/27/15 2207 02/28/15 0549 02/28/15 1520 03/01/15 0449  NA 139  < > 125* 131* 131* 129*  K 3.7  < > 2.8* 2.9* 3.4* 3.9  CL 109  < > 94* 95* 95* 98*  CO2 22  < > 25 24 26 23   BUN 19  < > 34* 31* 26* 24*  CREATININE 1.42*  < > 1.34* 1.27* 1.05 1.06  CALCIUM 8.6*  < > 8.1* 8.1* 8.2* 8.0*  PROT 5.1*  --  5.2*  --   --   --   BILITOT 0.5  --  0.7  --   --   --   Jolly MangoALKPHOS  41  --  63  --   --   --   ALT 8*  --  8*  --   --   --   AST 15  --  21  --   --   --   GLUCOSE 81  < > 109* 90 80 70  < > = values in this interval not displayed.   Ardith Darkaleb M Parker, MD 03/01/2015, 8:26 AM PGY-1, [facility] FPTS Intern pager: [phone], text pages welcome

## 2015-03-02 LAB — BASIC METABOLIC PANEL
ANION GAP: 9 (ref 5–15)
BUN: 20 mg/dL (ref 6–20)
CALCIUM: 8.2 mg/dL — AB (ref 8.9–10.3)
CO2: 23 mmol/L (ref 22–32)
Chloride: 101 mmol/L (ref 101–111)
Creatinine, Ser: 1.03 mg/dL (ref 0.61–1.24)
GFR calc Af Amer: >60 mL/min (ref >60)
GFR calc non Af Amer: >60 mL/min (ref >60)
GLUCOSE: 68 mg/dL — AB (ref 70–99)
POTASSIUM: 3.5 mmol/L (ref 3.5–5.1)
SODIUM: 133 mmol/L — AB (ref 135–145)

## 2015-03-02 MED ORDER — LISINOPRIL 2.5 MG PO TABS: 2.5000 mg | ORAL_TABLET | Freq: Every day | ORAL | Status: DC

## 2015-03-02 NOTE — Progress Notes (Signed)
Daily Progress Note   Patient Name: Austin GamblesRobert C Catalfamo Rivera       Date: 03/02/2015 DOB: 22-Nov-1950  Age: 64 y.o. MRN#: [id number] Attending Physician: Moses MannersWilliam A Hensel, MD Primary Care Physician: Everlene Otherook, Jayce, DO Admit Date: 02/27/2015  Reason for Consultation/Follow-up: Establishing goals of care and Psychosocial/spiritual support  Subjective: Sitting up in a chair eating breakfast. More awake and alert. Reports minimal to no pain Interval Events: No events in the interval and no change in condition.  Length of Stay: 2 days  Current Medications: Scheduled Meds:   acetaminophen  500 mg Oral TID   antiseptic oral rinse  7 mL Mouth Rinse q12n4p   chlorhexidine  15 mL Mouth Rinse BID   divalproex  500 mg Oral TID   feeding supplement (ENSURE ENLIVE)  237 mL Oral TID BM   miconazole nitrate   Topical BID   pravastatin  40 mg Oral Daily   senna-docusate  2 tablet Oral QHS   sodium chloride  3 mL Intravenous Q12H   tamsulosin  0.4 mg Oral Daily    Continuous Infusions:  sodium chloride 50 mL/hr at 03/01/15 1525    PRN Meds: ondansetron **OR** ondansetron (ZOFRAN) IV  Palliative Performance Scale: 60%     Vital Signs: BP 124/62 mmHg   Pulse 68   Temp(Src) 97.5 F (36.4 C) (Oral)   Resp 18   Ht 6\' 6"  (1.981 m)   Wt 73.3 kg (161 lb 9.6 oz)   BMI 18.68 kg/m2   SpO2 97% SpO2: SpO2: 97 % O2 Device: O2 Device: Not Delivered O2 Flow Rate:    Intake/output summary:  Intake/Output Summary (Last 24 hours) at 03/02/15 1004 Last data filed at 03/02/15 0958  Gross per 24 hour  Intake 966.67 ml  Output    400 ml  Net 566.67 ml   LBM:   Baseline Weight: Weight: 73.029 kg (161 lb) Most recent weight: Weight: 73.3 kg (161 lb 9.6 oz)  Physical Exam: Gen'l - haggard appearing white man in no distress, eating breakfast HEENT- C&S clear, missing some teeth. No frank oral lesions Cor - 2+ radial and DP pulses, 1+ pedal edema w/o extenions to ankle, RRR Pulm - normal respirations,  no rales or wheezes, Abd - BS+, no guarding or rebound, no tenderness to epigastric palpation Ext - no deformity Derm - not re-examined Neuro - more awake, follows commands, speech intelligible. (daughter reports approaching baseline)             Additional Data Reviewed: Recent Labs     02/27/15  2207  02/28/15  0549   03/01/15  0449  03/02/15  0528  WBC  19.6*  12.6*   --    --    --   HGB  10.9*  9.5*   --    --    --   PLT  110*  71*   --    --    --   NA  125*  131*   < >  129*  133*  BUN  34*  31*   < >  24*  20  CREATININE  1.34*  1.27*   < >  1.06  1.03   < > = values in this interval not displayed.   BMP Latest Ref Rng 03/02/2015 03/01/2015 02/28/2015  Glucose 70 - 99 mg/dL 40(J68(L) 70 80  BUN 6 - 20 mg/dL 20 81(X24(H) 91(Y26(H)  Creatinine 0.61 - 1.24 mg/dL 7.821.03 9.561.06 2.131.05  Sodium 135 -  145 mmol/L 133(L) 129(L) 131(L)  Potassium 3.5 - 5.1 mmol/L 3.5 3.9 3.4(L)  Chloride 101 - 111 mmol/L 101 98(L) 95(L)  CO2 22 - 32 mmol/L 23 23 26   Calcium 8.9 - 10.3 mg/dL 8.2(L) 8.0(L) 8.2(L)   bld cx x 2 pending   Problem List:  Patient Active Problem List   Diagnosis Date Noted   CHF exacerbation    Weakness 02/28/2015   Toxic metabolic encephalopathy 02/28/2015   Edema, peripheral    Protein-calorie malnutrition, severe 02/24/2015   Leukocytosis    Acute renal failure syndrome    Hyponatremia    Post traumatic stress disorder    Traumatic brain injury    Chronic systolic congestive heart failure    Acute pancreatitis 02/20/2015   Sinusitis 02/18/2015   Abdominal pain 12/03/2014   AAA (abdominal aortic aneurysm) 11/11/2014   S/P CABG (coronary artery bypass graft) 11/10/2014   Orthostatic hypotension 04/10/2014   Chronic systolic heart failure 08/21/2007   HYPERCHOLESTEROLEMIA 12/20/2006   ERECTILE DYSFUNCTION 12/20/2006   TOBACCO DEPENDENCE 12/20/2006   CORONARY, ARTERIOSCLEROSIS 12/20/2006   GASTROESOPHAGEAL REFLUX, NO ESOPHAGITIS 12/20/2006    CONVULSIONS, SEIZURES, NOS 12/20/2006     Palliative Care Assessment & Plan    Code Status:  Limited code  Goals of Care:  Home with outpatient rehab for PT/OT  Desire for further Chaplaincy support:no  3. Symptom Management:  No active problems. Scheduled APAP has had positive effect on minor to moderate pain  4. Palliative Prophylaxis:  Stool Softner: yes  5. Prognosis: Unable to determine  5. Discharge Planning: home with transport to outpatient rehab 3-5 days a week   A/P 1. AMS - improved. No underlying etiology to date.  Plan  No further work up  [facility]  2. Cardiac - stable. No evidence of active CHF  3. COPD - no respiratory problems Plan Consider outpatient PFTs  Gently encourage smoking cessation  4. TBI - stable and chronic  5. Malnutrition - low protein stores Plan Home made blender protein drinks for taste and cost reasons. Discussed with daughter  676. ACP - per wife there is a living will type document. Will need to be memorialized and reviewed at outpatient follow-up.     Care plan was discussed with patient, daughter and by telephone wife.   Thank you for allowing the Palliative Medicine Team to assist in the care of this patient.   Time In: 1004 Time Out: 1034 Total Time 30 Prolonged Time Billed  no     Greater than 50%  of this time was spent counseling and coordinating care related to the above assessment and plan.   [facility][No Provider Link Found]  03/02/2015, 10:04 AM  Please contact Palliative Medicine Team phone at [phone] for questions and concerns.

## 2015-03-02 NOTE — Discharge Instructions (Signed)
You were admitted to the hospital with confusion and weakness. While here we gave you fluids. You also met with our physical therapist. You will be having physical therapy once you leave the hospital.  Weakness Weakness is a lack of strength. You may feel weak all over your body or just in one part of your body. Weakness can be serious. In some cases, you may need more medical tests. HOME [place] a well-balanced diet.  Try to exercise every day.  Only take medicines as told by your doctor. GET HELP RIGHT AWAY IF:   You cannot do your normal daily activities.  You cannot walk up and down stairs, or you feel very tired when you do so.  You have shortness of breath or chest pain.  You have trouble moving parts of your body.  You have weakness in only one body part or on only one side of the body.  You have a fever.  You have trouble speaking or swallowing.  You cannot control when you pee (urinate) or poop (bowel movement).  You have black or bloody throw up (vomit) or poop.  Your weakness gets worse or spreads to other body parts.  You have new aches or pains. MAKE SURE YOU:   Understand these instructions.  Will watch your condition.  Will get help right away if you are not doing well or get worse. Document Released: 09/21/2008 Document Revised: 04/09/2012 Document Reviewed: 12/08/2011 [facility] Patient Information 2015 [place], [place]. This information is not intended to replace advice given to you by your health care provider. Make sure you discuss any questions you have with your health care provider.

## 2015-03-02 NOTE — Progress Notes (Signed)
NURSING PROGRESS NOTE  Nestor LewandowskyRobert C Croke II [id number] Discharge Data: 03/02/2015 4:43 PM Attending Provider: Moses MannersWilliam A Hensel, MD WUJ:WJXBPCP:Cook, Dorie RankJayce, DO   Westley Gamblesobert C Umholtz II to be D/C'd Home per MD order with wife. D/C instructions gone over with wife present at bedside. Prescription sent to pharmacy.    All IV's will be discontinued and monitored for bleeding.  All belongings will be returned to patient for patient to take home.  Last Documented Vital Signs:  Blood pressure 104/56, pulse 74, temperature 97.9 F (36.6 C), temperature source Oral, resp. rate 17, height 6\' 6"  (1.981 m), weight 73.3 kg (161 lb 9.6 oz), SpO2 97 %.  Leane PlattSpencer Nicholas Ossa RN, BS, BSN

## 2015-03-02 NOTE — Progress Notes (Signed)
MD Doroteo GlassmanPhelps paged, patient went into Ventricular Tachycardia for 5 beats, currently NSR, MD Doroteo GlassmanPhelps ok with discharge (per phone with read back).

## 2015-03-02 NOTE — Progress Notes (Signed)
[facility] [place] [facility]

## 2015-03-02 NOTE — Care Management Note (Signed)
Case Management Note  Patient Details  Name: [name] MRN: [id number] Date of Birth: 02/14/1951  Subjective/Objective:                    Action/Plan:   home with home health Expected Discharge Date:  03/02/15               Expected Discharge Plan:  Home w Home Health Services  In-House Referral:  Clinical Social Work  Discharge planning Services  CM Consult  Post Acute Care Choice:  Home Health Choice offered to:  Patient  DME Arranged:    DME Agency:  [facility]  [facility]  [name]  Status of Service:  Completed, signed off  Medicare Important Message Given:  N/A - LOS <3 / Initial given by admissions Date Medicare IM Given:    Medicare IM give by:    Date Additional Medicare IM Given:    Additional Medicare Important Message give by:     If discussed at Long Length of Stay Meetings, dates discussed:    Additional Comments:  Lawerance SabalDebbie Jillayne Witte, RN 03/02/2015, 3:18 PM

## 2015-03-02 NOTE — Evaluation (Signed)
Physical Therapy Evaluation Patient Details Name: Austin LewandowskyRobert C Ventrella II MRN: [id number] DOB: June 14, 1951 Today's Date: 03/02/2015   History of Present Illness  64 y.o. male presenting with weakness and question acute CHF following resucitation from pancreatitis with recent admission 4/30-5/6. PMH is significant for systolic HF EF 40%, CAD s/p CABG x 6, COPD, TBI, seizures, PTSD.  Clinical Impression  Patient presents with decreased independence with mobility due to deficits listed in PT problem list below.  He will benefit from skilled PT in the acute setting to allow return home with wife assist.  She prefers outpatient PT and [facility].    Follow Up Recommendations Outpatient PT;Supervision/Assistance - 24 hour (lives very close to outpatient neurorehab)    Equipment Recommendations  None recommended by PT    Recommendations for Other Services       Precautions / Restrictions Precautions Precautions: Fall Restrictions Weight Bearing Restrictions: No      Mobility  Bed Mobility Overal bed mobility: Needs Assistance Bed Mobility: Supine to Sit     Supine to sit: HOB elevated;Min assist     General bed mobility comments: assist to lift trunk, slow to respond to directions to move feet off bed  Transfers Overall transfer level: Needs assistance Equipment used: Rolling walker (2 wheeled) Transfers: Sit to/from [employer]Stand;Stand Pivot Transfers Sit to Stand: Min assist Stand pivot transfers: Min assist       General transfer comment: assist to stand from bed, cues and increased time for hand placement; assist for balance to pivot to chair after standing for a moment; did not respond to question if felt like trying to walk some.  Ambulation/Gait                Stairs            Wheelchair Mobility    Modified Rankin (Stroke Patients Only)       Balance Overall balance assessment: Needs assistance   Sitting balance-Leahy Scale:  Good Sitting balance - Comments: but sits with posterior pelvis and rounded spine; forward head   Standing balance support: Bilateral upper extremity supported Standing balance-Leahy Scale: Poor Standing balance comment: requires UE support for balance; stood with supervision with walker flexed posture x 60 sec while getting cleaned due to urinary incontinence in bed                             Pertinent Vitals/Pain Pain Assessment: Faces Faces Pain Scale: Hurts a little bit Pain Location: chin and neck from hiccups this morning Pain Descriptors / Indicators: Discomfort Pain Intervention(s): Monitored during session    Home Living Family/patient expects to be discharged to:: Private residence Living Arrangements: Spouse/significant other Available Help at Discharge: Family;Available 24 hours/day Type of Home: House Home Access: Stairs to enter Entrance Stairs-Rails: Right Entrance Stairs-Number of Steps: 2-3 Home Layout: One level Home Equipment: Walker - 2 wheels      Prior Function Level of Independence: Independent               Hand Dominance        Extremity/Trunk Assessment   Upper Extremity Assessment: Generalized weakness           Lower Extremity Assessment: Generalized weakness (possibly weakner right LE than left with lifting off bed)      Cervical / Trunk Assessment: Kyphotic;Other exceptions  Communication   Communication: Expressive difficulties (slow speech mildly dysartrhic)  Cognition Arousal/Alertness: Lethargic  Behavior During Therapy: Flat affect Overall Cognitive Status: No family/caregiver present to determine baseline cognitive functioning Area of Impairment: Following commands;Problem solving       Following Commands: Follows one step commands with increased time     Problem Solving: Slow processing;Decreased initiation;Requires verbal cues;Requires tactile cues      General Comments      Exercises         Assessment/Plan    PT Assessment Patient needs continued PT services  PT Diagnosis Generalized weakness;Abnormality of gait   PT Problem List Decreased strength;Decreased balance;Decreased mobility;Decreased safety awareness  PT Treatment Interventions DME instruction;Balance training;Gait training;Stair training;Functional mobility training;Patient/family education;Therapeutic activities;Therapeutic exercise   PT Goals (Current goals can be found in the Care Plan section) Acute Rehab PT Goals Patient Stated Goal: To get pt on even keel out of his "dark place" back to independent PT Goal Formulation: With family Time For Goal Achievement: 03/16/15 Potential to Achieve Goals: Good    Frequency Min 3X/week   Barriers to discharge        Co-evaluation               End of Session Equipment Utilized During Treatment: Gait belt Activity Tolerance: Patient limited by lethargy Patient left: in chair;with call bell/phone within reach;with chair alarm set           Time: 0912-0952 PT Time Calculation (min) (ACUTE ONLY): 40 min   Charges:   PT Evaluation $Initial PT Evaluation Tier I: 1 Procedure PT Treatments $Therapeutic Activity: 8-22 mins $Self Care/Home Management: 8-22   PT G Codes:        WYNN,CYNDI 03/02/2015, 10:20 AM  Sheran Lawlessyndi Wynn, PT [phone] 03/02/2015

## 2015-03-02 NOTE — Progress Notes (Signed)
Patient wife notified via phone of patient discharge, wife stated she would be here to pick patient up at 1600.

## 2015-03-02 NOTE — Progress Notes (Signed)
Family Medicine Teaching Service Daily Progress Note Intern Pager: [phone]  Patient name: Austin GamblesRobert C Hepburn II Medical record number: [id number] Date of birth: [birth year] Age: 64 y.o. Gender: male  Primary Care Provider: Everlene Otherook, Jayce, DO Consultants: none Code Status: Full  Pt Overview and Major Events to Date:  5/8 admitted for confusion  Assessment and Plan: Austin GamblesRobert C Miske II is a 64 y.o. male presenting with AMS. PMH is significant for HFrEF, CAD s/p CABG, COPD, TBI, seizures, PTSD, pancreatitis, HLD.  # Acute toxic metabolic encephalopathy: Concern for occult infection, though CXR appears improved and UA is unremarkable, no evidence of cellulitis, and no fevers. Electrolyte abnormalities including hyponatremia, hypokalemia, SCr appears same as recent hospitalization. No report of toxic ingestion but would remain on DDx as well as postictal state with hx of seizures though no report of seizure-like activity in chart review. TSH, B12, folate unremarkable.  - blood cx pending - continue home depakote 500mg  TID - consider CT/MRI head if develops focal findings - PT eval pending - Palliative care consulted, appreciate assistance  # HFrEF: Echo on 5/1 with EF 40-45% and grade 1 diastolic dysfunction. Currently - gentle hydration 50cc/hr - monitor clinically for respiratory distress/pulmonary edema and will initiate lasix if needed - Consider HF team consult  # Severe protein calorie malnutrition: albumin 1.8 - Supplemental ensure  # HLD:  - continue home statin  FEN/GI: NS @ 50cc/hr, heart healthy diet Prophylaxis: will d/c heparin sq due to plts <100k, likely dilutional decrease. SCDs.  Disposition: Admitted to floor pending above management. Dispo pending OT/PT recs.   Subjective:  Wants to eat solid food this morning. No complaints. Agrees to possible SNF placement.   Objective: Temp:  [97.5 F (36.4 C)-98.8 F (37.1 C)] 97.5 F (36.4 C) (05/10 0606) Pulse Rate:  [62-72] 68  (05/10 0606) Resp:  [18-20] 18 (05/10 0606) BP: (101-124)/(59-70) 124/62 mmHg (05/10 0606) SpO2:  [96 %-97 %] 97 % (05/10 0606) Physical Exam: General: NAD, lethargic Cardiovascular: RRR, nl s1s2 no mrg Respiratory: CTAB, normal effort Abdomen: soft, NTND, nl bowel sounds Extremities: trace edema LE b/l Neuro: somnolent. Responds to voice but mumbles words. CN grossly normal though difficult to get him to follow all instructions.   Laboratory/Imaging:  Recent Labs Lab 02/26/15 1020 02/27/15 2207 02/28/15 0549  WBC 15.5* 19.6* 12.6*  HGB 11.4* 10.9* 9.5*  HCT 32.1* 31.0* 26.7*  PLT 110* 110* 71*    Recent Labs Lab 02/27/15 2207  02/28/15 1520 03/01/15 0449 03/02/15 0528  NA 125*  < > 131* 129* 133*  K 2.8*  < > 3.4* 3.9 3.5  CL 94*  < > 95* 98* 101  CO2 25  < > 26 23 23   BUN 34*  < > 26* 24* 20  CREATININE 1.34*  < > 1.05 1.06 1.03  CALCIUM 8.1*  < > 8.2* 8.0* 8.2*  PROT 5.2*  --   --   --   --   BILITOT 0.7  --   --   --   --   ALKPHOS 63  --   --   --   --   ALT 8*  --   --   --   --   AST 21  --   --   --   --   GLUCOSE 109*  < > 80 70 68*  < > = values in this interval not displayed.  TSH 5.888 B12 1045 Folate 6.0   Ardith Darkaleb M Parker, MD  03/02/2015, 8:01 AM PGY-1, [facility] Family Medicine FPTS Intern pager: [phone], text pages welcome

## 2015-03-03 NOTE — Progress Notes (Signed)
Attempted to contact [name] at home phone number, left message for pt to call back so CM could set up OP PT. HH PT arranged per MD at time of discharge.  [facility] stated he may be more interested in OP PT than HH PT.

## 2015-03-03 NOTE — Progress Notes (Signed)
Spoke with wife, preference is to have OP PT due to dog in house and wife wants to get pt out of house and more active. [facility] notified [facility], [facility], and pt's wife [facility] and instructed to call Friday to schedule appointment if she does not hear from them.

## 2015-03-04 ENCOUNTER — Inpatient Hospital Stay: Payer: Medicare Other | Admitting: Family Medicine

## 2015-03-05 ENCOUNTER — Emergency Department (HOSPITAL_COMMUNITY): Payer: Medicare Other

## 2015-03-05 ENCOUNTER — Encounter (HOSPITAL_COMMUNITY): Payer: Self-pay | Admitting: Emergency Medicine

## 2015-03-05 ENCOUNTER — Inpatient Hospital Stay (HOSPITAL_COMMUNITY)
Admission: EM | Admit: 2015-03-05 | Discharge: 2015-03-10 | DRG: 871 | Disposition: A | Discharge status: Home Health | Payer: Medicare Other | Attending: Family Medicine | Admitting: Family Medicine

## 2015-03-05 DIAGNOSIS — Z886 Allergy status to analgesic agent status: Secondary | ICD-10-CM

## 2015-03-05 DIAGNOSIS — J189 Pneumonia, unspecified organism: Secondary | ICD-10-CM | POA: Diagnosis present

## 2015-03-05 DIAGNOSIS — Z888 Allergy status to other drugs, medicaments and biological substances status: Secondary | ICD-10-CM

## 2015-03-05 DIAGNOSIS — R4182 Altered mental status, unspecified: Secondary | ICD-10-CM | POA: Insufficient documentation

## 2015-03-05 DIAGNOSIS — K219 Gastro-esophageal reflux disease without esophagitis: Secondary | ICD-10-CM | POA: Diagnosis present

## 2015-03-05 DIAGNOSIS — Z951 Presence of aortocoronary bypass graft: Secondary | ICD-10-CM

## 2015-03-05 DIAGNOSIS — N529 Male erectile dysfunction, unspecified: Secondary | ICD-10-CM | POA: Diagnosis present

## 2015-03-05 DIAGNOSIS — S069XAA Unspecified intracranial injury with loss of consciousness status unknown, initial encounter: Secondary | ICD-10-CM | POA: Diagnosis present

## 2015-03-05 DIAGNOSIS — G40909 Epilepsy, unspecified, not intractable, without status epilepticus: Secondary | ICD-10-CM | POA: Diagnosis present

## 2015-03-05 DIAGNOSIS — N390 Urinary tract infection, site not specified: Secondary | ICD-10-CM

## 2015-03-05 DIAGNOSIS — M549 Dorsalgia, unspecified: Secondary | ICD-10-CM | POA: Diagnosis present

## 2015-03-05 DIAGNOSIS — Z885 Allergy status to narcotic agent status: Secondary | ICD-10-CM

## 2015-03-05 DIAGNOSIS — F17210 Nicotine dependence, cigarettes, uncomplicated: Secondary | ICD-10-CM | POA: Diagnosis present

## 2015-03-05 DIAGNOSIS — B961 Klebsiella pneumoniae [K. pneumoniae] as the cause of diseases classified elsewhere: Secondary | ICD-10-CM | POA: Diagnosis present

## 2015-03-05 DIAGNOSIS — A419 Sepsis, unspecified organism: Principal | ICD-10-CM | POA: Diagnosis present

## 2015-03-05 DIAGNOSIS — E7800 Pure hypercholesterolemia, unspecified: Secondary | ICD-10-CM | POA: Diagnosis present

## 2015-03-05 DIAGNOSIS — Z7982 Long term (current) use of aspirin: Secondary | ICD-10-CM

## 2015-03-05 DIAGNOSIS — S069X9A Unspecified intracranial injury with loss of consciousness of unspecified duration, initial encounter: Secondary | ICD-10-CM | POA: Diagnosis present

## 2015-03-05 DIAGNOSIS — I714 Abdominal aortic aneurysm, without rupture, unspecified: Secondary | ICD-10-CM | POA: Diagnosis present

## 2015-03-05 DIAGNOSIS — N508 Other specified disorders of male genital organs: Secondary | ICD-10-CM | POA: Diagnosis present

## 2015-03-05 DIAGNOSIS — F4310 Post-traumatic stress disorder, unspecified: Secondary | ICD-10-CM | POA: Diagnosis present

## 2015-03-05 DIAGNOSIS — F5232 Male orgasmic disorder: Secondary | ICD-10-CM | POA: Diagnosis present

## 2015-03-05 DIAGNOSIS — Z6820 Body mass index (BMI) 20.0-20.9, adult: Secondary | ICD-10-CM

## 2015-03-05 DIAGNOSIS — E43 Unspecified severe protein-calorie malnutrition: Secondary | ICD-10-CM | POA: Diagnosis present

## 2015-03-05 DIAGNOSIS — Z79899 Other long term (current) drug therapy: Secondary | ICD-10-CM

## 2015-03-05 DIAGNOSIS — Z8249 Family history of ischemic heart disease and other diseases of the circulatory system: Secondary | ICD-10-CM

## 2015-03-05 DIAGNOSIS — Z87820 Personal history of traumatic brain injury: Secondary | ICD-10-CM

## 2015-03-05 DIAGNOSIS — R05 Cough: Secondary | ICD-10-CM

## 2015-03-05 DIAGNOSIS — F17200 Nicotine dependence, unspecified, uncomplicated: Secondary | ICD-10-CM | POA: Diagnosis present

## 2015-03-05 DIAGNOSIS — G8929 Other chronic pain: Secondary | ICD-10-CM | POA: Diagnosis present

## 2015-03-05 DIAGNOSIS — Y95 Nosocomial condition: Secondary | ICD-10-CM | POA: Diagnosis present

## 2015-03-05 DIAGNOSIS — R059 Cough, unspecified: Secondary | ICD-10-CM

## 2015-03-05 DIAGNOSIS — I2510 Atherosclerotic heart disease of native coronary artery without angina pectoris: Secondary | ICD-10-CM | POA: Diagnosis present

## 2015-03-05 DIAGNOSIS — I5022 Chronic systolic (congestive) heart failure: Secondary | ICD-10-CM | POA: Diagnosis present

## 2015-03-05 DIAGNOSIS — N3000 Acute cystitis without hematuria: Secondary | ICD-10-CM | POA: Insufficient documentation

## 2015-03-05 DIAGNOSIS — J449 Chronic obstructive pulmonary disease, unspecified: Secondary | ICD-10-CM | POA: Diagnosis present

## 2015-03-05 DIAGNOSIS — R64 Cachexia: Secondary | ICD-10-CM | POA: Diagnosis present

## 2015-03-05 DIAGNOSIS — Z23 Encounter for immunization: Secondary | ICD-10-CM

## 2015-03-05 DIAGNOSIS — E785 Hyperlipidemia, unspecified: Secondary | ICD-10-CM | POA: Diagnosis present

## 2015-03-05 DIAGNOSIS — N509 Disorder of male genital organs, unspecified: Secondary | ICD-10-CM

## 2015-03-05 DIAGNOSIS — E876 Hypokalemia: Secondary | ICD-10-CM | POA: Diagnosis present

## 2015-03-05 DIAGNOSIS — K861 Other chronic pancreatitis: Secondary | ICD-10-CM | POA: Diagnosis present

## 2015-03-05 DIAGNOSIS — R32 Unspecified urinary incontinence: Secondary | ICD-10-CM | POA: Diagnosis present

## 2015-03-05 DIAGNOSIS — R531 Weakness: Secondary | ICD-10-CM | POA: Insufficient documentation

## 2015-03-05 LAB — COMPREHENSIVE METABOLIC PANEL
ALK PHOS: 55 U/L (ref 38–126)
ALT: 8 U/L — ABNORMAL LOW (ref 17–63)
AST: 20 U/L (ref 15–41)
Albumin: 1.5 g/dL — ABNORMAL LOW (ref 3.5–5.0)
Anion gap: 10 (ref 5–15)
BUN: 14 mg/dL (ref 6–20)
CHLORIDE: 96 mmol/L — AB (ref 101–111)
CO2: 26 mmol/L (ref 22–32)
Calcium: 8.2 mg/dL — ABNORMAL LOW (ref 8.9–10.3)
Creatinine, Ser: 1.2 mg/dL (ref 0.61–1.24)
Glucose, Bld: 89 mg/dL (ref 65–99)
Potassium: 3.2 mmol/L — ABNORMAL LOW (ref 3.5–5.1)
Sodium: 132 mmol/L — ABNORMAL LOW (ref 135–145)
TOTAL PROTEIN: 4.7 g/dL — AB (ref 6.5–8.1)
Total Bilirubin: 0.3 mg/dL (ref 0.3–1.2)

## 2015-03-05 LAB — URINALYSIS, ROUTINE W REFLEX MICROSCOPIC
Glucose, UA: 100 mg/dL — AB
Hgb urine dipstick: NEGATIVE
KETONES UR: 15 mg/dL — AB
Leukocytes, UA: NEGATIVE
NITRITE: NEGATIVE
PH: 5.5 (ref 5.0–8.0)
Protein, ur: 30 mg/dL — AB
SPECIFIC GRAVITY, URINE: 1.026 (ref 1.005–1.030)
UROBILINOGEN UA: 1 mg/dL (ref 0.0–1.0)

## 2015-03-05 LAB — URINE MICROSCOPIC-ADD ON

## 2015-03-05 LAB — CBC WITH DIFFERENTIAL/PLATELET
BASOS ABS: 0 10*3/uL (ref 0.0–0.1)
Basophils Relative: 0 % (ref 0–1)
EOS ABS: 0 10*3/uL (ref 0.0–0.7)
Eosinophils Relative: 0 % (ref 0–5)
HEMATOCRIT: 28 % — AB (ref 39.0–52.0)
HEMOGLOBIN: 9.5 g/dL — AB (ref 13.0–17.0)
Lymphocytes Relative: 10 % — ABNORMAL LOW (ref 12–46)
Lymphs Abs: 1.8 10*3/uL (ref 0.7–4.0)
MCH: 33.5 pg (ref 26.0–34.0)
MCHC: 33.9 g/dL (ref 30.0–36.0)
MCV: 98.6 fL (ref 78.0–100.0)
MONO ABS: 2.1 10*3/uL — AB (ref 0.1–1.0)
Monocytes Relative: 12 % (ref 3–12)
Neutro Abs: 13.9 10*3/uL — ABNORMAL HIGH (ref 1.7–7.7)
Neutrophils Relative %: 78 % — ABNORMAL HIGH (ref 43–77)
PLATELETS: 131 10*3/uL — AB (ref 150–400)
RBC: 2.84 MIL/uL — ABNORMAL LOW (ref 4.22–5.81)
RDW: 13.3 % (ref 11.5–15.5)
WBC: 17.8 10*3/uL — ABNORMAL HIGH (ref 4.0–10.5)

## 2015-03-05 LAB — I-STAT TROPONIN, ED: Troponin i, poc: 0.02 ng/mL (ref 0.00–0.08)

## 2015-03-05 LAB — LIPASE, BLOOD: LIPASE: 45 U/L (ref 22–51)

## 2015-03-05 LAB — AMMONIA: AMMONIA: 16 umol/L (ref 9–35)

## 2015-03-05 MED ORDER — FUROSEMIDE 20 MG PO TABS: 40.0000 mg | ORAL_TABLET | ORAL | Status: DC

## 2015-03-05 MED ORDER — ENSURE ENLIVE PO LIQD
237.0000 mL | Freq: Three times a day (TID) | ORAL | Status: DC
  Administered 2015-03-07: 237 mL via ORAL
  Filled 2015-03-05 (×2): qty 237

## 2015-03-05 MED ORDER — FAMOTIDINE 20 MG PO TABS
20.0000 mg | ORAL_TABLET | Freq: Every day | ORAL | Status: DC
  Administered 2015-03-05 – 2015-03-10 (×6): 20 mg via ORAL
  Filled 2015-03-05 (×6): qty 1

## 2015-03-05 MED ORDER — DIVALPROEX SODIUM 500 MG PO DR TAB
500.0000 mg | DELAYED_RELEASE_TABLET | Freq: Three times a day (TID) | ORAL | Status: DC
  Administered 2015-03-05 – 2015-03-10 (×15): 500 mg via ORAL
  Filled 2015-03-05: qty 1
  Filled 2015-03-05: qty 2
  Filled 2015-03-05 (×3): qty 1
  Filled 2015-03-05: qty 2
  Filled 2015-03-05 (×3): qty 1
  Filled 2015-03-05 (×3): qty 2
  Filled 2015-03-05 (×3): qty 1
  Filled 2015-03-05 (×2): qty 2

## 2015-03-05 MED ORDER — FUROSEMIDE 40 MG PO TABS
40.0000 mg | ORAL_TABLET | ORAL | Status: DC
  Administered 2015-03-06 – 2015-03-10 (×3): 40 mg via ORAL
  Filled 2015-03-05: qty 2
  Filled 2015-03-05 (×2): qty 1

## 2015-03-05 MED ORDER — LISINOPRIL 2.5 MG PO TABS
2.5000 mg | ORAL_TABLET | Freq: Every day | ORAL | Status: DC
  Administered 2015-03-06: 2.5 mg via ORAL
  Filled 2015-03-05 (×2): qty 1

## 2015-03-05 MED ORDER — FUROSEMIDE 20 MG PO TABS
40.0000 mg | ORAL_TABLET | ORAL | Status: DC
  Filled 2015-03-05: qty 2

## 2015-03-05 NOTE — ED Notes (Signed)
Family at bedside requesting to speak to [place] CSW. Jody aware

## 2015-03-05 NOTE — ED Notes (Signed)
Patient transported to CT 

## 2015-03-05 NOTE — ED Notes (Signed)
Pt family reports that he has not been acting like himself x14 days. Pt has intermittent episodes of altered mental status. Pt recently admitted to the hospital for pancreatitis. NAD at this time. Pt alert x4.

## 2015-03-05 NOTE — Progress Notes (Signed)
CSW met with pt's wife, daughter and BIL to discuss d/c planning.  Pt has had 2 recent admissions and wife is having a difficult time caring for him at home in his current state.  Wife agreeable to seek placement in [place] from the ED as pt does not need admission to the acute hospital.  [facility] prepared and faxed, [facility] number secured.  Awaiting bed offers.  W/E CSW will f/u.

## 2015-03-05 NOTE — Progress Notes (Addendum)
ED CM consulted by EDP, Romeo AppleHarrison for pt with recent d/c a few days ago from hospital. Wife now expresses inability to care for pt at home CM spoke with EDP about SW consult for placement [id number] ED Cm [facility] SW to update her on call and interest in facility placement for pt Provided [phone] to reach EDP [phone] Spoke with Earna CoderZachary SW about April 2016 admission

## 2015-03-05 NOTE — ED Notes (Signed)
Jody at bedside. 

## 2015-03-05 NOTE — ED Notes (Signed)
Pt given medications; requesting crackers to eat, crackers given. Does not want ensure at this time. Pt repositioned in bed; resting with eyes closed

## 2015-03-05 NOTE — ED Provider Notes (Signed)
CSN: [id number]     Arrival date & time 03/05/15  1032 History   First MD Initiated Contact with Patient 03/05/15 1036     Chief Complaint  Patient presents with  . Fatigue  . Altered Mental Status     (Consider location/radiation/quality/duration/timing/severity/associated sxs/prior Treatment) Patient is a 64 y.o. male presenting with altered mental status. The history is provided by the patient.  Altered Mental Status Presenting symptoms: confusion   Severity:  Moderate Most recent episode:  More than 2 days ago Episode history:  Continuous Duration:  2 weeks Timing:  Constant Progression:  Unchanged Chronicity:  New Context comment:  Spontaneous Associated symptoms: vomiting and weakness   Associated symptoms: no abdominal pain, no fever, no headaches and no nausea     Past Medical History  Diagnosis Date  . Seizure disorder   . Nausea   . History of heartburn   . Chronic back pain   . IHD (ischemic heart disease)   . Cigarette smoker   . Hypercholesterolemia   . CHF (congestive heart failure)     EF had been 20% initially. 2010 EF 35-40%.  . Traumatic brain injury   . Seizures   . COPD (chronic obstructive pulmonary disease)   . Pancreatitis    Past Surgical History  Procedure Laterality Date  . Cardiac catheterization  03/12/2007  . Coronary artery bypass graft  2000    x6, graft to the PDA and LAD, patent saphenous vein graft to acute marginal and posterior lateral severely diseased saphenous vein graft to diagonal. Patent LIMA to the LAD.  Marland Kitchen Shoulder surgery      right  . Facial reconstruction surgery    . Back surgery     Family History  Problem Relation Age of Onset  . Heart disease Brother   . Hypertension Brother   . Heart attack Brother   . Heart disease Sister   . Coronary artery disease Brother   . Hypertension Brother   . Heart attack Brother   . Coronary artery disease Sister   . Diabetes Brother   . Thyroid disease Brother   . Heart  attack Brother   . Diabetes Father   . Heart disease Father   . Hypertension Father   . Heart attack Father   . Hypertension Mother   . Varicose Veins Mother    History  Substance Use Topics  . Smoking status: Current Every Day Smoker -- 0.75 packs/day for 50 years    Types: Cigarettes  . Smokeless tobacco: Never Used  . Alcohol Use: No    Review of Systems  Constitutional: Negative for fever.  HENT: Negative for drooling and rhinorrhea.   Eyes: Negative for pain.  Respiratory: Positive for cough. Negative for shortness of breath.   Cardiovascular: Negative for chest pain and leg swelling.  Gastrointestinal: Positive for vomiting and diarrhea. Negative for nausea and abdominal pain.  Genitourinary: Negative for dysuria and hematuria.  Musculoskeletal: Negative for gait problem and neck pain.       Edema in distal extremities  Skin: Negative for color change.  Neurological: Positive for weakness. Negative for numbness and headaches.       Confusion  Hematological: Negative for adenopathy.  Psychiatric/Behavioral: Positive for confusion. Negative for behavioral problems.  All other systems reviewed and are negative.     Allergies  Aspirin and Morphine and related  Home Medications   Prior to Admission medications   Medication Sig Start Date End Date Taking? Authorizing Provider  acetaminophen (TYLENOL) 650 MG CR tablet Take 650 mg by mouth every 8 (eight) hours as needed for pain.    Historical Provider, MD  aspirin 325 MG tablet Take 325 mg by mouth daily.      Historical Provider, MD  divalproex (DEPAKOTE) 500 MG EC tablet Take 500 mg by mouth 3 (three) times daily. [place] [facility] [place]    Historical Provider, MD  docusate sodium (COLACE) 100 MG capsule Take 1 capsule (100 mg total) by mouth 2 (two) times daily as needed for mild constipation. Patient not taking: Reported on 02/20/2015 03/30/14   Stevie Kernyan McLennan, MD  feeding supplement, ENSURE  ENLIVE, (ENSURE ENLIVE) LIQD Take 237 mLs by mouth 3 (three) times daily between meals. 02/26/15   Yolande Jollyaleb G Melancon, MD  furosemide (LASIX) 40 MG tablet TAKE ONE TABLET BY MOUTH EVERY OTHER DAY    Rollene RotundaJames Hochrein, MD  lisinopril (PRINIVIL,ZESTRIL) 2.5 MG tablet Take 1 tablet (2.5 mg total) by mouth daily. 03/02/15   Ardith Darkaleb M Parker, MD  ondansetron (ZOFRAN) 4 MG tablet Take 1 tablet (4 mg total) by mouth every 8 (eight) hours as needed for nausea or vomiting. Patient not taking: Reported on 02/20/2015 11/11/14   Tommie SamsJayce G Cook, DO  oxyCODONE-acetaminophen (PERCOCET/ROXICET) 5-325 MG per tablet Take 1 tablet by mouth every 6 (six) hours as needed for severe pain. Patient not taking: Reported on 02/20/2015 03/30/14   Stevie Kernyan McLennan, MD  pravastatin (PRAVACHOL) 40 MG tablet Take 1 tablet (40 mg total) by mouth daily. 05/14/14   Rollene RotundaJames Hochrein, MD  ranitidine (ZANTAC) 150 MG capsule Take 300 mg by mouth daily.     Historical Provider, MD  tamsulosin (FLOMAX) 0.4 MG CAPS capsule Take 1 capsule (0.4 mg total) by mouth daily. 02/26/15   Hillery Hunteraleb G Melancon, MD   BP 98/58 mmHg  Pulse 79  Temp(Src) 99.5 F (37.5 C) (Oral)  Resp 16  SpO2 97% Physical Exam  Constitutional: He is oriented to person, place, and time. He appears well-developed and well-nourished.  HENT:  Head: Normocephalic and atraumatic.  Right Ear: External ear normal.  Left Ear: External ear normal.  Nose: Nose normal.  Mouth/Throat: Oropharynx is clear and moist. No oropharyngeal exudate.  Eyes: Conjunctivae and EOM are normal. Pupils are equal, round, and reactive to light.  Neck: Normal range of motion. Neck supple.  Cardiovascular: Normal rate, regular rhythm, normal heart sounds and intact distal pulses.  Exam reveals no gallop and no friction rub.   No murmur heard. Pulmonary/Chest: Effort normal and breath sounds normal. No respiratory distress. He has no wheezes.  Abdominal: Soft. Bowel sounds are normal. He exhibits no distension. There is  no tenderness. There is no rebound and no guarding.  Genitourinary:  Erythema and dry scaling most prominent on the proximal medial thighs and scrotum.  Musculoskeletal: Normal range of motion. He exhibits edema (mild pitting edema and distal extremities.). He exhibits no tenderness.  Neurological: He is alert and oriented to person, place, and time.  alert, oriented x3 speech: normal in context and clarity memory: intact grossly cranial nerves II-XII: intact motor strength: full proximally and distally sensation: intact to light touch diffusely  cerebellar: finger-to-nose intact gait: normal gait w/ minimal assistance  Skin: Skin is warm and dry.  Psychiatric: He has a normal mood and affect. His behavior is normal.  Nursing note and vitals reviewed.   ED Course  Procedures (including critical care time) Labs Review Labs Reviewed  CBC WITH DIFFERENTIAL/PLATELET -  Abnormal; Notable for the following:    WBC 17.8 (*)    RBC 2.84 (*)    Hemoglobin 9.5 (*)    HCT 28.0 (*)    Platelets 131 (*)    Neutrophils Relative % 78 (*)    Neutro Abs 13.9 (*)    Lymphocytes Relative 10 (*)    Monocytes Absolute 2.1 (*)    All other components within normal limits  COMPREHENSIVE METABOLIC PANEL - Abnormal; Notable for the following:    Sodium 132 (*)    Potassium 3.2 (*)    Chloride 96 (*)    Calcium 8.2 (*)    Total Protein 4.7 (*)    Albumin 1.5 (*)    ALT 8 (*)    All other components within normal limits  URINALYSIS, ROUTINE W REFLEX MICROSCOPIC - Abnormal; Notable for the following:    Glucose, UA 100 (*)    Bilirubin Urine SMALL (*)    Ketones, ur 15 (*)    Protein, ur 30 (*)    All other components within normal limits  LIPASE, BLOOD  AMMONIA  URINE MICROSCOPIC-ADD ON  Rosezena Sensor, ED    Imaging Review Dg Chest 2 View  03/05/2015   CLINICAL DATA:  Weakness today; productive cough Xfew days; hx CHF, COPD  EXAM: CHEST  2 VIEW  COMPARISON:  02/27/2015  FINDINGS:  Subtle hazy airspace opacity in the right upper lobe similar to the prior study. There is hazy airspace opacity in the left lower lung zone also unchanged. Small bilateral effusions stable. No new areas of lung opacity. No convincing pulmonary edema or pneumonia. Residual areas of lung opacity likely atelectasis. No pneumothorax.  Changes from CABG surgery are stable. Cardiac silhouette mildly enlarged. No mediastinal or hilar masses.  IMPRESSION: 1. Small bilateral pleural effusions. Mild areas of lung opacity most likely atelectasis, similar to the prior study. 2. No evidence of pneumonia.  No convincing pulmonary edema.   Electronically Signed   By: Amie Portland M.D.   On: 03/05/2015 12:26   Ct Head Wo Contrast  03/05/2015   CLINICAL DATA:  Altered mental status. Personal history of traumatic brain injury. Fatigue. Abnormal behavior for 2 weeks.  EXAM: CT HEAD WITHOUT CONTRAST  TECHNIQUE: Contiguous axial images were obtained from the base of the skull through the vertex without intravenous contrast.  COMPARISON:  CT head without contrast 03/30/2014.  FINDINGS: Moderate atrophy and white matter changes are similar to the prior study. This is advanced for age. No acute cortical infarct, hemorrhage, or mass lesion is present. The ventricles are proportionate to the degree of atrophy. No significant extra-axial fluid collection is present.  Mucosal thickening is evident along the floor of the left maxillary sinus. There is some lucency surrounding roots of the left first and second maxillary molars. A posterior right ethmoid air cell is opacified. The paranasal sinuses and mastoid air cells are otherwise clear. Atherosclerotic calcifications are noted in the cavernous internal carotid arteries bilaterally.  The extracranial soft tissues are within normal limits. The globes orbits are intact.  IMPRESSION: 1. Stable atrophy and white matter disease, advanced for age. This is nonspecific, but likely reflects the  sequela of chronic microvascular ischemia. 2. Mild left maxillary and posterior right ethmoid sinus disease. 3. No acute intracranial abnormality. 4. Left maxillary dental disease.   Electronically Signed   By: Marin Roberts M.D.   On: 03/05/2015 12:08     EKG Interpretation   Date/Time:  Friday Mar 05 2015  11:56:21 EDT Ventricular Rate:  78 PR Interval:  124 QRS Duration: 100 QT Interval:  365 QTC Calculation: 416 R Axis:   57 Text Interpretation:  Sinus rhythm Ventricular premature complex Low  voltage, extremity leads Nonspecific T abnormalities, lateral leads No  significant change since last tracing Confirmed by Lateka Rady  MD, Pawan Knechtel  ([id number]) on 03/05/2015 12:23:20 PM      MDM   Final diagnoses:  Cough  Generalized weakness    11:13 AM 64 y.o. male w hx of HFrEF, CAD s/ CABG, COPD, TBI, seizures, PTSD, chronic pancreatitis s/p admissions recently for confusion and pancreatitis who presents with continued confusion. His wife states that since discharge she has had decreased to no oral intake. She notes that he had some diarrhea on the first day of discharge but this has resolved. He has had one episode of emesis. She denies any fevers. He has no specific complaints on exam currently. Soft blood pressure vital signs otherwise unremarkable. In review of the notes it appears that he was offered skilled nursing facility placement but the wife declined. She states that she is been any able to care for him at home due to his confusion and his inability to cooperate. We'll get screening labs and imaging.  He denies SI/HI.   3:35 PM Workup non-contrib. I spoke w/ Jody from SW who is working on placement from ED given pt's recent admission.   Dr. Preston FleetingGlick made aware of case.   Purvis SheffieldForrest Dany Harten, MD 03/06/15 [phone]

## 2015-03-05 NOTE — ED Notes (Signed)
Pt resting with eyes closed; easily aroused. Pt awaiting possible nursing home placement

## 2015-03-05 NOTE — ED Notes (Signed)
Given report from Beryle QuantKim F RN: Pt was recently discharged from hospital and is living at home with wife. Over the past two weeks, pt has been unable to care for himself and wife is unable to provide full care for patient. Awaiting nursing home placement

## 2015-03-06 LAB — CULTURE, BLOOD (ROUTINE X 2)
CULTURE: NO GROWTH
CULTURE: NO GROWTH

## 2015-03-06 MED ORDER — ACETAMINOPHEN 325 MG PO TABS
650.0000 mg | ORAL_TABLET | Freq: Once | ORAL | Status: AC
  Administered 2015-03-06: 650 mg via ORAL
  Filled 2015-03-06: qty 2

## 2015-03-06 NOTE — ED Notes (Signed)
Patient ate half a [place] sandwich. Refused breakfast tray.

## 2015-03-06 NOTE — ED Notes (Signed)
While family visited w/pt - pt asked for assistance w/urinal on several occasions. NT assisted pt per family request.

## 2015-03-06 NOTE — ED Notes (Signed)
Pt offered water to drink, drank approx. 

## 2015-03-06 NOTE — ED Notes (Signed)
COPY OF PT'S INSURANCE CARD AND PAPERWORK RE: DEPAKOTE PLACED ON PT'S CHART AS PER DAUGHTER'S REQUEST AND SW.

## 2015-03-06 NOTE — Evaluation (Signed)
Physical Therapy Evaluation Patient Details Name: [name] MRN: [id number] DOB: Mar 08, 1951 Today's Date: 03/06/2015   History of Present Illness  Pt brought to ED by wife for AMS x 14 days. 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  Clinical Impression  Pt presenting with generalized weakness, cognitive impairment, and increased falls risk. Pt currently requires assist for all mobility and ADLs. Staff has been assisting patient with feeding. Although wife home 24/7 wife unable to provide physical assistance patient requires at this time. Patient currently unsafe to return home as he can not care for him self nor can his wife provide the level of care he needs at this time. ST-SNF would allow patient to progress mobility to a supervision level that would allow patient to safely transition home.    Follow Up Recommendations SNF;Supervision/Assistance - 24 hour    Equipment Recommendations  None recommended by PT    Recommendations for Other Services       Precautions / Restrictions Precautions Precautions: Fall Restrictions Weight Bearing Restrictions: No      Mobility  Bed Mobility Overal bed mobility: Needs Assistance Bed Mobility: Supine to Sit;Sit to Supine     Supine to sit: HOB elevated;Mod assist Sit to supine: Min assist   General bed mobility comments: max directional v/c's, increased time, assist for LE management back into bed and trunk elevation to EOB  Transfers Overall transfer level: Needs assistance Equipment used: 1 person hand held assist Transfers: Sit to/from Stand Sit to Stand: Min assist         General transfer comment: pt used HHA, slow and guarded with bilat flexed knees and trunk  Ambulation/Gait Ambulation/Gait assistance: Mod assist Ambulation Distance (Feet): 100 Feet Assistive device: 1 person hand held assist Gait Pattern/deviations: Decreased stride  length;Shuffle;Step-through pattern;Trunk flexed;Narrow base of support Gait velocity: decreased Gait velocity interpretation: <1.8 ft/sec, indicative of risk for recurrent falls General Gait Details: pt requires physical assist to maintain balance . pt with slow guarded steps. would benefit from RW. pt at high falls risk  Stairs            Wheelchair Mobility    Modified Rankin (Stroke Patients Only)       Balance Overall balance assessment: Needs assistance Sitting-balance support: Feet supported;No upper extremity supported Sitting balance-Leahy Scale: Fair Sitting balance - Comments: posterior lean   Standing balance support: During functional activity Standing balance-Leahy Scale: Poor Standing balance comment: pt stood to use urinal but required PT to steady him                             Pertinent Vitals/Pain Pain Assessment:  (pt did not report pain)    Home Living Family/patient expects to be discharged to:: Skilled nursing facility Living Arrangements: Spouse/significant other Available Help at Discharge: Family;Available 24 hours/day Type of Home: House Home Access: Stairs to enter Entrance Stairs-Rails: Right Entrance Stairs-Number of Steps: 2-3 Home Layout: One level Home Equipment: Walker - 2 wheels      Prior Function Level of Independence: Needs assistance         Comments: pt poor historian andc unsure of accuracy. Home set up taken from chart of previous admission. per chart pt having difficulty with mobility and ADLs and wife unable to physically care for patient     Hand Dominance        Extremity/Trunk Assessment  Upper Extremity Assessment: Generalized weakness           Lower Extremity Assessment: Generalized weakness (extremely tight bilat hamstrings)      Cervical / Trunk Assessment: Kyphotic (noted skin breakdown in thoracic area)  Communication   Communication:  (delayed speech and soft spoken)   Cognition Arousal/Alertness: Awake/alert Behavior During Therapy: Flat affect Overall Cognitive Status: No family/caregiver present to determine baseline cognitive functioning Area of Impairment: Attention;Memory;Following commands;Problem solving;Safety/judgement;Awareness   Current Attention Level: Sustained   Following Commands: Follows one step commands with increased time Safety/Judgement: Decreased awareness of deficits Awareness: Intellectual Problem Solving: Slow processing;Difficulty sequencing;Requires verbal cues;Requires tactile cues General Comments: pt unable to figure out soap and water dispenser despite max v/c's    General Comments      Exercises        Assessment/Plan    PT Assessment Patient needs continued PT services  PT Diagnosis Generalized weakness;Abnormality of gait   PT Problem List Decreased strength;Decreased balance;Decreased mobility;Decreased safety awareness  PT Treatment Interventions DME instruction;Balance training;Gait training;Stair training;Functional mobility training;Patient/family education;Therapeutic activities;Therapeutic exercise   PT Goals (Current goals can be found in the Care Plan section) Acute Rehab PT Goals Patient Stated Goal: non stsated PT Goal Formulation: With patient Time For Goal Achievement: 03/16/15 Potential to Achieve Goals: Fair    Frequency Min 3X/week   Barriers to discharge Decreased caregiver support spouse unable to physically care for patient    Co-evaluation               End of Session Equipment Utilized During Treatment: Gait belt Activity Tolerance: Patient limited by fatigue Patient left: in bed;with call bell/phone within reach;with nursing/sitter in room Nurse Communication: Mobility status    Functional Assessment Tool Used: clincial judgement Functional Limitation: Mobility: Walking and moving around Mobility: Walking and Moving Around Current Status (N8295): At least 40 percent  but less than 60 percent impaired, limited or restricted Mobility: Walking and Moving Around Goal Status [phone]): At least 1 percent but less than 20 percent impaired, limited or restricted    Time: 0756-0821 PT Time Calculation (min) (ACUTE ONLY): 25 min   Charges:   PT Evaluation $Initial PT Evaluation Tier I: 1 Procedure PT Treatments $Gait Training: 8-22 mins   PT G Codes:   PT G-Codes **NOT FOR INPATIENT CLASS** Functional Assessment Tool Used: clincial judgement Functional Limitation: Mobility: Walking and moving around Mobility: Walking and Moving Around Current Status (Q6578): At least 40 percent but less than 60 percent impaired, limited or restricted Mobility: Walking and Moving Around Goal Status [phone]): At least 1 percent but less than 20 percent impaired, limited or restricted    Marcene Brawn 03/06/2015, 8:36 AM  . Lewis Shock, PT, DPT Pager #: [phone] Office #: [phone]

## 2015-03-06 NOTE — ED Notes (Signed)
LEFT MESSAGE FOR TWYNN, SW, - [phone] RE: PT'S DISPOSITION TO [facility].

## 2015-03-06 NOTE — ED Notes (Signed)
Pt sleeping. 

## 2015-03-06 NOTE — ED Notes (Signed)
Social Worker in for eval and speaking with Dr [facility]Harrison [facility]

## 2015-03-06 NOTE — ED Notes (Signed)
Spoke w/Tammy Glori LuisBlakeley, RN, [facility] x [facility] - she is attempting to have pt admitted today however experiencing difficulty in verifying Medicare. Gave her pt's spouse's number and Registration.

## 2015-03-07 ENCOUNTER — Emergency Department (HOSPITAL_COMMUNITY): Payer: Medicare Other

## 2015-03-07 DIAGNOSIS — I2510 Atherosclerotic heart disease of native coronary artery without angina pectoris: Secondary | ICD-10-CM | POA: Diagnosis not present

## 2015-03-07 DIAGNOSIS — J189 Pneumonia, unspecified organism: Secondary | ICD-10-CM | POA: Diagnosis present

## 2015-03-07 DIAGNOSIS — E785 Hyperlipidemia, unspecified: Secondary | ICD-10-CM | POA: Diagnosis not present

## 2015-03-07 DIAGNOSIS — R64 Cachexia: Secondary | ICD-10-CM | POA: Diagnosis not present

## 2015-03-07 DIAGNOSIS — N508 Other specified disorders of male genital organs: Secondary | ICD-10-CM | POA: Diagnosis not present

## 2015-03-07 DIAGNOSIS — Z888 Allergy status to other drugs, medicaments and biological substances status: Secondary | ICD-10-CM | POA: Diagnosis not present

## 2015-03-07 DIAGNOSIS — N390 Urinary tract infection, site not specified: Secondary | ICD-10-CM | POA: Diagnosis present

## 2015-03-07 DIAGNOSIS — K861 Other chronic pancreatitis: Secondary | ICD-10-CM | POA: Diagnosis not present

## 2015-03-07 DIAGNOSIS — R4182 Altered mental status, unspecified: Secondary | ICD-10-CM | POA: Diagnosis present

## 2015-03-07 DIAGNOSIS — Z23 Encounter for immunization: Secondary | ICD-10-CM | POA: Diagnosis not present

## 2015-03-07 DIAGNOSIS — I5022 Chronic systolic (congestive) heart failure: Secondary | ICD-10-CM | POA: Diagnosis present

## 2015-03-07 DIAGNOSIS — N3000 Acute cystitis without hematuria: Secondary | ICD-10-CM | POA: Diagnosis not present

## 2015-03-07 DIAGNOSIS — F4310 Post-traumatic stress disorder, unspecified: Secondary | ICD-10-CM | POA: Diagnosis not present

## 2015-03-07 DIAGNOSIS — Z79899 Other long term (current) drug therapy: Secondary | ICD-10-CM | POA: Diagnosis not present

## 2015-03-07 DIAGNOSIS — Z7982 Long term (current) use of aspirin: Secondary | ICD-10-CM | POA: Diagnosis not present

## 2015-03-07 DIAGNOSIS — Z951 Presence of aortocoronary bypass graft: Secondary | ICD-10-CM | POA: Diagnosis not present

## 2015-03-07 DIAGNOSIS — F17210 Nicotine dependence, cigarettes, uncomplicated: Secondary | ICD-10-CM | POA: Diagnosis not present

## 2015-03-07 DIAGNOSIS — G40909 Epilepsy, unspecified, not intractable, without status epilepticus: Secondary | ICD-10-CM | POA: Diagnosis not present

## 2015-03-07 DIAGNOSIS — K219 Gastro-esophageal reflux disease without esophagitis: Secondary | ICD-10-CM | POA: Diagnosis not present

## 2015-03-07 DIAGNOSIS — E876 Hypokalemia: Secondary | ICD-10-CM | POA: Diagnosis not present

## 2015-03-07 DIAGNOSIS — G8929 Other chronic pain: Secondary | ICD-10-CM | POA: Diagnosis not present

## 2015-03-07 DIAGNOSIS — Z8249 Family history of ischemic heart disease and other diseases of the circulatory system: Secondary | ICD-10-CM | POA: Diagnosis not present

## 2015-03-07 DIAGNOSIS — Z6820 Body mass index (BMI) 20.0-20.9, adult: Secondary | ICD-10-CM | POA: Diagnosis not present

## 2015-03-07 DIAGNOSIS — M549 Dorsalgia, unspecified: Secondary | ICD-10-CM | POA: Diagnosis not present

## 2015-03-07 DIAGNOSIS — R32 Unspecified urinary incontinence: Secondary | ICD-10-CM | POA: Diagnosis not present

## 2015-03-07 DIAGNOSIS — E43 Unspecified severe protein-calorie malnutrition: Secondary | ICD-10-CM | POA: Diagnosis present

## 2015-03-07 DIAGNOSIS — B961 Klebsiella pneumoniae [K. pneumoniae] as the cause of diseases classified elsewhere: Secondary | ICD-10-CM | POA: Diagnosis not present

## 2015-03-07 DIAGNOSIS — J449 Chronic obstructive pulmonary disease, unspecified: Secondary | ICD-10-CM | POA: Diagnosis not present

## 2015-03-07 DIAGNOSIS — Z886 Allergy status to analgesic agent status: Secondary | ICD-10-CM | POA: Diagnosis not present

## 2015-03-07 DIAGNOSIS — Z87820 Personal history of traumatic brain injury: Secondary | ICD-10-CM | POA: Diagnosis not present

## 2015-03-07 DIAGNOSIS — Y95 Nosocomial condition: Secondary | ICD-10-CM | POA: Diagnosis not present

## 2015-03-07 DIAGNOSIS — E780 Pure hypercholesterolemia: Secondary | ICD-10-CM | POA: Diagnosis not present

## 2015-03-07 DIAGNOSIS — A419 Sepsis, unspecified organism: Secondary | ICD-10-CM | POA: Diagnosis present

## 2015-03-07 DIAGNOSIS — I714 Abdominal aortic aneurysm, without rupture: Secondary | ICD-10-CM | POA: Diagnosis not present

## 2015-03-07 DIAGNOSIS — R531 Weakness: Secondary | ICD-10-CM | POA: Diagnosis not present

## 2015-03-07 DIAGNOSIS — Z885 Allergy status to narcotic agent status: Secondary | ICD-10-CM | POA: Diagnosis not present

## 2015-03-07 DIAGNOSIS — N529 Male erectile dysfunction, unspecified: Secondary | ICD-10-CM | POA: Diagnosis not present

## 2015-03-07 LAB — CBC WITH DIFFERENTIAL/PLATELET
BASOS ABS: 0 10*3/uL (ref 0.0–0.1)
Basophils Relative: 0 % (ref 0–1)
EOS ABS: 0 10*3/uL (ref 0.0–0.7)
Eosinophils Relative: 0 % (ref 0–5)
HCT: 29.7 % — ABNORMAL LOW (ref 39.0–52.0)
Hemoglobin: 10.1 g/dL — ABNORMAL LOW (ref 13.0–17.0)
LYMPHS ABS: 1.9 10*3/uL (ref 0.7–4.0)
Lymphocytes Relative: 10 % — ABNORMAL LOW (ref 12–46)
MCH: 33.4 pg (ref 26.0–34.0)
MCHC: 34 g/dL (ref 30.0–36.0)
MCV: 98.3 fL (ref 78.0–100.0)
MONOS PCT: 9 % (ref 3–12)
Monocytes Absolute: 1.7 10*3/uL — ABNORMAL HIGH (ref 0.1–1.0)
Neutro Abs: 15.1 10*3/uL — ABNORMAL HIGH (ref 1.7–7.7)
Neutrophils Relative %: 81 % — ABNORMAL HIGH (ref 43–77)
PLATELETS: 185 10*3/uL (ref 150–400)
RBC: 3.02 MIL/uL — ABNORMAL LOW (ref 4.22–5.81)
RDW: 13.4 % (ref 11.5–15.5)
WBC: 18.7 10*3/uL — AB (ref 4.0–10.5)

## 2015-03-07 LAB — URINE MICROSCOPIC-ADD ON

## 2015-03-07 LAB — URINALYSIS, ROUTINE W REFLEX MICROSCOPIC
Glucose, UA: NEGATIVE mg/dL
Ketones, ur: 15 mg/dL — AB
NITRITE: POSITIVE — AB
PH: 6 (ref 5.0–8.0)
PROTEIN: 30 mg/dL — AB
SPECIFIC GRAVITY, URINE: 1.022 (ref 1.005–1.030)
Urobilinogen, UA: 2 mg/dL — ABNORMAL HIGH (ref 0.0–1.0)

## 2015-03-07 LAB — BASIC METABOLIC PANEL
Anion gap: 9 (ref 5–15)
BUN: 13 mg/dL (ref 6–20)
CO2: 29 mmol/L (ref 22–32)
Calcium: 8 mg/dL — ABNORMAL LOW (ref 8.9–10.3)
Chloride: 94 mmol/L — ABNORMAL LOW (ref 101–111)
Creatinine, Ser: 1.15 mg/dL (ref 0.61–1.24)
GLUCOSE: 86 mg/dL (ref 65–99)
POTASSIUM: 3.5 mmol/L (ref 3.5–5.1)
Sodium: 132 mmol/L — ABNORMAL LOW (ref 135–145)

## 2015-03-07 LAB — VALPROIC ACID LEVEL: VALPROIC ACID LVL: 77 ug/mL (ref 50.0–100.0)

## 2015-03-07 LAB — PHOSPHORUS: Phosphorus: 2.4 mg/dL — ABNORMAL LOW (ref 2.5–4.6)

## 2015-03-07 LAB — I-STAT CG4 LACTIC ACID, ED: Lactic Acid, Venous: 1.04 mmol/L (ref 0.5–2.0)

## 2015-03-07 LAB — MAGNESIUM: Magnesium: 1.7 mg/dL (ref 1.7–2.4)

## 2015-03-07 MED ORDER — PIPERACILLIN-TAZOBACTAM 3.375 G IVPB 30 MIN
3.3750 g | Freq: Once | INTRAVENOUS | Status: AC
  Administered 2015-03-07: 3.375 g via INTRAVENOUS
  Filled 2015-03-07: qty 50

## 2015-03-07 MED ORDER — ASPIRIN 325 MG PO TABS
325.0000 mg | ORAL_TABLET | Freq: Every day | ORAL | Status: DC
  Administered 2015-03-07 – 2015-03-10 (×4): 325 mg via ORAL
  Filled 2015-03-07 (×4): qty 1

## 2015-03-07 MED ORDER — PRAVASTATIN SODIUM 40 MG PO TABS
40.0000 mg | ORAL_TABLET | Freq: Every day | ORAL | Status: DC
  Administered 2015-03-07 – 2015-03-10 (×4): 40 mg via ORAL
  Filled 2015-03-07 (×4): qty 1

## 2015-03-07 MED ORDER — ZINC OXIDE 40 % EX OINT
TOPICAL_OINTMENT | Freq: Every morning | CUTANEOUS | Status: DC
  Administered 2015-03-07 – 2015-03-10 (×3): via TOPICAL
  Filled 2015-03-07: qty 114

## 2015-03-07 MED ORDER — VANCOMYCIN HCL 10 G IV SOLR
1500.0000 mg | Freq: Once | INTRAVENOUS | Status: AC
  Administered 2015-03-07: 1500 mg via INTRAVENOUS
  Filled 2015-03-07: qty 1500

## 2015-03-07 MED ORDER — ACETAMINOPHEN 325 MG PO TABS
650.0000 mg | ORAL_TABLET | Freq: Once | ORAL | Status: AC
  Administered 2015-03-07: 650 mg via ORAL
  Filled 2015-03-07: qty 2

## 2015-03-07 MED ORDER — CETYLPYRIDINIUM CHLORIDE 0.05 % MT LIQD
7.0000 mL | Freq: Two times a day (BID) | OROMUCOSAL | Status: DC
  Administered 2015-03-08 – 2015-03-10 (×5): 7 mL via OROMUCOSAL

## 2015-03-07 MED ORDER — ONDANSETRON HCL 4 MG PO TABS: 4.0000 mg | ORAL_TABLET | Freq: Three times a day (TID) | ORAL | Status: DC | PRN

## 2015-03-07 MED ORDER — VANCOMYCIN HCL IN DEXTROSE 1-5 GM/200ML-% IV SOLN
1000.0000 mg | Freq: Two times a day (BID) | INTRAVENOUS | Status: DC
  Administered 2015-03-08 – 2015-03-09 (×3): 1000 mg via INTRAVENOUS
  Filled 2015-03-07 (×5): qty 200

## 2015-03-07 MED ORDER — TAMSULOSIN HCL 0.4 MG PO CAPS
0.4000 mg | ORAL_CAPSULE | Freq: Every day | ORAL | Status: DC
  Administered 2015-03-07 – 2015-03-10 (×4): 0.4 mg via ORAL
  Filled 2015-03-07 (×4): qty 1

## 2015-03-07 MED ORDER — SODIUM CHLORIDE 0.9 % IJ SOLN: 3.0000 mL | INTRAMUSCULAR | Status: DC | PRN

## 2015-03-07 MED ORDER — PNEUMOCOCCAL VAC POLYVALENT 25 MCG/0.5ML IJ INJ
0.5000 mL | INJECTION | INTRAMUSCULAR | Status: AC
  Administered 2015-03-08: 0.5 mL via INTRAMUSCULAR
  Filled 2015-03-07: qty 0.5

## 2015-03-07 MED ORDER — SODIUM CHLORIDE 0.9 % IV SOLN: 250.0000 mL | INTRAVENOUS | Status: DC | PRN

## 2015-03-07 MED ORDER — DOCUSATE SODIUM 100 MG PO CAPS
100.0000 mg | ORAL_CAPSULE | Freq: Two times a day (BID) | ORAL | Status: DC | PRN
  Administered 2015-03-07: 100 mg via ORAL
  Filled 2015-03-07 (×2): qty 1

## 2015-03-07 MED ORDER — SODIUM CHLORIDE 0.9 % IJ SOLN
3.0000 mL | Freq: Two times a day (BID) | INTRAMUSCULAR | Status: DC
Start: 2015-03-07 — End: 2015-03-10
  Administered 2015-03-08 – 2015-03-10 (×4): 3 mL via INTRAVENOUS

## 2015-03-07 MED ORDER — HEPARIN SODIUM (PORCINE) 5000 UNIT/ML IJ SOLN
5000.0000 [IU] | Freq: Three times a day (TID) | INTRAMUSCULAR | Status: DC
  Administered 2015-03-07 – 2015-03-10 (×8): 5000 [IU] via SUBCUTANEOUS
  Filled 2015-03-07 (×11): qty 1

## 2015-03-07 NOTE — ED Notes (Signed)
[facility]

## 2015-03-07 NOTE — ED Provider Notes (Signed)
ED RN took pt's VS before d/c. Pt's oral temp "100.5." This is new since pt's arrival to the ED on 03/05/15 (2 days ago). ED RN states pt also has "barely eaten" over the past 2 days. WBC count 2 days ago was 17.8. Will obtain orthostatic VS and rectal temp. Will re-check labs, UA/UC, CXR. Sign out to Dr. Gwendolyn GrantWalden.  Samuel JesterKathleen Gohan Collister, DO 03/07/15 1635

## 2015-03-07 NOTE — ED Notes (Signed)
In and out cath performed by Zella Ballobin, EMT & this RN. Pt tolerated well.

## 2015-03-07 NOTE — Progress Notes (Signed)
OT Cancellation Note  Patient Details Name: [name] MRN: [id number] DOB: Jan 08, 1951   Cancelled Treatment:    Reason Eval/Treat Not Completed: Other (comment). Per PT note, pt was requiring total A for ADLs and wife is unable to continue managing at home. Pt has Medicare and current D/C plan is SNF. No apparent immediate acute care OT needs, therefore will defer OT to SNF. If OT eval is needed please call Acute Rehab Dept. at [phone] or text page OT at [phone].    Nena JordanMiller, Konya Fauble M 03/07/2015, 7:15 AM

## 2015-03-07 NOTE — ED Provider Notes (Signed)
SW has been trying to place pt in facility. Apparently facility is having difficulty processing pt's insurance and SSN, so she is unable to place pt today. Pt's wife wants to take him home. Home Health services ordered. SW and CM will continue to work on placement tomorrow. Will d/c pt stable.   Samuel JesterKathleen Ryane Konieczny, DO 03/07/15 1510

## 2015-03-07 NOTE — Progress Notes (Signed)
[facility]  Facility has had difficulty in verifying pt's insurance.  SSN provided to facility. Await bed offer.

## 2015-03-07 NOTE — ED Notes (Signed)
ADVISED PT'S SPOUSE, SUSAN Maggio, OF PT'S PENDING ADMISSION. REQUESTED FOR RN TO CALL BACK W/BED NUMBER.

## 2015-03-07 NOTE — ED Provider Notes (Signed)
Awaiting workup at this time. Labs show worsening leukocytosis. Normal lactate. BMP is essentially unchanged. Chest x-Gottsch shows worsening left base opacity and urine is infected. Given Vancomycin and Zosyn since he was recently in the hospital and discharged less than one week ago. Admitted to family medicine.  1. Generalized weakness   2. Cough   3. Altered mental status, unspecified altered mental status type   4. Acute cystitis without hematuria   5. HCAP (healthcare-associated pneumonia)      Elwin MochaBlair Reighlyn Elmes, MD 03/07/15 1825

## 2015-03-07 NOTE — Progress Notes (Signed)
ANTIBIOTIC CONSULT NOTE - INITIAL  Pharmacy Consult for Vancomycin Indication: pneumonia  Allergies  Allergen Reactions  . Aspirin     To much aspirin make his bleed on the inside can take a small amount.  . Flomax [Tamsulosin Hcl] Swelling  . Morphine And Related Other (See Comments)    "MAKES HIM MEAN"    Patient Measurements:   Adjusted Body Weight: n/a   Vital Signs: Temp: 100 F (37.8 C) (05/15 1756) Temp Source: Rectal (05/15 1756) BP: 110/57 mmHg (05/15 1756) Pulse Rate: 84 (05/15 1756) Intake/Output from previous day:   Intake/Output from this shift: Total I/O In: -  Out: 1300 [Urine:1300]  Labs:  Recent Labs  03/05/15 1121 03/07/15 1542  WBC 17.8* 18.7*  HGB 9.5* 10.1*  PLT 131* 185  CREATININE 1.20 1.15   Estimated Creatinine Clearance: 66.4 mL/min (by C-G formula based on Cr of 1.15). No results for input(s): VANCOTROUGH, VANCOPEAK, VANCORANDOM, GENTTROUGH, GENTPEAK, GENTRANDOM, TOBRATROUGH, TOBRAPEAK, TOBRARND, AMIKACINPEAK, AMIKACINTROU, AMIKACIN in the last 72 hours.   Microbiology: Recent Results (from the past 720 hour(s))  Blood Culture (routine x 2)     Status: None   Collection Time: 02/20/15  3:15 PM  Result Value Ref Range Status   Specimen Description BLOOD RIGHT HAND  Final   Special Requests BOTTLES DRAWN AEROBIC ONLY 2CC  Final   Culture   Final    NO GROWTH 5 DAYS Performed at [facility]    Report Status 02/27/2015 FINAL  Final  Blood Culture (routine x 2)     Status: None   Collection Time: 02/20/15  3:20 PM  Result Value Ref Range Status   Specimen Description BLOOD LEFT ANTECUBITAL  Final   Special Requests BOTTLES DRAWN AEROBIC AND ANAEROBIC 5CC  Final   Culture   Final    NO GROWTH 5 DAYS Performed at [facility]    Report Status 02/27/2015 FINAL  Final  Urine culture     Status: None   Collection Time: 02/20/15  4:14 PM  Result Value Ref Range Status   Specimen Description URINE, CATHETERIZED   Final   Special Requests NONE  Final   Colony Count NO GROWTH Performed at [facility]   Final   Culture NO GROWTH Performed at [facility]   Final   Report Status 02/21/2015 FINAL  Final  MRSA PCR Screening     Status: None   Collection Time: 02/20/15  6:37 PM  Result Value Ref Range Status   MRSA by PCR NEGATIVE NEGATIVE Final    Comment:        The GeneXpert MRSA Assay (FDA approved for NASAL specimens only), is one component of a comprehensive MRSA colonization surveillance program. It is not intended to diagnose MRSA infection nor to guide or monitor treatment for MRSA infections.   Culture, blood (routine x 2)     Status: None   Collection Time: 02/28/15  1:38 AM  Result Value Ref Range Status   Specimen Description BLOOD LEFT ANTECUBITAL  Final   Special Requests BOTTLES DRAWN AEROBIC ONLY 7CC  Final   Culture   Final    NO GROWTH 5 DAYS Performed at [facility]    Report Status 03/06/2015 FINAL  Final  Culture, blood (routine x 2)     Status: None   Collection Time: 02/28/15  1:42 AM  Result Value Ref Range Status   Specimen Description BLOOD RIGHT ANTECUBITAL  Final   Special Requests BOTTLES  DRAWN AEROBIC ONLY 10CC  Final   Culture   Final    NO GROWTH 5 DAYS Performed at [facility]    Report Status 03/06/2015 FINAL  Final    Medical History: Past Medical History  Diagnosis Date  . Seizure disorder   . Nausea   . History of heartburn   . Chronic back pain   . IHD (ischemic heart disease)   . Cigarette smoker   . Hypercholesterolemia   . CHF (congestive heart failure)     EF had been 20% initially. 2010 EF 35-40%.  . Traumatic brain injury   . Seizures   . COPD (chronic obstructive pulmonary disease)   . Pancreatitis     Medications:   (Not in a hospital admission) Assessment: [age] YOM with AMS. WBC remains elevated at 18.7 and patient has a Tm of 101.38F. CXR suspicious for pneumonia. Pharmacy consulted to  start Vancomycin for empiric therapy. He has already received a dose of Zosyn in the ED. CrCl ~ 66 mL/min   Goal of Therapy:  Vancomycin trough level 15-20 mcg/ml  Plan:  -Vancomycin 1500 mg IV load followed by Vanc 1 gm IV Q 12 hours -Monitor CBC, renal fx, cultures and clinical progress -[facility] -F/u resuming zosyn on admission   Vinnie LevelBenjamin Heydi Swango, PharmD., BCPS Clinical Pharmacist Pager [phone]

## 2015-03-07 NOTE — ED Notes (Signed)
Pt placed on 5-lead. 

## 2015-03-07 NOTE — ED Notes (Signed)
Elevated pt's feet for comfort.

## 2015-03-07 NOTE — ED Notes (Addendum)
SUSAN, SPOUSE AWARE OF PT'S ROOM ASSIGNMENT. ADVISED SHE WILL COME VISIT PT TOMORROW BUT REQUESTS RN TO NOTIFY HER OF ANY CHANGES W/PT.

## 2015-03-07 NOTE — ED Notes (Signed)
Report attempted 

## 2015-03-07 NOTE — ED Notes (Signed)
ADMITTING MD'S IN W/PT. ADVISED THEM OF PT'S SCROTUM APPEARING RED AND SWOLLEN, PEELING SKIN NOTED TO BIL THIGHS NEAR GROIN W/PINKISH RED AREAS AROUND THEM - NO DRAINAGE AND OPEN AREA TO LEFT MID BACK - NO DRAINAGE NOTED.

## 2015-03-07 NOTE — Progress Notes (Signed)
[facility] so wife decided to take him home until [facility] bed available.  MD prepared HHC orders and ED RNCM [facility] am.  Pt's wife informed.

## 2015-03-07 NOTE — H&P (Signed)
Family Medicine Teaching [facility] Admission History and Physical Service Pager: [phone]  Patient name: [name] Medical record number: [id number] Date of birth: 22-Oct-1951 Age: 64 y.o. Gender: male  Primary Care Provider: Everlene Other, DO Consultants: None Code Status: Full  Chief Complaint: Shortness of Breath, Tactile Fevers.   Assessment and Plan: [name] is a 64 y.o. male presenting with HCAP and UTI . PMH is significant for HFrEF, CAD s/p CABG, COPD, TBI, seizures, PTSD, pancreatitis, HLD.  # HCAP / Sepsis Criteria:  Pt. present in ED awaiting SNF placement and reports sore throat and dyspnea. Febrile to 101.5 in the ED. Vital Signs stable without hypoxia or hypotension. Recent hospital admission x 2. CXR with worsening left lower lobe infiltrate. WBC of 18. Lactate 1.04. His blood pressure has been stable, but is requiring admission for treatment of HCAP with IV antibiotics.  - Admit to telemetry.  - IV Vancomycin and Zosyn per pharmacy.  - Blood cx drawn x 2 prior to administration of antibiotics.  - O2 prn to maintain sats > 90% if needed.  - Tylenol prn fevers, monitor fever curve.  - Holding off on fluids for now as he is taking PO and given his CHF. - PT/OT  # Urinary Tract Infection: Pt. With Suprapubic Tenderness, U/A with moderate leuks, nitrites, and Many Bacteria. Fever here with elevated WBC. Vital Signs stable as above and lactate of 1.04. Pt. Without urinary retention at this time.  - On Vanc and Zosyn as above.  - Continue to monitor urine output to ensure no Urinary Retention.   - consider post void residual - Monitor fever curve and urinary symptoms.  - Continue po fluids.  # Scrotal Edema / Skin Breakdown: Likely due to urinating on himself. Mild skin breakdown on his left thigh and on his scrotum. Is painful to the patient. No evidence of cellulitis.  - Barrier cream to the area.    # HFrEF: Pt. With EF of 40-45% on Echo 02/21/2015. He  has been down to 30% EF previously and was considered for an ICD. Last weight 161lb. Appears to be slightly volume up.  - Continue lasix 40mg  qod.  - Holding Lisinopril 2.5mg  daily.  - No BB.  - Strict I/O, daily weights.   # CAD s/p CABG / AAA: 6 vessel  CABG 2006. Followed by Vascular surgery for AAA - no chest pain tonight - Continue ASA - On  Pravastatin 40mg  daily.   # COPD - Not on any medications.  - Will add prn albuterol if needed.   # TBI / PTSD / Seizure Disorder - Continue home depakote.  - May require ativan prn. Has required this in the past.   # HLD : Last lipid profile with total cholesterol 79. HDL 28.  - Continue Pravastatin 40mg .   # Severe Protein Calorie Malnutrition. - Albumin of 1.5. Total protein < 6. Pt. With temporal wasting.  - Nutrition supplement with ensure. Soft diet.   FEN/GI: Soft Diet / Saline Lock Prophylaxis: Sub Q Heparin.   Disposition: Pending treatment for HCAP, UTI and Clinical Improvement.   History of Present Illness: [name] is a 64 y.o. male presenting with shortness of breath and throat pain with fever to 101.5 in the ED where he has been awaiting SNF placement. His wife brought him to the ED on 5/13 due to confusion and difficulty caring for him at home, but was not there today to give the  history. He has limited ability to communicate due to TBI. He does endorse SOB and throat pain. He does not feel like much has changed since his discharge. He has not had nausea, vomiting, or diarrhea. He has not had any more abdominal pain or chest pain. He does endorse intermittent suprapubic pain and testicular pain / penile pain (though penile pain he says was due to I/O cath). He says that his wife will be here later to talk with [place] more. He has no other complaints at this time. He says he is able to drink plenty of fluids.   He has been present in the ED since 5/13 awaiting SNF placement from the ED as his wife states that he needs more  care than she can provide at home.  He was worked up for infectious source after spiking a fever earlier today in the ED.   Review Of Systems: Per HPI with the following additions: None Otherwise 12 point review of systems was performed and was unremarkable.  Patient Active Problem List   Diagnosis Date Noted  . Pneumonia 03/07/2015  . CHF exacerbation   . Weakness 02/28/2015  . Toxic metabolic encephalopathy 02/28/2015  . Edema, peripheral   . Protein-calorie malnutrition, severe 02/24/2015  . Leukocytosis   . Acute renal failure syndrome   . Hyponatremia   . Post traumatic stress disorder   . Traumatic brain injury   . Chronic systolic congestive heart failure   . Acute pancreatitis 02/20/2015  . Sinusitis 02/18/2015  . Abdominal pain 12/03/2014  . AAA (abdominal aortic aneurysm) 11/11/2014  . S/P CABG (coronary artery bypass graft) 11/10/2014  . Orthostatic hypotension 04/10/2014  . Chronic systolic heart failure 08/21/2007  . HYPERCHOLESTEROLEMIA 12/20/2006  . ERECTILE DYSFUNCTION 12/20/2006  . TOBACCO DEPENDENCE 12/20/2006  . CORONARY, ARTERIOSCLEROSIS 12/20/2006  . GASTROESOPHAGEAL REFLUX, NO ESOPHAGITIS 12/20/2006  . CONVULSIONS, SEIZURES, NOS 12/20/2006   Past Medical History: Past Medical History  Diagnosis Date  . Seizure disorder   . Nausea   . History of heartburn   . Chronic back pain   . IHD (ischemic heart disease)   . Cigarette smoker   . Hypercholesterolemia   . CHF (congestive heart failure)     EF had been 20% initially. 2010 EF 35-40%.  . Traumatic brain injury   . Seizures   . COPD (chronic obstructive pulmonary disease)   . Pancreatitis    Past Surgical History: Past Surgical History  Procedure Laterality Date  . Cardiac catheterization  03/12/2007  . Coronary artery bypass graft  2000    x6, graft to the PDA and LAD, patent saphenous vein graft to acute marginal and posterior lateral severely diseased saphenous vein graft to diagonal.  Patent LIMA to the LAD.  Marland Kitchen Shoulder surgery      right  . Facial reconstruction surgery    . Back surgery     Social History: History  Substance Use Topics  . Smoking status: Current Every Day Smoker -- 0.75 packs/day for 50 years    Types: Cigarettes  . Smokeless tobacco: Never Used  . Alcohol Use: No   Additional social history: None  Please also refer to relevant sections of EMR.  Family History: Family History  Problem Relation Age of Onset  . Heart disease Brother   . Hypertension Brother   . Heart attack Brother   . Heart disease Sister   . Coronary artery disease Brother   . Hypertension Brother   . Heart attack Brother   .  Coronary artery disease Sister   . Diabetes Brother   . Thyroid disease Brother   . Heart attack Brother   . Diabetes Father   . Heart disease Father   . Hypertension Father   . Heart attack Father   . Hypertension Mother   . Varicose Veins Mother    Allergies and Medications: Allergies  Allergen Reactions  . Aspirin     To much aspirin make his bleed on the inside can take a small amount.  . Flomax [Tamsulosin Hcl] Swelling  . Morphine And Related Other (See Comments)    "MAKES HIM MEAN"   No current facility-administered medications on file prior to encounter.   Current Outpatient Prescriptions on File Prior to Encounter  Medication Sig Dispense Refill  . acetaminophen (TYLENOL) 650 MG CR tablet Take 650 mg by mouth every 8 (eight) hours as needed for pain.    Marland Kitchen aspirin 325 MG tablet Take 325 mg by mouth daily.      . divalproex (DEPAKOTE) 500 MG EC tablet Take 500 mg by mouth 3 (three) times daily. Prescribed by  his neurologist in [place]    . feeding supplement, ENSURE ENLIVE, (ENSURE ENLIVE) LIQD Take 237 mLs by mouth 3 (three) times daily between meals. 237 mL 12  . furosemide (LASIX) 40 MG tablet TAKE ONE TABLET BY MOUTH EVERY OTHER DAY 30 tablet 5  . lisinopril (PRINIVIL,ZESTRIL) 2.5 MG tablet Take 1 tablet (2.5 mg total)  by mouth daily. 30 tablet 0  . ranitidine (ZANTAC) 150 MG capsule Take 300 mg by mouth daily.     Marland Kitchen docusate sodium (COLACE) 100 MG capsule Take 1 capsule (100 mg total) by mouth 2 (two) times daily as needed for mild constipation. (Patient not taking: Reported on 02/20/2015) 30 capsule 0  . ondansetron (ZOFRAN) 4 MG tablet Take 1 tablet (4 mg total) by mouth every 8 (eight) hours as needed for nausea or vomiting. (Patient not taking: Reported on 02/20/2015) 20 tablet 0  . oxyCODONE-acetaminophen (PERCOCET/ROXICET) 5-325 MG per tablet Take 1 tablet by mouth every 6 (six) hours as needed for severe pain. (Patient not taking: Reported on 02/20/2015) 15 tablet 0  . pravastatin (PRAVACHOL) 40 MG tablet Take 1 tablet (40 mg total) by mouth daily. (Patient not taking: Reported on 03/05/2015) 90 tablet 3  . tamsulosin (FLOMAX) 0.4 MG CAPS capsule Take 1 capsule (0.4 mg total) by mouth daily. (Patient not taking: Reported on 03/05/2015) 30 capsule 2    Objective: BP 105/58 mmHg  Pulse 80  Temp(Src) 100 F (37.8 C) (Rectal)  Resp 20  SpO2 93% Exam: General: NAD, AAOx3, Cachectic, laying in bed, slow responses to questions Eyes: PERRLA, EOMI Neck: FROM Cardiovascular: RRR, No MGR, Normal S1/S2, 2+ distal pulses.  Respiratory: Diminished at the left base with mild crackles, poor air movement in all other lung fields. Appropriate rate, unlabored Abdomen: S, mild suprapubic tenderness to palpation, ND, +BS.  MSK: MAEW, no TTP.  Ext : 1+ edema on dependent area of arms, no LE edema or dependent edema of torso Skin: No rashes, skin breakdown on scrotum and inner left thigh with some redness without evidence of infection.  Neuro: AAOx2, No focal deficits, 5/5 motor, no sensory deficits.  Psych: Affect appropriate given TBI.   Labs and Imaging: CBC BMET   Recent Labs Lab 03/07/15 1542  WBC 18.7*  HGB 10.1*  HCT 29.7*  PLT 185    Recent Labs Lab 03/07/15 1542  NA 132*  K  3.5  CL 94*  CO2 29   BUN 13  CREATININE 1.15  GLUCOSE 86  CALCIUM 8.0*     Lactic Acid - 1.04.   Urinalysis    Component Value Date/Time   COLORURINE AMBER* 03/07/2015 1532   APPEARANCEUR CLOUDY* 03/07/2015 1532   LABSPEC 1.022 03/07/2015 1532   PHURINE 6.0 03/07/2015 1532   GLUCOSEU NEGATIVE 03/07/2015 1532   HGBUR MODERATE* 03/07/2015 1532   BILIRUBINUR SMALL* 03/07/2015 1532   KETONESUR 15* 03/07/2015 1532   PROTEINUR 30* 03/07/2015 1532   UROBILINOGEN 2.0* 03/07/2015 1532   NITRITE POSITIVE* 03/07/2015 1532   LEUKOCYTESUR MODERATE* 03/07/2015 1532    IMPRESSION: Worsening left base opacification likely small to moderate effusion with associated atelectasis as cannot exclude infection in the left base.    Yolande Jolly, MD 03/07/2015, 7:31 PM PGY-1, [facility] FPTS Intern pager: [phone], text pages welcome   I have seen and examined the patient with Dr. Jaquita Rector and I agree with his documentation above. My annotations are in blue.   Murtis Sink, MD [facility] Resident, PGY-3 03/07/2015, 9:44 PM

## 2015-03-08 DIAGNOSIS — J189 Pneumonia, unspecified organism: Secondary | ICD-10-CM

## 2015-03-08 DIAGNOSIS — N3000 Acute cystitis without hematuria: Secondary | ICD-10-CM | POA: Insufficient documentation

## 2015-03-08 DIAGNOSIS — N390 Urinary tract infection, site not specified: Secondary | ICD-10-CM

## 2015-03-08 DIAGNOSIS — F4310 Post-traumatic stress disorder, unspecified: Secondary | ICD-10-CM

## 2015-03-08 DIAGNOSIS — E780 Pure hypercholesterolemia: Secondary | ICD-10-CM

## 2015-03-08 DIAGNOSIS — R4182 Altered mental status, unspecified: Secondary | ICD-10-CM

## 2015-03-08 DIAGNOSIS — N509 Disorder of male genital organs, unspecified: Secondary | ICD-10-CM

## 2015-03-08 DIAGNOSIS — S069X0S Unspecified intracranial injury without loss of consciousness, sequela: Secondary | ICD-10-CM

## 2015-03-08 DIAGNOSIS — I5022 Chronic systolic (congestive) heart failure: Secondary | ICD-10-CM

## 2015-03-08 DIAGNOSIS — A419 Sepsis, unspecified organism: Secondary | ICD-10-CM | POA: Diagnosis not present

## 2015-03-08 DIAGNOSIS — E43 Unspecified severe protein-calorie malnutrition: Secondary | ICD-10-CM

## 2015-03-08 LAB — CBC
HCT: 26.8 % — ABNORMAL LOW (ref 39.0–52.0)
HEMOGLOBIN: 9 g/dL — AB (ref 13.0–17.0)
MCH: 32.7 pg (ref 26.0–34.0)
MCHC: 33.6 g/dL (ref 30.0–36.0)
MCV: 97.5 fL (ref 78.0–100.0)
PLATELETS: 180 10*3/uL (ref 150–400)
RBC: 2.75 MIL/uL — AB (ref 4.22–5.81)
RDW: 13.5 % (ref 11.5–15.5)
WBC: 17.2 10*3/uL — ABNORMAL HIGH (ref 4.0–10.5)

## 2015-03-08 LAB — INFLUENZA PANEL BY PCR (TYPE A & B)
H1N1 flu by pcr: NOT DETECTED
Influenza A By PCR: NEGATIVE
Influenza B By PCR: NEGATIVE

## 2015-03-08 LAB — BASIC METABOLIC PANEL
Anion gap: 9 (ref 5–15)
BUN: 12 mg/dL (ref 6–20)
CALCIUM: 7.8 mg/dL — AB (ref 8.9–10.3)
CO2: 27 mmol/L (ref 22–32)
Chloride: 95 mmol/L — ABNORMAL LOW (ref 101–111)
Creatinine, Ser: 1.1 mg/dL (ref 0.61–1.24)
GFR calc Af Amer: >60 mL/min (ref >60)
GFR calc non Af Amer: >60 mL/min (ref >60)
Glucose, Bld: 76 mg/dL (ref 65–99)
POTASSIUM: 3.2 mmol/L — AB (ref 3.5–5.1)
SODIUM: 131 mmol/L — AB (ref 135–145)

## 2015-03-08 LAB — STREP PNEUMONIAE URINARY ANTIGEN: STREP PNEUMO URINARY ANTIGEN: NEGATIVE

## 2015-03-08 MED ORDER — POTASSIUM CHLORIDE CRYS ER 20 MEQ PO TBCR
40.0000 meq | EXTENDED_RELEASE_TABLET | Freq: Once | ORAL | Status: AC
  Administered 2015-03-08: 40 meq via ORAL
  Filled 2015-03-08: qty 2

## 2015-03-08 MED ORDER — ADULT MULTIVITAMIN W/MINERALS CH
1.0000 | ORAL_TABLET | Freq: Every day | ORAL | Status: DC
  Administered 2015-03-08 – 2015-03-10 (×3): 1 via ORAL
  Filled 2015-03-08 (×3): qty 1

## 2015-03-08 MED ORDER — PIPERACILLIN-TAZOBACTAM 3.375 G IVPB
3.3750 g | Freq: Three times a day (TID) | INTRAVENOUS | Status: DC
  Administered 2015-03-08 – 2015-03-09 (×4): 3.375 g via INTRAVENOUS
  Filled 2015-03-08 (×5): qty 50

## 2015-03-08 NOTE — Progress Notes (Signed)
Initial Nutrition Assessment  DOCUMENTATION CODES:  Not applicable  INTERVENTION:  Snacks, Magic cup  NUTRITION DIAGNOSIS:  Predicted suboptimal nutrient intake related to lethargy/confusion, acute illness as evidenced by meal completion < 50%.   GOAL:  Patient will meet greater than or equal to 90% of their needs   MONITOR:  PO intake, Labs, Weight trends, Skin, I & O's  REASON FOR ASSESSMENT:  Malnutrition Screening Tool    ASSESSMENT: 64 y.o. male presenting with HCAP and UTI . PMH is significant for HFrEF, CAD s/p CABG, COPD, TBI, seizures, PTSD, pancreatitis, HLD.  Awoke pt at time of visit but, pt remained lethargic and difficult to assess. He reports eating well PTA. States he doesn't like Ensure. Per pt, his usually body weight is around 145 lbs. Pt unable to wake up enough for full physical exam; severe muscle wasting notice in leg and moderate muscle wasting of clavicles and temporal region. No evidence of weight loss per weight history. Per nursing notes pt ate 25% of breakfast and 25% of lunch. Pt agreeable to receiving snacks.   Labs: Low hemoglobin, low potassium, low calcium and low phosphorus.   Height:  Ht Readings from Last 1 Encounters:  03/07/15 6\' 1"  (1.854 m)    Weight:  Wt Readings from Last 1 Encounters:  03/08/15 158 lb 8.2 oz (71.9 kg)    Ideal Body Weight:  83.6 kg  Wt Readings from Last 10 Encounters:  03/08/15 158 lb 8.2 oz (71.9 kg)  02/28/15 161 lb 9.6 oz (73.3 kg)  02/26/15 159 lb 13.3 oz (72.5 kg)  02/17/15 149 lb (67.586 kg)  01/06/15 152 lb 1.6 oz (68.992 kg)  01/01/15 153 lb 12.8 oz (69.763 kg)  12/03/14 154 lb 4.8 oz (69.99 kg)  11/10/14 155 lb (70.308 kg)  04/10/14 148 lb (67.132 kg)  03/30/14 154 lb (69.854 kg)    BMI:  Body mass index is 20.92 kg/(m^2).  Estimated Nutritional Needs:  Kcal:  1900-2100  Protein:  100-115 grams  Fluid:  1.9-2.1 L/day  Skin:  Reviewed, no issues  Diet Order:  DIET SOFT Room  service appropriate?: Yes; Fluid consistency:: Thin  EDUCATION NEEDS:  No education needs identified at this time   Intake/Output Summary (Last 24 hours) at 03/08/15 1354 Last data filed at 03/08/15 1335  Gross per 24 hour  Intake    908 ml  Output    325 ml  Net    583 ml    Last BM:  5/14  Ian Malkineanne Barnett RD, LDN Inpatient Clinical Dietitian Pager: [phone] After Hours Pager: [phone]

## 2015-03-08 NOTE — Progress Notes (Signed)
ANTIBIOTIC CONSULT NOTE - Follow-up  Pharmacy Consult for Vancomycin, zosyn Indication: pneumonia  Allergies  Allergen Reactions  . Aspirin     To much aspirin make his bleed on the inside can take a small amount.  . Flomax [Tamsulosin Hcl] Swelling  . Morphine And Related Other (See Comments)    "MAKES HIM MEAN"    Patient Measurements: Height: 6\' 1"  (185.4 cm) Weight: 158 lb 8.2 oz (71.9 kg) IBW/kg (Calculated) : 79.9 Adjusted Body Weight: n/a   Vital Signs: Temp: 98.5 F (36.9 C) (05/16 0553) Temp Source: Oral (05/16 0553) BP: 109/63 mmHg (05/16 0553) Pulse Rate: 69 (05/16 0553) Intake/Output from previous day: 05/15 0701 - 05/16 0700 In: 730 [P.O.:180; IV Piggyback:550] Out: 1425 [Urine:1425] Intake/Output from this shift:    Labs:  Recent Labs  03/05/15 1121 03/07/15 1542 03/08/15 0248  WBC 17.8* 18.7* 17.2*  HGB 9.5* 10.1* 9.0*  PLT 131* 185 180  CREATININE 1.20 1.15 1.10   Estimated Creatinine Clearance: 68.1 mL/min (by C-G formula based on Cr of 1.1). No results for input(s): VANCOTROUGH, VANCOPEAK, VANCORANDOM, GENTTROUGH, GENTPEAK, GENTRANDOM, TOBRATROUGH, TOBRAPEAK, TOBRARND, AMIKACINPEAK, AMIKACINTROU, AMIKACIN in the last 72 hours.   Microbiology: Recent Results (from the past 720 hour(s))  Blood Culture (routine x 2)     Status: None   Collection Time: 02/20/15  3:15 PM  Result Value Ref Range Status   Specimen Description BLOOD RIGHT HAND  Final   Special Requests BOTTLES DRAWN AEROBIC ONLY 2CC  Final   Culture   Final    NO GROWTH 5 DAYS Performed at [facility]    Report Status 02/27/2015 FINAL  Final  Blood Culture (routine x 2)     Status: None   Collection Time: 02/20/15  3:20 PM  Result Value Ref Range Status   Specimen Description BLOOD LEFT ANTECUBITAL  Final   Special Requests BOTTLES DRAWN AEROBIC AND ANAEROBIC 5CC  Final   Culture   Final    NO GROWTH 5 DAYS Performed at [facility]    Report Status  02/27/2015 FINAL  Final  Urine culture     Status: None   Collection Time: 02/20/15  4:14 PM  Result Value Ref Range Status   Specimen Description URINE, CATHETERIZED  Final   Special Requests NONE  Final   Colony Count NO GROWTH Performed at [facility]   Final   Culture NO GROWTH Performed at [facility]   Final   Report Status 02/21/2015 FINAL  Final  MRSA PCR Screening     Status: None   Collection Time: 02/20/15  6:37 PM  Result Value Ref Range Status   MRSA by PCR NEGATIVE NEGATIVE Final    Comment:        The GeneXpert MRSA Assay (FDA approved for NASAL specimens only), is one component of a comprehensive MRSA colonization surveillance program. It is not intended to diagnose MRSA infection nor to guide or monitor treatment for MRSA infections.   Culture, blood (routine x 2)     Status: None   Collection Time: 02/28/15  1:38 AM  Result Value Ref Range Status   Specimen Description BLOOD LEFT ANTECUBITAL  Final   Special Requests BOTTLES DRAWN AEROBIC ONLY 7CC  Final   Culture   Final    NO GROWTH 5 DAYS Performed at [facility]    Report Status 03/06/2015 FINAL  Final  Culture, blood (routine x 2)     Status: None  Collection Time: 02/28/15  1:42 AM  Result Value Ref Range Status   Specimen Description BLOOD RIGHT ANTECUBITAL  Final   Special Requests BOTTLES DRAWN AEROBIC ONLY 10CC  Final   Culture   Final    NO GROWTH 5 DAYS Performed at [facility]    Report Status 03/06/2015 FINAL  Final  Culture, blood (routine x 2)     Status: None (Preliminary result)   Collection Time: 03/07/15  6:02 PM  Result Value Ref Range Status   Specimen Description BLOOD LEFT WRIST  Final   Special Requests BOTTLES DRAWN AEROBIC AND ANAEROBIC 5CC  Final   Culture   Final           BLOOD CULTURE RECEIVED NO GROWTH TO DATE CULTURE WILL BE HELD FOR 5 DAYS BEFORE ISSUING A FINAL NEGATIVE REPORT Performed at [facility]    Report  Status PENDING  Incomplete  Culture, blood (routine x 2)     Status: None (Preliminary result)   Collection Time: 03/07/15  6:03 PM  Result Value Ref Range Status   Specimen Description BLOOD RIGHT HAND  Final   Special Requests BOTTLES DRAWN AEROBIC AND ANAEROBIC 5CC  Final   Culture   Final           BLOOD CULTURE RECEIVED NO GROWTH TO DATE CULTURE WILL BE HELD FOR 5 DAYS BEFORE ISSUING A FINAL NEGATIVE REPORT Performed at [facility]    Report Status PENDING  Incomplete   Assessment: [age] YOM with AMS continue on vancomycin for treatment of pneumonia. Zosyn added. Renal function is good. Tmax is 101.5, WBC is elevated at 17.2. Strep pneumo is negative.   Vanc 5/15>> Zosyn 5/15>>  5/15 Blood - NGTD 2/2 5/15 Urine - pending  Goal of Therapy:  Vancomycin trough level 15-20 mcg/ml  Plan:  - Continue vanc 1gm IV Q12H - Zosyn 3.375gm IV Q8H (4 hr inf) - F/u renal fxn, C&S, clinical status and [facility]  Sorah Falkenstein, PharmD, BCPS Pager # [phone] 03/08/2015 8:49 AM

## 2015-03-08 NOTE — Progress Notes (Signed)
Family Medicine Teaching Service Daily Progress Note Intern Pager: [phone]  Patient name: [name] Medical record number: [id number] Date of birth: Apr 15, 1951 Age: 64 y.o. Gender: male  Primary Care Provider: Everlene Other, DO Consultants: None Code Status: Full  Pt Overview and Major Events to Date:  5/15-5/16: Pt. Admitted with HCAP, UTI meeting Sepsis Criteria.   Assessment and Plan: [name] is a 64 y.o. male presenting with HCAP and UTI . PMH is significant for HFrEF, CAD s/p CABG, COPD, TBI, seizures, PTSD, pancreatitis, HLD.   # HCAP / Sepsis Criteria: CXR with worsening left lower lobe infiltrate. Febrile initially. WBC of 18 now to 17.2. Lactate 1.04 in the ED. Vital signs have been stable. He has been afebrile for > 18 hours. His mental status is stable. Continuing IV antibiotics until afebrile / cultures negative for 24-48 hours.  - Admitted to telemetry.  - IV Vancomycin and Zosyn per pharmacy.  - Blood cx pending. NG x 18 hours.  - O2 prn to maintain sats > 90% if needed.  - Tylenol prn fevers, continue to monitor fever curve.  - Holding off on fluids for now as he is taking PO and given his CHF. - PT/OT - Deescelate abx to po when afebrile / cx negative x 24 - 48 hours and clinically improving.   # Urinary Tract Infection: Pt. With continued Suprapubic Tenderness, U/A with moderate leuks, nitrites, and Many Bacteria. Fever here with elevated WBC that is now trending down. He has been afebrile as above as well.  Condom cath in place. - On Vanc and Zosyn as above.  - Continue to monitor urine output to ensure no Urinary Retention.  - Condom Cath in place.  - Monitor fever curve and urinary symptoms.  - Continue po fluids.   # Scrotal Edema / Skin Breakdown: Likely due to urinating on himself. Mild skin breakdown on his left thigh and on his scrotum. No evidence of cellulitis.  - Continue daily barrier cream to the area.   # HFrEF: Pt. With EF of  40-45% on Echo 02/21/2015. He has been down to 30% EF previously and was considered for an ICD.  - Continue lasix 40mg  qod.  - Holding Lisinopril 2.5mg  daily.  - No BB.  - Strict I/O, daily weights.  - Weight is 158lb from 161lb previously. Continue to trend weights.   # CAD s/p CABG / AAA: 6 vessel CABG 2006. Followed by Vascular surgery for AAA - Continue ASA - On Pravastatin 40mg  daily.   # COPD - Not on any medications.  - Will add prn albuterol if needed.   # TBI / PTSD / Seizure Disorder - Last Depakote level appropriate.  - Continue home depakote.  - May require ativan prn. Has required this in the past.   # HLD : Last lipid profile with total cholesterol 79. HDL 28.  - Continue Pravastatin 40mg .   # Severe Protein Calorie Malnutrition. - Albumin of 1.5. Total protein < 6. Pt. With temporal wasting.  - Nutrition supplement with ensure. Soft diet.   FEN/GI: Soft Diet / Saline Lock Prophylaxis: Sub Q Heparin.   Disposition: To SNF  Subjective:  No acute events overnight. Pt. Has remained afebrile since starting antibiotics, though he says that he has chills this am. He denies further abdominal pain or suprapubic tenderness. He does have somewhat mumbled speech near his baseline this am. He says that he has been drinking fluids and has three empty  glasses in front of him. He has no more burning with peeing or pain in his scrotum / penis. He has no shortness of breath or chest pain today. He is waiting for his wife to come and meet him today. He is ready to go to SNF whenever it is time.   Objective: Temp:  [98 F (36.7 C)-101.5 F (38.6 C)] 98.5 F (36.9 C) (05/16 0553) Pulse Rate:  [69-84] 69 (05/16 0553) Resp:  [14-22] 16 (05/16 0553) BP: (95-113)/(52-70) 109/63 mmHg (05/16 0553) SpO2:  [93 %-97 %] 94 % (05/16 0553) Weight:  [158 lb 4.6 oz (71.8 kg)-158 lb 8.2 oz (71.9 kg)] 158 lb 8.2 oz (71.9 kg) (05/16 0553) Physical Exam: General: NAD,  AAOx2 Cardiovascular: RRR, No MGR, Normal S1/S2, 2+ distal pulses.  Respiratory: Slight crackles / mildly decreased BS in the LL base, Appropriate rate, unlabored.  Abdomen: S, ND, +BS, No Suprapubic tenderness this am.  Extremities: WWP, 2+ distal pulses. No peripheral edema. MAEW.  Neuro: AAOx2, Name, and Location oriented. Otherwise no focal deficits.  Psych: Appropriate affect. Stable.  Skin: Scrotal Edema / Erythema continues. Condom cath in place.   Laboratory:  Recent Labs Lab 03/05/15 1121 03/07/15 1542 03/08/15 0248  WBC 17.8* 18.7* 17.2*  HGB 9.5* 10.1* 9.0*  HCT 28.0* 29.7* 26.8*  PLT 131* 185 180    Recent Labs Lab 03/05/15 1121 03/07/15 1542 03/08/15 0248  NA 132* 132* 131*  K 3.2* 3.5 3.2*  CL 96* 94* 95*  CO2 26 29 27   BUN 14 13 12   CREATININE 1.20 1.15 1.10  CALCIUM 8.2* 8.0* 7.8*  PROT 4.7*  --   --   BILITOT 0.3  --   --   ALKPHOS 55  --   --   ALT 8*  --   --   AST 20  --   --   GLUCOSE 89 86 76   Lactic Acid - 1.04.   Imaging/Diagnostic Tests: CXR 5/15:   IMPRESSION: Worsening left base opacification likely small to moderate effusion with associated atelectasis as cannot exclude infection in the left base.  Yolande Jolly, MD 03/08/2015, 8:16 AM PGY-1, [facility] Family Medicine FPTS Intern pager: [phone], text pages welcome

## 2015-03-08 NOTE — Progress Notes (Signed)
Physical Therapy Treatment Patient Details Name: CONNERY NICKELL MRN: [id number] DOB: May 09, 1951 Today's Date: 03/08/2015    History of Present Illness Pt brought to ED by wife for AMS x 14 days. 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    PT Comments    Pt lethargic today, but was able to sit EOB, but then returned supine due to dizziness.  Pt unable to stand or ambulate today.  Nursing informed.  Continue to recommend SNF.  Follow Up Recommendations  SNF;Supervision/Assistance - 24 hour     Equipment Recommendations  None recommended by PT    Recommendations for Other Services       Precautions / Restrictions Precautions Precautions: Fall Restrictions Weight Bearing Restrictions: No    Mobility  Bed Mobility Overal bed mobility: Needs Assistance Bed Mobility: Supine to Sit;Sit to Supine     Supine to sit: Mod assist;HOB elevated Sit to supine: Min assist   General bed mobility comments: A to get trunk upright for supine > sit and for legs for sit > supine.  Pt c/o dizziness upon sitting and would not sit up any more.  Pt was able to help scoot self up in bed and re-position self.  Transfers                 General transfer comment: Pt refused to stand.  Ambulation/Gait             General Gait Details: not performed today, due to pt dizziness and lethargy.   Stairs            Wheelchair Mobility    Modified Rankin (Stroke Patients Only)       Balance     Sitting balance-Leahy Scale: Fair Sitting balance - Comments: impulsively layed back down due to dizziness.                            Cognition Arousal/Alertness: Lethargic Behavior During Therapy: Flat affect Overall Cognitive Status: No family/caregiver present to determine baseline cognitive functioning Area of Impairment: Attention;Following commands;Safety/judgement;Problem solving       Following  Commands: Follows one step commands with increased time Safety/Judgement: Decreased awareness of deficits   Problem Solving: Slow processing;Decreased initiation;Requires verbal cues General Comments: It took several mintues to get pt to wake up and was still lethargic.    Exercises      General Comments        Pertinent Vitals/Pain Faces Pain Scale: Hurts a little bit Pain Location: arm from laying on it Pain Descriptors / Indicators: Grimacing Pain Intervention(s): Monitored during session    Home Living                      Prior Function            PT Goals (current goals can now be found in the care plan section) Acute Rehab PT Goals Patient Stated Goal: non stsated PT Goal Formulation: With patient Time For Goal Achievement: 03/16/15 Potential to Achieve Goals: Fair Progress towards PT goals: Not progressing toward goals - comment (Pt unable to ambulate today.  C/o dizziness and lethargic.)    Frequency  Min 3X/week    PT Plan Current plan remains appropriate    Co-evaluation             End of Session   Activity Tolerance: Patient limited by  lethargy;Patient limited by fatigue Patient left: in bed;with call bell/phone within reach;Other (comment) (with lunch tray)     Time: 3557-3220 PT Time Calculation (min) (ACUTE ONLY): 12 min  Charges:  $Therapeutic Activity: 8-22 mins                    G Codes:      Ellawyn Wogan LUBECK 03/08/2015, 12:59 PM

## 2015-03-09 DIAGNOSIS — N390 Urinary tract infection, site not specified: Secondary | ICD-10-CM

## 2015-03-09 LAB — BASIC METABOLIC PANEL
Anion gap: 11 (ref 5–15)
BUN: 8 mg/dL (ref 6–20)
CO2: 30 mmol/L (ref 22–32)
Calcium: 7.9 mg/dL — ABNORMAL LOW (ref 8.9–10.3)
Chloride: 93 mmol/L — ABNORMAL LOW (ref 101–111)
Creatinine, Ser: 1.22 mg/dL (ref 0.61–1.24)
Glucose, Bld: 95 mg/dL (ref 65–99)
POTASSIUM: 3.2 mmol/L — AB (ref 3.5–5.1)
Sodium: 134 mmol/L — ABNORMAL LOW (ref 135–145)

## 2015-03-09 LAB — CBC
HCT: 26.2 % — ABNORMAL LOW (ref 39.0–52.0)
Hemoglobin: 8.9 g/dL — ABNORMAL LOW (ref 13.0–17.0)
MCH: 33.5 pg (ref 26.0–34.0)
MCHC: 34 g/dL (ref 30.0–36.0)
MCV: 98.5 fL (ref 78.0–100.0)
Platelets: 199 10*3/uL (ref 150–400)
RBC: 2.66 MIL/uL — ABNORMAL LOW (ref 4.22–5.81)
RDW: 13.5 % (ref 11.5–15.5)
WBC: 12.1 10*3/uL — AB (ref 4.0–10.5)

## 2015-03-09 LAB — URINE CULTURE

## 2015-03-09 LAB — LEGIONELLA ANTIGEN, URINE

## 2015-03-09 LAB — GLUCOSE, CAPILLARY: Glucose-Capillary: 79 mg/dL (ref 65–99)

## 2015-03-09 MED ORDER — LEVOFLOXACIN 750 MG PO TABS
750.0000 mg | ORAL_TABLET | Freq: Every day | ORAL | Status: DC
Start: 2015-03-09 — End: 2015-03-10
  Administered 2015-03-09: 750 mg via ORAL
  Filled 2015-03-09 (×3): qty 1

## 2015-03-09 MED ORDER — POTASSIUM CHLORIDE CRYS ER 20 MEQ PO TBCR
40.0000 meq | EXTENDED_RELEASE_TABLET | Freq: Once | ORAL | Status: AC
  Administered 2015-03-09: 40 meq via ORAL
  Filled 2015-03-09: qty 2

## 2015-03-09 MED ORDER — ACETAMINOPHEN 500 MG PO TABS
500.0000 mg | ORAL_TABLET | Freq: Four times a day (QID) | ORAL | Status: DC | PRN
  Administered 2015-03-09 – 2015-03-10 (×2): 500 mg via ORAL
  Filled 2015-03-09 (×2): qty 1

## 2015-03-09 NOTE — Progress Notes (Signed)
Family Medicine Teaching Service Daily Progress Note Intern Pager: [phone]  Patient name: [name] Medical record number: [id number] Date of birth: 1951/03/03 Age: 64 y.o. Gender: male  Primary Care Provider: Everlene Other, DO Consultants: None Code Status: Full  Pt Overview and Major Events to Date:  5/15-5/16: Pt. Admitted with HCAP, UTI meeting Sepsis Criteria.   Assessment and Plan: [name] is a 64 y.o. male presenting with HCAP and UTI . PMH is significant for HFrEF, CAD s/p CABG, COPD, TBI, seizures, PTSD, pancreatitis, HLD.   # HCAP: CXR with evidence of pneumonia. Febrile initially. WBC of 18 now to 12. RVP negative, Strep pneumo Urine ag negative. Vital signs have been stable. He has been afebrile for > 36 hours. His mental status is stable. Will deescalate antibiotics today.  - Admitted to telemetry.  - Discontinue IV antibiotics and transition to PO levaquin today to cover urinary and pulmonary pathogens.  - Levaquin 750mg  qd 10 days. End Date 5/27.  - Blood cx pending. NG x 36 hours.  - O2 prn to maintain sats > 90% if needed.  - Tylenol prn fevers, continue to monitor fever curve.  - Continue to hold fluids - PT/OT > Recommend SNF - May consider Palliative Care consult given his poor prognosis for goals of care with regard to his heart failure and his / wife's wishes for end of life care.   # Urinary Tract Infection: Suprapubic tenderness improving. U/A with moderate leuks, nitrites, and Many Bacteria. Urine CX with >100,000 CFU's GNR's.  - On Vanc and Zosyn as above.  - Continue to monitor urine output to ensure no Urinary Retention.  - Condom Cath in place.  - Monitor fever curve and urinary symptoms.  - Continue po fluids.   # Scrotal Edema / Skin Breakdown: Condom cath in place. Mild skin breakdown on his left thigh and on his scrotum. Improving. No evidence of cellulitis.  - Continue daily barrier cream to the area.   # Hypokalemia - Down  to 3.2 for two days s/p repletion x 1.  - Replete with oral potassium again. 40 meq.  - Follow up am BMET.   # HFrEF: Pt. With EF of 40-45% on Echo 02/21/2015. He has been down to 30% EF previously and was considered for an ICD.  - Continue lasix 40mg  qod.  - Holding Lisinopril 2.5mg  daily.  - No BB.  - Strict I/O, daily weights.  - Weight is 151lb from 161lb previously. Continue to trend weights.   # CAD s/p CABG / AAA: 6 vessel CABG 2006. Followed by Vascular surgery for AAA - Continue ASA - On Pravastatin 40mg  daily.   # COPD - Not on any medications.  - Will add prn albuterol if needed.   # TBI / PTSD / Seizure Disorder - Last Depakote level appropriate.  - Continue home depakote.  - May require ativan prn. Has required this in the past.   # HLD : Last lipid profile with total cholesterol 79. HDL 28.  - Continue Pravastatin 40mg .   # Severe Protein Calorie Malnutrition. - Albumin of 1.5. Total protein < 6. Pt. With temporal wasting. Phos 2.4,Calcium corrects to 9.4.  - Nutrition supplement with ensure. Soft diet.  Administrator, [employer].   FEN/GI: Soft Diet / Saline Lock Prophylaxis: Sub Q Heparin.   Disposition: To SNF  Subjective:   No acute events overnight. Continues to await SNF placement. Mild headache this am. Suprapubic tenderness improving. No  nausea or vomiting. No chest pain. No SOB.   Objective: Temp:  [98.1 F (36.7 C)-99.1 F (37.3 C)] 98.1 F (36.7 C) (05/17 0625) Pulse Rate:  [80-83] 82 (05/17 0625) Resp:  [17-18] 17 (05/17 0625) BP: (109-122)/(59-71) 110/62 mmHg (05/17 0625) SpO2:  [95 %-96 %] 96 % (05/17 0625) Weight:  [151 lb 0.2 oz (68.5 kg)] 151 lb 0.2 oz (68.5 kg) (05/17 1610) Physical Exam: General: NAD, AAOx2 Cardiovascular: RRR, No MGR, Normal S1/S2, 2+ distal pulses.  Respiratory: No crackles, slightly decreased BS mostly due to poor effort., Appropriate rate, unlabored.  Abdomen: S, ND, +BS, No suprapubic tenderness, no organomegaly.    Extremities: WWP, 2+ distal pulses. No peripheral edema. MAEW.  Neuro: AAOx2, Oriented to person, place. No other focal deficits.  Psych: Appropriate affect. Stable.  Skin: Scrotal edema / erythema improving. No signs of infection.   Laboratory:  Recent Labs Lab 03/07/15 1542 03/08/15 0248 03/09/15 0239  WBC 18.7* 17.2* 12.1*  HGB 10.1* 9.0* 8.9*  HCT 29.7* 26.8* 26.2*  PLT 185 180 199    Recent Labs Lab 03/05/15 1121 03/07/15 1542 03/08/15 0248 03/09/15 0239  NA 132* 132* 131* 134*  K 3.2* 3.5 3.2* 3.2*  CL 96* 94* 95* 93*  CO2 26 29 27 30   BUN 14 13 12 8   CREATININE 1.20 1.15 1.10 1.22  CALCIUM 8.2* 8.0* 7.8* 7.9*  PROT 4.7*  --   --   --   BILITOT 0.3  --   --   --   ALKPHOS 55  --   --   --   ALT 8*  --   --   --   AST 20  --   --   --   GLUCOSE 89 86 76 95   Lactic Acid - 1.04.   Imaging/Diagnostic Tests: CXR 5/15:   IMPRESSION: Worsening left base opacification likely small to moderate effusion with associated atelectasis as cannot exclude infection in the left base.  Yolande Jolly, MD 03/09/2015, 9:11 AM PGY-1, [place] [facility] FPTS Intern pager: [phone], text pages welcome

## 2015-03-09 NOTE — Clinical Social Work Placement (Signed)
CLINICAL SOCIAL WORK PLACEMENT  NOTE  Date:  03/09/2015  Patient Details  Name: [name] MRN: [id number] Date of Birth: [birth year]  Clinical Social Work is seeking post-discharge placement for this patient at the Skilled  Nursing Facility level of care (*CSW will initial, date and re-position this form in  chart as items are completed):  Yes   Patient/family provided with [facility] Clinical Social Work Department's list of facilities offering this level of care within the geographic area requested by the patient (or if unable, by the patient's family).  Yes   Patient/family informed of their freedom to choose among providers that offer the needed level of care, that participate in Medicare, Medicaid or managed care program needed by the patient, have an available bed and are willing to accept the patient.  Yes   Patient/family informed of [facility]'s ownership interest in [facility] and [facility], as well as of the fact that they are under no obligation to receive care at these facilities.  PASRR submitted to EDS on 03/09/15     PASRR number received on 03/09/15     Existing PASRR number confirmed on       FL2 transmitted to all facilities in geographic area requested by pt/family on 03/09/15     FL2 transmitted to all facilities within larger geographic area on       Patient informed that his/her managed care company has contracts with or will negotiate with certain facilities, including the following:   (NA)     Yes   Patient/family informed of bed offers received.  Patient chooses bed at       Physician recommends and patient chooses bed at      Patient to be transferred to   on  .  Patient to be transferred to facility by       Patient family notified on   of transfer.  Name of family member notified:        PHYSICIAN Please sign FL2, Please prepare priority discharge summary, including medications, Please prepare prescriptions      Additional Comment:    _______________________________________________ Darylene Price, LCSW 03/09/2015, 4:17 PM

## 2015-03-09 NOTE — Care Management Note (Signed)
Case Management Note  Patient Details  Name: [name] MRN: [id number] Date of Birth: 03-22-1951  Subjective/Objective:     Pt admitted on 03/05/15 with HCAP, AMS.  PTA, pt resided at home with spouse.                 Action/Plan: PT recommending SNF at dc for rehab.  CSW consulted to facilitate dc to SNF when medically stable. Will follow progress.    Expected Discharge Date:                  Expected Discharge Plan:  Skilled Nursing Facility  In-House Referral:  Clinical Social Work  Discharge planning Services  CM Consult  Post Acute Care Choice:    Choice offered to:     DME Arranged:    DME Agency:     HH Arranged:    HH Agency:     Status of Service:  In process, will continue to follow  Medicare Important Message Given:    Date Medicare IM Given:    Medicare IM give by:    Date Additional Medicare IM Given:    Additional Medicare Important Message give by:     If discussed at Long Length of Stay Meetings, dates discussed:    Additional Comments:  [name], Sankalp Ferrell M, RN 03/09/2015, 4:49 PM Phone #[phone]

## 2015-03-09 NOTE — Progress Notes (Signed)
Patient's wife has received bed offers and is touring several facilities to determine if she will seek SNF placement. She is very conflicted about SNF as she has always provided care for him at home- however recognizes that patient's care may be more than she can manage at this time. Wife expresses feelings of grief and sadness stating "my husband is very mad at me for considering the nursing home.  He wants to go home with home health."  CSW provided support and will follow up with her in the morning to determine d/c decision.  Fl2 placed on chart for MD's signature.  Per MD- patient will be medically stable for d/c tomorrow.  Lorri Frederickonna T. Jaci LazierCrowder, KentuckyLCSW [phone]

## 2015-03-09 NOTE — Clinical Social Work Note (Signed)
Clinical Social Work Assessment  Patient Details  Name: [name] MRN: [id number] Date of Birth: 1951/06/30  Date of referral:  03/09/15               Reason for consult:  Facility Placement                Permission sought to share information with:  Family Supports Permission granted to share information::  Yes, Verbal Permission Granted  Name::     Wife and [place] SNFS       Housing/Transportation Living arrangements for the past 2 months:  Single Family Home Source of Information:  Patient, Spouse Patient Interpreter Needed:  None Criminal Activity/Legal Involvement Pertinent to Current Situation/Hospitalization:  No - Comment as needed Significant Relationships:  Spouse, Adult Children Lives with:  Spouse Do you feel safe going back to the place where you live?  Yes  Need for family participation in patient care:  Yes (Comment)  Care giving concerns:  Patient lives at home with his wife who has provided care giving services on and off for over 25 years.  Patient wants to go home and feels that he can manage.  Physical Therapy recommends short term SNF.  Patient's wife is unsure if she can manage his care at home at this time. He has not been able to walk.    Social Worker assessment / plan:  64 year old male- admitted from home.  Patient presented to the ED and attempts were made to place him in a SNF ([facility]) from the ED but he became sick and had to be admitted to the hospital. Patient's wife is his primary caregiver and is unsure now if she wants to seek SNF placement or not. Patient reluctantly agreed to SNF search as did his wife.  Employment status:  Retired, Disabled (Comment on whether or not currently receiving Disability) Insurance information:  Medicare PT Recommendations:  Skilled Nursing Facility Information / Referral to community resources:  Skilled Nursing Facility  Patient/Family's Response to care:   Patient is alert and oriented to person and  place- he is forgetful and has some difficulty with expressing his needs/thoughts.  He wants to return home and feels that his wife can help him. Wife is extremely conflicted about placement- stating that she has been providing for his care at home and that he is upset with her over thoughts of seeking SNF placement.  Wife plans to tour several SNF's that offered and will discuss with CSW in the morning.    Patient/Family's Understanding of and Emotional Response to Diagnosis, Current Treatment, and Prognosis:  Wife required a great deal of support this afternoon during call to discuss d/c plan. She verbalize feelings of guilt and frustration as she does not know what she should do about seeking SNF placement as patient is upset with her.  She knows he wants to come home and receive home home.  DC options discussed.   Emotional Assessment Appearance:  Appears stated age Attitude/Demeanor/Rapport:  Lethargic, Apprehensive (Delayed responses ) Affect (typically observed):  Guarded, Sad, Pleasant (Wants to go home; has some deficits in cognition) Orientation:  Fluctuating Orientation (Suspected and/or reported Sundowners) Alcohol / Substance use:  Tobacco Use Psych involvement (Current and /or in the community):  No (Comment)  Discharge Needs  Concerns to be addressed:  Care Coordination Readmission within the last 30 days:  Yes Current discharge risk:  None Barriers to Discharge:  No Barriers Identified   Lovette Cliche  T, LCSW 03/09/2015, 4:24 PM

## 2015-03-10 DIAGNOSIS — R531 Weakness: Secondary | ICD-10-CM | POA: Insufficient documentation

## 2015-03-10 LAB — CBC
HCT: 25.7 % — ABNORMAL LOW (ref 39.0–52.0)
Hemoglobin: 8.8 g/dL — ABNORMAL LOW (ref 13.0–17.0)
MCH: 34 pg (ref 26.0–34.0)
MCHC: 34.2 g/dL (ref 30.0–36.0)
MCV: 99.2 fL (ref 78.0–100.0)
PLATELETS: 211 10*3/uL (ref 150–400)
RBC: 2.59 MIL/uL — ABNORMAL LOW (ref 4.22–5.81)
RDW: 13.5 % (ref 11.5–15.5)
WBC: 11.3 10*3/uL — AB (ref 4.0–10.5)

## 2015-03-10 LAB — BASIC METABOLIC PANEL
Anion gap: 8 (ref 5–15)
BUN: 10 mg/dL (ref 6–20)
CALCIUM: 8.2 mg/dL — AB (ref 8.9–10.3)
CO2: 30 mmol/L (ref 22–32)
Chloride: 95 mmol/L — ABNORMAL LOW (ref 101–111)
Creatinine, Ser: 1.29 mg/dL — ABNORMAL HIGH (ref 0.61–1.24)
GFR calc non Af Amer: 57 mL/min — ABNORMAL LOW (ref >60)
Glucose, Bld: 77 mg/dL (ref 65–99)
Potassium: 3.8 mmol/L (ref 3.5–5.1)
Sodium: 133 mmol/L — ABNORMAL LOW (ref 135–145)

## 2015-03-10 MED ORDER — ZINC OXIDE 40 % EX OINT: TOPICAL_OINTMENT | Freq: Every morning | CUTANEOUS | Status: AC

## 2015-03-10 MED ORDER — LEVOFLOXACIN 750 MG PO TABS: 750.0000 mg | ORAL_TABLET | Freq: Every day | ORAL | Status: DC

## 2015-03-10 NOTE — Discharge Instructions (Signed)
°Emergency Department Resource Guide °1) Find a Doctor and Pay Out of Pocket °Although you won't have to find out who is covered by your insurance plan, it is a good idea to ask around and get recommendations. You will then need to call the office and see if the doctor you have chosen will accept you as a new patient and what types of options they offer for patients who are self-pay. Some doctors offer discounts or will set up payment plans for their patients who do not have insurance, but you will need to ask so you aren't surprised when you get to your appointment. ° °2) Contact Your Local Health Department °Not all health departments have doctors that can see patients for sick visits, but many do, so it is worth a call to see if yours does. If you don't know where your local health department is, you can check in your phone book. The CDC also has a tool to help you locate your state's health department, and many state websites also have listings of all of their local health departments. ° °3) Find a Walk-in Clinic °If your illness is not likely to be very severe or complicated, you may want to try a walk in clinic. These are popping up all over the country in pharmacies, drugstores, and shopping centers. They're usually staffed by nurse practitioners or physician assistants that have been trained to treat common illnesses and complaints. They're usually fairly quick and inexpensive. However, if you have serious medical issues or chronic medical problems, these are probably not your best option. ° °No Primary Care Doctor: °- Call Health Connect at  832-8000 - they can help you locate a primary care doctor that  accepts your insurance, provides certain services, etc. °- Physician Referral Service- 1-800-533-3463 ° °Chronic Pain Problems: °Organization         Address  Phone   Notes  °[facility]  (336) 297-2271 Patients need to be referred by their primary care doctor.  ° °Medication  Assistance: °Organization         Address  Phone   Notes  °[place] [facility] [address] °[place], [place] [address] (336) 641-8030 --Must be a resident of [place] °-- Must have NO insurance coverage whatsoever (no Medicaid/ Medicare, etc.) °-- The pt. MUST have a primary care doctor that directs their care regularly and follows them in the community °  °[facility]  (866) 331-1348   °[employer] Way  (888) 892-1162   ° °Agencies that provide inexpensive medical care: °Organization         Address  Phone   Notes  °[facility]  (336) 832-8035   °[facility]    (336) 832-7272   °[facility] [address] °[place], [place] [address] (336) 832-4777   °[facility] [address], °[place] (336) 271-4999   °[facility]    (336) 373-0678   °[facility]    (336) 272-1050   °[facility] ° [address], [place] Phone:  (336) 832-4444, Fax:  (336) 832-4440 Hours of Operation:  9 am - 6 pm, M-F.  Also accepts Medicaid/Medicare and self-pay.  °[facility] ° [address], [place] Phone: (336) 832-3150, Fax: (336) 832-3151. Hours of Operation:  8:30 am - 5:30 pm, M-F.  Also accepts Medicaid and self-pay.  °[facility] [place] [address], [place] Phone: (336) 878-6027   °[facility] [address], [place], [place] (336)723-1848, Ext. 123 Mondays & Thursdays: 7-9 AM.  First 15 patients are seen on a first come, first serve basis. °  ° °Medicaid-accepting [place] Providers: ° °Organization         Address  Phone   Notes  °[facility] [address], [place] (336) 641-2100 Also accepts self-pay patients.  °[facility] [address], [place] ° (336) 856-9996   °[facility] [address], [place]  (336) 288-8857   °[facility] [address] [place] [address], [place] (336) 299-7000   °Veita Bland [address], [place]  ° (336) 373-1557 Only accepts [place] Access Medicaid patients after they have their name applied to their card.  ° °Self-Pay (no insurance) in [place]: ° °Organization         Address  Phone   Notes  °Sickle Cell Patients, [facility] [address], [place] (336) 832-1970   °[facility] [address], [place] (336) 832-4400   °[facility] ° [address], [place] (336) 992-4800   °[facility]/Dr. Osei-Bonsu ° [address] [place] [address], [place] or [address], [place] (336) 841-8500 Phone number for both [place] and [place] locations is the same.  °[facility] [address], [place] (336) 299-0000   °[facility] [address] [place] [address], [place] or [address] (336) 852-7530 °(336) 878-2260   °[facility] [address], [place] (336) 350-1642, phone; (336) 294-5005, fax Sees patients 1st and 3rd Saturday of every month.  Must not qualify for public or private insurance (i.e. Medicaid, Medicare, [place] Health Choice, Veterans' Benefits) • Household income should be no more than 200% of the poverty level •The clinic cannot treat you if you are pregnant or think you are pregnant • Sexually transmitted diseases are not treated at the clinic.  ° ° °Dental Care: °Organization         Address  Phone  Notes  °[place] [facility] [address], [place] (336) 641-6152 Accepts children up to age 21 who are enrolled in Medicaid or [place] Health Choice; pregnant women with a Medicaid card; and children who have applied for Medicaid or [place] Health Choice, but were declined, whose parents can pay a reduced fee at time of service.  °[place]  [facility] [place]  [address], [place] (336) 641-7733 Accepts children up to age 21 who are enrolled in Medicaid or [place] Health Choice; pregnant women with a Medicaid card; and children who have applied for Medicaid or [place] Health Choice, but were declined, whose parents can pay a reduced fee at time of service.  °[facility] ° [address], [place] (336) 641-4533 Patients are seen by appointment only. Walk-ins are not accepted. [facility] will see patients 18 years of age and older. °Monday - Tuesday (8am-5pm) °Most Wednesdays (8:30-5pm) °$30 per visit, cash only  °[facility] ° [address], [place] (336) 641-4533 Patients are seen by appointment only. Walk-ins are not accepted. [facility] will see patients 18 years of age and older. °One   Wednesday Evening (Monthly: Volunteer Based).  $30 per visit, cash only  °[facility]  (919) 537-3737 for adults; Children under age 4, call Graduate Pediatric Dentistry at (919) [phone]. Children aged 4-14, please call (919) [phone] to request a pediatric application. ° Dental services are provided in all areas of dental care including fillings, crowns and bridges, complete and partial dentures, implants, gum treatment, root canals, and extractions. Preventive care is also provided. Treatment is provided to both adults and children. °Patients are selected via a lottery and there is often a waiting list. °  °[facility] [address], °[place] ° (336) 763-8833 [email or web address] °  °[facility] [address], [place], [place] (336)723-1848, Ext. 123 Second and Fourth Thursday of each month, opens at 6:30 AM; Clinic ends at 9 AM.  Patients are seen on a first-come first-served basis, and a limited number are seen during each clinic.  ° °[facility] ° [address], [place], [place] (336) 723-7904    Eligibility Requirements °You must have lived in [place], [place], or [place] counties for at least the last three months. °  You cannot be eligible for state or federal sponsored healthcare insurance, including Veterans Administration, Medicaid, or Medicare. °  You generally cannot be eligible for healthcare insurance through your employer.  °  How to apply: °Eligibility screenings are held every Tuesday and Wednesday afternoon from 1:00 pm until 4:00 pm. You do not need an appointment for the interview!  °[facility] [address], [place], [place] 336-631-2330   °[facility] [facility]  336-342-8273   °[place]  336-703-3100   °[facility]  336-570-6415   ° °Behavioral Health Resources in the Community: °Intensive Outpatient Programs °Organization         Address  Phone  Notes  °[facility] [address], [place], [place] 336-878-6098   °[facility] [address], [place], [place] 336-832-9800   °[facility] [address], [place], [place] ° 336-882-2125   °[facility] [address],  °[place], [place] 1-800-853-5163 or 336-641-4981   °Substance Abuse Resources °Organization         Address  Phone  Notes  °[facility]  336-882-2125   °[facility]  336-784-9470   °[facility]  336-285-9073   °[facility]  336-845-3988   °Residential & Outpatient Substance Abuse Program  1-800-659-3381   °Psychological Services °Organization         Address  Phone  Notes  °[facility]  336- 832-9600   °[facility]  336- 378-7881   °[facility] [address], [place] 1-800-853-5163 or 336-641-4981   ° °Mobile Crisis Teams °Organization         Address  Phone  Notes  °[facility], Mobile Crisis Care Unit  1-877-626-1772   °Assertive °[facility] ° [address]  [place], [place] 336-834-9664   °Sharon DeEsch [address] °[place] 336-554-5454   ° °Self-Help/Support Groups °Organization         Address  Phone             Notes  °[facility] of  - variety of support groups  336- 373-1402 Call for more information  °Narcotics Anonymous (NA), [facility] [address], °[place]  2 meetings at this location  ° °  Residential Treatment Programs Organization         Address  Phone  Notes  [facility] [address],    [place]  [phone])054-1223   [facility]  [address], [place] [phone], [place], [place] [phone]   [facility] [address], [place] [phone] Admissions: 8am-3pm M-F  [facility] [address]    [place], [place] [phone]   [facility] [address], [place], [place] [phone]   [facility] [address]  [place], [place] [phone]   [facility] Intensive Outpatient [address], [place], [place] [phone]   [facility] [address]  [place], [place] [phone])[phone] or [phone]   [facility] [address] [place], [place] [phone] Accepts Medicaid  [facility] [address]  [place] [phone] Substance Abuse/Addiction Treatment   [facility] Resources Organization         Address  Phone  Notes  [facility]  781-649-5123(888) ([phone])[phone]   Angie FavaJulie Brannon, PhD [address], [address] [place], [place]   [phone](336) [phone] or [phone](336) [phone]   [facility]   [address] [place], [place] (725)[phone](336) [phone]   [facility] [address], [place], [place] (367)[phone](336) [phone] Insurance/Medicaid/sponsorship through [facility] [address]                                    [place], [place] [phone](336) [phone] Therapy/tele-psych/case    [facility] [address]   [place], [place] [phone]) [phone]    Dr. Lolly MustacheArfeen  [phone]) [phone]   [facility]  [employer] [facility] 1) [address], [place] 2) [address], [place] 3)  [address], [place] ([phone](336) [phone]) [phone](336) [phone]  [phone](336) [phone]   [facility] Child Abuse Hotline (218) 525-5056(336) (225)019-0521 or 334-273-4494(336) [phone] (After Hours)      Take your usual prescriptions as previously directed.  Call your regular medical doctor tomorrow to schedule a follow up appointment within the next 2 days.  Return to the Emergency Department immediately sooner if worsening.   It is important to go to your hospital follow up appointment on 5/26 at 8:45 am.   Continue to take your antibiotic until it is gone.   It is also important that you apply the barrier cream to his scrotum and inguinal area to avoid further skin irritation / breakdown from incontinence.   It is imperative that you continue to eat well at home including eating Ensure to supplement your nutrition.   Thanks for letting us take care of you.   Sincerely,  Devota Pacealeb Berania Peedin, MD Family Medicine - PGY 1

## 2015-03-10 NOTE — Progress Notes (Signed)
Patient alert oriented, no shortness of breath, ambulate with PT this am, iv and tele d/c, d/c instruction explain and given to patient wife, family verbalized understanding. Patient d/c per order

## 2015-03-10 NOTE — Progress Notes (Signed)
Physical Therapy Treatment Patient Details Name: [name] MRN: [id number] DOB: [birth year] Today's Date: 03/10/2015    History of Present Illness Pt brought to ED by wife for AMS x 14 days. 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    PT Comments    Improved activity tolerance and mobility today. Pt walked 50' with RW and min assist. Pt c/o pain in R neck which decreased with cervical ROM exercises. Instructed pt to perform neck exercises independently. Pt's wife present for tx and now feels she can manage him at home and feels this would be a better environment emotionally for pt. PT now recommending HHPT, 3 in 1, and WC for home.   Follow Up Recommendations  Supervision/Assistance - 24 hour;Home health PT (wife was present for PT session and now feels she can likely manage pt at home)     Equipment Recommendations  3in1 (PT);Wheelchair (measurements PT)    Recommendations for Other Services       Precautions / Restrictions Precautions Precautions: Fall Restrictions Weight Bearing Restrictions: No    Mobility  Bed Mobility Overal bed mobility: Needs Assistance Bed Mobility: Rolling;Sidelying to Sit Rolling: Mod assist Sidelying to sit: Mod assist       General bed mobility comments: verbal and manual cues for technique to initiate roll to L, Mod A to raise trunk (pt 60-70%)  Transfers Overall transfer level: Needs assistance Equipment used: Rolling walker (2 wheeled) Transfers: Sit to/from Stand Sit to Stand: Min assist;From elevated surface         General transfer comment: min A to rise from elevated bed, mod A to rise from low commode, cues for hand placement  Ambulation/Gait Ambulation/Gait assistance: Min guard;Min assist Ambulation Distance (Feet): 50 Feet Assistive device: Rolling walker (2 wheeled) Gait Pattern/deviations: Step-through pattern;Trunk flexed Gait velocity: decreased Gait  velocity interpretation: Below normal speed for age/gender General Gait Details: min/guard for walking straight, min A to negotiate turns/obstacles and side stepping, cues to back all the way up to recliner prior to sitting, forward head posture (wife states this is baseline but is more pronounced today)   Stairs            Wheelchair Mobility    Modified Rankin (Stroke Patients Only)       Balance     Sitting balance-Leahy Scale: Good  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 Worked on extending trunk and neck for more upright posture, pt able to partially correct posture. Wife stated he is kyphotic at baseline but it is more pronounced today.       Standing balance-Leahy Scale: Fair                      Cognition Arousal/Alertness: Awake/alert Behavior During Therapy: Flat affect Overall Cognitive Status: History of cognitive impairments - at baseline Area of Impairment: Safety/judgement   Current Attention Level: Sustained   Following Commands: Follows one step commands with increased time Safety/Judgement: Decreased awareness of deficits Awareness: Intellectual;Emergent Problem Solving: Slow processing;Difficulty sequencing;Requires verbal cues;Requires tactile cues      Exercises Other Exercises Other Exercises: neck rotation to L and R AROM x 5 each in sitting Other Exercises: cervical extension AROM x 5 in sitting    General Comments        Pertinent Vitals/Pain Faces Pain Scale: Hurts worst Pain Location: R side of neck (pt/wife feel this is from being in bed for a  few days); 10/10 prior to movement, 6/10 after walking and neck AROM Pain Descriptors / Indicators: Sore Pain Intervention(s): Patient requesting pain meds-RN notified;Monitored during session;Limited activity within patient's tolerance    Home Living                      Prior Function            PT Goals (current goals can now be found in the care plan section) Acute  Rehab PT Goals Patient Stated Goal: to go home PT Goal Formulation: With patient/family Time For Goal Achievement: 03/16/15 Potential to Achieve Goals: Fair Progress towards PT goals: Progressing toward goals    Frequency  Min 3X/week    PT Plan Discharge plan needs to be updated    Co-evaluation             End of Session Equipment Utilized During Treatment: Gait belt Activity Tolerance: Patient limited by fatigue Patient left: with call bell/phone within reach;in chair;with nursing/sitter in room     Time: 1201-1248 PT Time Calculation (min) (ACUTE ONLY): 47 min  Charges:  $Gait Training: 8-22 mins $Therapeutic Exercise: 8-22 mins $Therapeutic Activity: 8-22 mins                    G Codes:      Tamala Ser 03/10/2015, 1:12 PM [phone]

## 2015-03-10 NOTE — Care Management Note (Signed)
Case Management Note  Patient Details  Name: Austin LewandowskyRobert C Calderone Rivera MRN: [id number] Date of Birth: 01-Nov-1950  Subjective/Objective:    Pt refusing SNF as recommended by PT/OT.                 Action/Plan:  Pt will need HH follow up.  [facility], per pt /fam choice.  Start of care 24-48h post dc date.  Requests BSC and wheelchair for home; [facility] needs.  Wife prefers DME delivered to room prior to dc, and [facility] aware.     Expected Discharge Date:     03/10/15             Expected Discharge Plan:  Skilled Nursing Facility  In-House Referral:  Clinical Social Work  Discharge planning Services  CM Consult  Post Acute Care Choice:  Home Health Choice offered to:  Spouse, [facility] POA / Guardian  DME Arranged:  Bedside commode, Wheelchair manual DME Agency:  [facility]  HH Arranged:  RN, PT [facility]  [facility]  Status of Service:  Completed, signed off  Medicare Important Message Given:  Yes Date Medicare IM Given:  03/10/15 Medicare IM give by:  [name], RN, BSN  Date Additional Medicare IM Given:    Additional Medicare Important Message give by:     If discussed at Long Length of Stay Meetings, dates discussed:    Additional Comments:  [name], [name], RN 03/10/2015, 2:28 PM Phone #[phone]

## 2015-03-10 NOTE — Care Management Note (Signed)
Case Management Note  Patient Details  Name: [name] MRN: [id number] Date of Birth: Nov 21, 1950  Subjective/Objective:                    Action/Plan:   Expected Discharge Date:                  Expected Discharge Plan:  Skilled Nursing Facility  In-House Referral:  Clinical Social Work  Discharge planning Services  CM Consult  Post Acute Care Choice:  Home Health Choice offered to:  Spouse, [facility] POA / Guardian  DME Arranged:  Bedside commode, Wheelchair manual DME Agency:  [facility]  HH Arranged:  RN, PT [facility] Agency:  [facility]  Status of Service:  Completed, signed off  Medicare Important Message Given:  Yes Date Medicare IM Given:  03/10/15 Medicare IM give by:  Sidney AceJulie Kalvin Buss, RN, BSN  Date Additional Medicare IM Given:    Additional Medicare Important Message give by:     If discussed at Long Length of Stay Meetings, dates discussed:    Additional Comments:  [name], Karem Tomaso M, RN 03/10/2015, 2:31 PM

## 2015-03-10 NOTE — Discharge Summary (Signed)
[facility] Discharge Summary  Patient name: [name] Medical record number: [id number] Date of birth: 09-05-1951 Age: 64 y.o. Gender: male Date of Admission: 03/05/2015  Date of Discharge: 03/10/2015 Admitting Physician: Austin Dawn, MD  Primary Care Provider: Thersa Salt, DO Consultants: None  Indication for Hospitalization: HCAP, UTI  Discharge Diagnoses/Problem List:  Altered Mental Status - resolved  HFrEF CAD s/p CABG x 6 COPD AAA TBI Seizure Disorder PTSD Chronic Pancreatitis HLD  Disposition: Home with Home Health - though recommended SNF.   Discharge Condition: Stable  Discharge Exam:  General: NAD, AAOx2 Cardiovascular: RRR, No MGR, Normal S1/S2, 2+ distal pulses.  Respiratory: Better air movement today, No crackles, wheezes, or rales. Appropriate rate, unlabored.   Abdomen: S, ND, NT, +BS, No organomegaly.  Extremities: WWP, 2+ distal pulses. No peripheral edema. MAEW.  Neuro: AAOx2, Oriented to person, place. No other focal deficits.  Psych: Appropriate affect. Stable.  Skin: Scrotal skin breakdown continues. No signs of infection. Improving.   Brief Hospital Course:  [name] is a 64 y.o. male presenting with HCAP and UTI . PMH is significant for HFrEF, CAD s/p CABG, AAA, COPD, TBI, seizures, PTSD, pancreatitis, HLD.  Patient was admitted to the hospital on 2 previous occasions this month. He had been awaiting SNF placement due to the wife being unable to care for him at home. She had brought him to the ED for 3 days where he had been waiting for SNF placement due to not meeting criteria for admission. On day 3 of ED stay, he developed some SOB and suprapubic tenderness.   # HCAP:In the ED CXR with evidence of pneumonia.He was febrile initially to 101.5 with WBC of 18. He met Sepsis criteria initially, though lactate was normal and his vital signs were stable. His fever resolved with antibiotic therapy and his  WBC trended down to 12. An RVP was negative, Strep pneumo Urine ag negative. He improved clinically, and he was transitioned to PO levaquin for treatment of HCAP and UTI after 2 days of IV antibiotics during which he was afebrile. Blood cultures were no growth for 72 hours at discharge.   # Urinary Tract Infection: in the ED patient was noted to have U/A with evidence of infection. WBC and fevers as above. He did have suprapubic tenderness as well. He had had Urinary retention requiring foley catheterization at his last admission placing him at risk for UTI. A urine culture returned positive for Klebsiella Pneumoniae that was sensitive to Levaquin. He was initially on IV antibiotics as above but transitioned to oral Levaquin for continuation of antibiotics as an outpatient for a total 14 day course.   # Scrotal Edema / Skin Breakdown: Pt. With redness and some small amount of skin breakdown on his scrotum and inner left thigh. This was noted on admission. This is due to incontinence / poor control of the urinal when urinating in the ED. Barrier cream was applied and it was improving at discharge. Home care instructions given and barrier cream prescribed.   Other Chronic Medical problems Managed This Admission:  HFrEF - Last EF 40-45%. We did not give fluid due to his sensitive volume status. He was continued on lasix 40 mg every other day, and we did not continue his lisinopril or coreg here due to soft blood pressures. His weight trended down to the low 150's and was overall 10lbs down from admission.  COPD  - Not requiring controller meds.  CAD s/p CABG / AAA - No BB or ACI here. Blood pressure and HR appropriate. Continued on ASA, Pravastatin.  TBI / PTSD / Seizure Disorder - Continued on Depakote.  HLD - Continued on pravastatin due to excellent lipid profile at last check.   Severe Protein Calorie Malnutrition / Deconditioning - This is a main component of this patient's current problem. He is very  weak, and he is very picky with eating and refuses nutritional supplementation with Ensure. His Albumin is 1.5. His total protein is around 4, and he has a hard time / poor effort even with inspiration. He has temporal, clavicular, and LE muscle wasting. He would benefit from nutritional supplementation as an outpatient and ongoing physical therapy / RN care at home. Nutritional repletion will be a mainstay of his recovery otherwise his trajectory will continue to be poor.   Issues for Follow Up:  1. Follow up improvement in respiratory status / Urinary symptoms and compliance with antibiotics.  2. Follow up fluid status for HF.  3. Follow up compliance with diet / Ensure and nutritional status.  4. Follow up need for SNF placement.   Significant Procedures: None  Significant Labs and Imaging:   Recent Labs Lab 03/08/15 0248 03/09/15 0239 03/10/15 0355  WBC 17.2* 12.1* 11.3*  HGB 9.0* 8.9* 8.8*  HCT 26.8* 26.2* 25.7*  PLT 180 199 211    Recent Labs Lab 03/05/15 1121 03/07/15 1542 03/07/15 2045 03/08/15 0248 03/09/15 0239 03/10/15 0355  NA 132* 132*  --  131* 134* 133*  K 3.2* 3.5  --  3.2* 3.2* 3.8  CL 96* 94*  --  95* 93* 95*  CO2 26 29  --  27 30 30   GLUCOSE 89 86  --  76 95 77  BUN 14 13  --  12 8 10   CREATININE 1.20 1.15  --  1.10 1.22 1.29*  CALCIUM 8.2* 8.0*  --  7.8* 7.9* 8.2*  MG  --   --  1.7  --   --   --   PHOS  --   --  2.4*  --   --   --   ALKPHOS 55  --   --   --   --   --   AST 20  --   --   --   --   --   ALT 8*  --   --   --   --   --   ALBUMIN 1.5*  --   --   --   --   --    Lactic Acid - 1.04   Imaging/Diagnostic Tests: CXR 5/15:   IMPRESSION: Worsening left base opacification likely small to moderate effusion with associated atelectasis as cannot exclude infection in the left base.   Results/Tests Pending at Time of Discharge: None  Discharge Medications:    Medication List    STOP taking these medications         oxyCODONE-acetaminophen 5-325 MG per tablet  Commonly known as:  PERCOCET/ROXICET      TAKE these medications        acetaminophen 650 MG CR tablet  Commonly known as:  TYLENOL  Take 650 mg by mouth every 8 (eight) hours as needed for pain.     aspirin 325 MG tablet  Take 325 mg by mouth daily.     divalproex 500 MG DR tablet  Commonly known as:  DEPAKOTE  Take 500 mg by mouth 3 (three) times  daily. Prescribed by  his neurologist in [facility]     docusate sodium 100 MG capsule  Commonly known as:  COLACE  Take 1 capsule (100 mg total) by mouth 2 (two) times daily as needed for mild constipation.     feeding supplement (ENSURE ENLIVE) Liqd  Take 237 mLs by mouth 3 (three) times daily between meals.     furosemide 40 MG tablet  Commonly known as:  LASIX  TAKE ONE TABLET BY MOUTH EVERY OTHER DAY     levofloxacin 750 MG tablet  Commonly known as:  LEVAQUIN  Take 1 tablet (750 mg total) by mouth daily at 6 PM.     lisinopril 2.5 MG tablet  Commonly known as:  PRINIVIL,ZESTRIL  Take 1 tablet (2.5 mg total) by mouth daily.     liver oil-zinc oxide 40 % ointment  Commonly known as:  DESITIN  Apply topically every morning.     ondansetron 4 MG tablet  Commonly known as:  ZOFRAN  Take 1 tablet (4 mg total) by mouth every 8 (eight) hours as needed for nausea or vomiting.     pravastatin 40 MG tablet  Commonly known as:  PRAVACHOL  Take 1 tablet (40 mg total) by mouth daily.     ranitidine 150 MG capsule  Commonly known as:  ZANTAC  Take 300 mg by mouth daily.     tamsulosin 0.4 MG Caps capsule  Commonly known as:  FLOMAX  Take 1 capsule (0.4 mg total) by mouth daily.        Discharge Instructions: Please refer to Patient Instructions section of EMR for full details.  Patient was counseled important signs and symptoms that should prompt return to medical care, changes in medications, dietary instructions, activity restrictions, and follow up appointments.    Follow-Up Appointments: Follow-up Information    Follow up with Austin Salt, DO. Go on 03/18/2015.   Specialty:  Family Medicine   Why:  Hospital Follow Up 8:45 am.    Contact information:   [place] [address] [phone]       Aquilla Hacker, MD 03/10/2015, 2:09 PM PGY-1, [place]

## 2015-03-12 ENCOUNTER — Telehealth: Payer: Self-pay | Admitting: *Deleted

## 2015-03-12 NOTE — Telephone Encounter (Signed)
Threasa AlphaJim Hoffman, [facility] left voice message stating he did a PT eval for most recent hospitalization.  He wanted to report that pt has not had a bowel movement since prior to be in the hospital.  Pt's has low BP reading 90/60, and 80/50 standing.  Pt denies dizziness, but fatigue.  Per Rosanne AshingJim patient was given instructions by home health nurse to start taking the medication prescribed for constipation.  Will forward to PCP.  Clovis PuMartin, Tamika L, RN

## 2015-03-13 ENCOUNTER — Other Ambulatory Visit: Payer: Self-pay | Admitting: Family Medicine

## 2015-03-13 LAB — CULTURE, BLOOD (ROUTINE X 2)
CULTURE: NO GROWTH
Culture: NO GROWTH

## 2015-03-13 MED ORDER — POLYETHYLENE GLYCOL 3350 17 GM/SCOOP PO POWD
17.0000 g | Freq: Two times a day (BID) | ORAL | Status: DC | PRN
Start: 2015-03-13 — End: 2015-03-19

## 2015-03-13 NOTE — Telephone Encounter (Signed)
Rx for miralax sent 

## 2015-03-15 ENCOUNTER — Telehealth: Payer: Self-pay | Admitting: Cardiology

## 2015-03-15 NOTE — Telephone Encounter (Signed)
[facility] called to report patient BPs very low. She has rechecked a few times, highest obtained was 58/32. Patient is exhibiting weakness, lightheaded, dizzy. Recent hospitalizations w/ similar problem. He was rec'd at last discharge to go to SNF, elected for home health instead.  Concern for acute injury/impaired perfusion.  Advised EMS evaluation on-scene -> ED eval - home health nurse agreed w/ plan.

## 2015-03-15 NOTE — Telephone Encounter (Signed)
Call handled by JC, Dr. Antoine PocheHochrein - noted agreement w/ this nurse's plan - see other telephone note for this date.

## 2015-03-15 NOTE — Telephone Encounter (Signed)
Ms.holden is calling because Mr. Rosalia HammersRay bp is really low ..Marland Kitchen

## 2015-03-15 NOTE — Telephone Encounter (Signed)
I spoke with EMS.  This patient has been followed by primary care and was recently in the hospital not on our service.  They are to contact the primary team/MD.

## 2015-03-17 ENCOUNTER — Telehealth: Payer: Self-pay | Admitting: Cardiology

## 2015-03-17 NOTE — Telephone Encounter (Signed)
He is to come in to see a flexed mid-level provider for further evaluation

## 2015-03-17 NOTE — Telephone Encounter (Signed)
HOME HEALTH NURSE CALLED FROM PATIENT 'S HOME. Algis GreenhouseMARY Holden RN reports  That patient's blood pressure is 58/38 ,pulse 100 today. She saw patient 2 days ago with t he same reading at that time  EMS was called and patient decline to go to hospital. Informaton was reported at that time.  Patient not symptomatic, [facility]. Patient decline to go to ER today as well Patient only takes lasix with other medication in morning, lisinopril at bedtime. Patient no longer takes flomax per family member. Had allergic reaction- (swelling tongue and lips) RN informed Corrie DandyMary will send information to D.O.D -Dr Allyson SabalBerry and Dr Antoine PocheHochrein Patient has an appointment on 04/29/15 at 4 pm with Dr Antoine PocheHOCHREIN If patient becomes worse have family to call EMS.Family is aware.

## 2015-03-17 NOTE — Telephone Encounter (Signed)
FORWARD TO SCHEDULERS PLEASE CONTACT PATIENT WITH APPOINTMENT

## 2015-03-18 ENCOUNTER — Inpatient Hospital Stay: Payer: Medicare Other | Admitting: Family Medicine

## 2015-03-19 ENCOUNTER — Encounter: Payer: Self-pay | Admitting: Cardiology

## 2015-03-19 ENCOUNTER — Ambulatory Visit (INDEPENDENT_AMBULATORY_CARE_PROVIDER_SITE_OTHER): Payer: Medicare Other | Admitting: Cardiology

## 2015-03-19 VITALS — BP 79/53 | HR 80 | Ht 72.0 in | Wt 154.0 lb

## 2015-03-19 DIAGNOSIS — I9589 Other hypotension: Secondary | ICD-10-CM | POA: Diagnosis not present

## 2015-03-19 DIAGNOSIS — I2510 Atherosclerotic heart disease of native coronary artery without angina pectoris: Secondary | ICD-10-CM

## 2015-03-19 NOTE — Progress Notes (Signed)
HPI The patient presents for followup of coronary disease and ischemic cardiomyopathy.  He was referred to EP to consider an ICD in the past.  However, he cancelled this appt and does not want to consider this.  It's been a while since I saw him. He was in the hospital recently with pneumonia and urinary infection.  I reviewed these records. We actually got a phone call shortly after discharge from a home health nurse because his blood pressure was low. The patient refused transport to the hospital even though his blood pressure was said to be in the 50s. He was not syncopal and that time. He actually says he does relatively well. He gets around little bit though he's frail and cachectic area he has had some increased lower extremity swelling but is not describing new shortness of breath, PND or orthopnea. She's had no palpitations, presyncope or syncope. He's not eating very much his family is encouraging that her food. He's clearly malnourished.  Allergies  Allergen Reactions  . Aspirin     To much aspirin make his bleed on the inside can take a small amount.  . Flomax [Tamsulosin Hcl] Swelling  . Morphine And Related Other (See Comments)    "MAKES HIM MEAN"    Current Outpatient Prescriptions  Medication Sig Dispense Refill  . aspirin 325 MG tablet Take 325 mg by mouth daily.      . divalproex (DEPAKOTE) 500 MG EC tablet Take 500 mg by mouth 3 (three) times daily. Prescribed by  his neurologist in [facility]    . furosemide (LASIX) 40 MG tablet TAKE ONE TABLET BY MOUTH EVERY OTHER DAY 30 tablet 5  . lisinopril (PRINIVIL,ZESTRIL) 2.5 MG tablet Take 1 tablet (2.5 mg total) by mouth daily. 30 tablet 0  . liver oil-zinc oxide (DESITIN) 40 % ointment Apply topically every morning. 56.7 g 0  . pravastatin (PRAVACHOL) 40 MG tablet Take 1 tablet (40 mg total) by mouth daily. 90 tablet 3  . ranitidine (ZANTAC) 150 MG capsule Take 300 mg by mouth daily.      No current facility-administered  medications for this visit.    Past Medical History  Diagnosis Date  . Seizure disorder   . Nausea   . History of heartburn   . Chronic back pain   . IHD (ischemic heart disease)   . Cigarette smoker   . Hypercholesterolemia   . CHF (congestive heart failure)     EF had been 20% initially. 2010 EF 35-40%.  . Traumatic brain injury   . Seizures   . COPD (chronic obstructive pulmonary disease)   . Pancreatitis     Past Surgical History  Procedure Laterality Date  . Cardiac catheterization  03/12/2007  . Coronary artery bypass graft  2000    x6, graft to the PDA and LAD, patent saphenous vein graft to acute marginal and posterior lateral severely diseased saphenous vein graft to diagonal. Patent LIMA to the LAD.  Marland Kitchen Shoulder surgery      right  . Facial reconstruction surgery    . Back surgery      ROS:  As stated in the HPI and negative for all other systems.  PHYSICAL EXAM BP 79/53 mmHg  Pulse 80  Ht 6' (1.829 m)  Wt 154 lb (69.854 kg)  BMI 20.88 kg/m2 GENERAL:  Frail appearing HEENT:  Pupils equal round and reactive, fundi not visualized, oral mucosa unremarkable, bruising on the right side of his face NECK:  No  jugular venous distention, waveform within normal limits, carotid upstroke brisk and symmetric, no bruits, no thyromegaly LUNGS:  Clear to auscultation bilaterally BACK:  No CVA tenderness, lordosis CHEST:  Well healed sternotomy scar, decreased breath sounds.   HEART:  PMI not displaced or sustained,S1 and S2 within normal limits, no S3, no S4, no clicks, no rubs, no murmurs ABD:  Flat, positive bowel sounds normal in frequency in pitch, no bruits, no rebound, no guarding, no midline pulsatile mass, no hepatomegaly, no splenomegaly EXT:  2 plus pulses throughout, moderate ankle edema, no cyanosis no clubbing  ASSESSMENT AND PLAN  CHRONIC SYSTOLIC HEART FAILURE -  His EF was actually slightly improved on his recent hospitalization. I don't think he has  left-sided failure. At this point because of his low blood pressures I'm going to stop his lisinopril altogether.  EDEMA:  I think this is secondary to malnutrition and may be venous stasis. I prescribed compression stockings.  CORONARY, ARTERIOSCLEROSIS -  The patient has no new sypmtoms. No further cardiovascular testing is indicated. We will continue with aggressive risk reduction and meds as listed.   TOBACCO DEPENDENCE -  He has no plan or desire to quit smoking.  CONVULSIONS, SEIZURES, -  He reports he has had seizure in 5 months  AAA - This was 5.1 x 4.7 cm in February. He has follow up with Dr. Edilia Boickson in Sept.

## 2015-03-19 NOTE — Patient Instructions (Signed)
Your physician recommends that you schedule a follow-up appointment in: 4 months with Dr. Antoine PocheHochrein  Stop taking your Lisinopril  Start wearing your compression stockings  [facility] and [facility]

## 2015-03-23 ENCOUNTER — Telehealth: Payer: Self-pay | Admitting: Cardiology

## 2015-03-23 NOTE — Telephone Encounter (Signed)
Pt's wife called in stating that when they left the office on 5/27, the pt's carvedilol was not on his med's list and she wanted to make sure that was put back on there. She wanted his nurse to call her back to confirm this has been done.  Thanks

## 2015-03-24 ENCOUNTER — Telehealth: Payer: Self-pay | Admitting: Cardiology

## 2015-03-24 NOTE — Telephone Encounter (Signed)
Rosanne AshingJim ( [facility]) is calling because , working with patient in the home for PT , Patient reports episodes of choking , requesting verbal authorization for Speech Therapy . Please call   Thanks

## 2015-03-24 NOTE — Telephone Encounter (Signed)
Left secure VM for caller - advised to contact pt's PCP regarding this issue, call us if needed.

## 2015-03-26 ENCOUNTER — Other Ambulatory Visit: Payer: Self-pay

## 2015-03-26 ENCOUNTER — Other Ambulatory Visit (HOSPITAL_COMMUNITY): Payer: Self-pay

## 2015-03-26 ENCOUNTER — Encounter (HOSPITAL_COMMUNITY): Payer: Self-pay | Admitting: Emergency Medicine

## 2015-03-26 ENCOUNTER — Inpatient Hospital Stay (HOSPITAL_COMMUNITY)
Admission: EM | Admit: 2015-03-26 | Discharge: 2015-04-09 | DRG: 280 | Disposition: A | Discharge status: Other Acute Inpatient Hospital | Payer: Medicare Other | Attending: Cardiology | Admitting: Cardiology

## 2015-03-26 ENCOUNTER — Encounter: Payer: Self-pay | Admitting: Cardiology

## 2015-03-26 ENCOUNTER — Emergency Department (HOSPITAL_COMMUNITY): Payer: Medicare Other

## 2015-03-26 DIAGNOSIS — J90 Pleural effusion, not elsewhere classified: Secondary | ICD-10-CM

## 2015-03-26 DIAGNOSIS — E46 Unspecified protein-calorie malnutrition: Secondary | ICD-10-CM | POA: Diagnosis present

## 2015-03-26 DIAGNOSIS — K861 Other chronic pancreatitis: Secondary | ICD-10-CM | POA: Diagnosis present

## 2015-03-26 DIAGNOSIS — R6521 Severe sepsis with septic shock: Secondary | ICD-10-CM | POA: Diagnosis not present

## 2015-03-26 DIAGNOSIS — Z7982 Long term (current) use of aspirin: Secondary | ICD-10-CM | POA: Diagnosis not present

## 2015-03-26 DIAGNOSIS — E876 Hypokalemia: Secondary | ICD-10-CM | POA: Diagnosis not present

## 2015-03-26 DIAGNOSIS — I959 Hypotension, unspecified: Secondary | ICD-10-CM

## 2015-03-26 DIAGNOSIS — I2510 Atherosclerotic heart disease of native coronary artery without angina pectoris: Secondary | ICD-10-CM | POA: Diagnosis present

## 2015-03-26 DIAGNOSIS — I252 Old myocardial infarction: Secondary | ICD-10-CM | POA: Diagnosis not present

## 2015-03-26 DIAGNOSIS — I714 Abdominal aortic aneurysm, without rupture: Secondary | ICD-10-CM | POA: Diagnosis present

## 2015-03-26 DIAGNOSIS — J9690 Respiratory failure, unspecified, unspecified whether with hypoxia or hypercapnia: Secondary | ICD-10-CM

## 2015-03-26 DIAGNOSIS — A419 Sepsis, unspecified organism: Secondary | ICD-10-CM | POA: Diagnosis not present

## 2015-03-26 DIAGNOSIS — I255 Ischemic cardiomyopathy: Secondary | ICD-10-CM | POA: Diagnosis not present

## 2015-03-26 DIAGNOSIS — I214 Non-ST elevation (NSTEMI) myocardial infarction: Secondary | ICD-10-CM | POA: Diagnosis present

## 2015-03-26 DIAGNOSIS — Z951 Presence of aortocoronary bypass graft: Secondary | ICD-10-CM | POA: Diagnosis not present

## 2015-03-26 DIAGNOSIS — R579 Shock, unspecified: Secondary | ICD-10-CM | POA: Diagnosis not present

## 2015-03-26 DIAGNOSIS — R112 Nausea with vomiting, unspecified: Secondary | ICD-10-CM

## 2015-03-26 DIAGNOSIS — E872 Acidosis: Secondary | ICD-10-CM | POA: Diagnosis present

## 2015-03-26 DIAGNOSIS — J810 Acute pulmonary edema: Secondary | ICD-10-CM | POA: Diagnosis not present

## 2015-03-26 DIAGNOSIS — R079 Chest pain, unspecified: Secondary | ICD-10-CM | POA: Diagnosis present

## 2015-03-26 DIAGNOSIS — I5043 Acute on chronic combined systolic (congestive) and diastolic (congestive) heart failure: Secondary | ICD-10-CM | POA: Diagnosis not present

## 2015-03-26 DIAGNOSIS — R1084 Generalized abdominal pain: Secondary | ICD-10-CM

## 2015-03-26 DIAGNOSIS — I509 Heart failure, unspecified: Secondary | ICD-10-CM

## 2015-03-26 DIAGNOSIS — G40909 Epilepsy, unspecified, not intractable, without status epilepticus: Secondary | ICD-10-CM | POA: Diagnosis present

## 2015-03-26 DIAGNOSIS — R7989 Other specified abnormal findings of blood chemistry: Secondary | ICD-10-CM | POA: Diagnosis not present

## 2015-03-26 DIAGNOSIS — I9589 Other hypotension: Secondary | ICD-10-CM

## 2015-03-26 DIAGNOSIS — F17210 Nicotine dependence, cigarettes, uncomplicated: Secondary | ICD-10-CM | POA: Diagnosis present

## 2015-03-26 DIAGNOSIS — K859 Acute pancreatitis without necrosis or infection, unspecified: Secondary | ICD-10-CM

## 2015-03-26 DIAGNOSIS — R0603 Acute respiratory distress: Secondary | ICD-10-CM

## 2015-03-26 DIAGNOSIS — I5022 Chronic systolic (congestive) heart failure: Secondary | ICD-10-CM | POA: Diagnosis not present

## 2015-03-26 DIAGNOSIS — F4310 Post-traumatic stress disorder, unspecified: Secondary | ICD-10-CM | POA: Diagnosis present

## 2015-03-26 DIAGNOSIS — G9340 Encephalopathy, unspecified: Secondary | ICD-10-CM | POA: Diagnosis not present

## 2015-03-26 DIAGNOSIS — L8990 Pressure ulcer of unspecified site, unspecified stage: Secondary | ICD-10-CM | POA: Diagnosis not present

## 2015-03-26 DIAGNOSIS — R778 Other specified abnormalities of plasma proteins: Secondary | ICD-10-CM

## 2015-03-26 DIAGNOSIS — D649 Anemia, unspecified: Secondary | ICD-10-CM | POA: Diagnosis present

## 2015-03-26 DIAGNOSIS — Z87820 Personal history of traumatic brain injury: Secondary | ICD-10-CM | POA: Diagnosis not present

## 2015-03-26 DIAGNOSIS — R0602 Shortness of breath: Secondary | ICD-10-CM

## 2015-03-26 DIAGNOSIS — Z452 Encounter for adjustment and management of vascular access device: Secondary | ICD-10-CM

## 2015-03-26 DIAGNOSIS — E780 Pure hypercholesterolemia: Secondary | ICD-10-CM | POA: Diagnosis present

## 2015-03-26 DIAGNOSIS — R109 Unspecified abdominal pain: Secondary | ICD-10-CM

## 2015-03-26 DIAGNOSIS — J9601 Acute respiratory failure with hypoxia: Secondary | ICD-10-CM | POA: Diagnosis present

## 2015-03-26 DIAGNOSIS — N179 Acute kidney failure, unspecified: Secondary | ICD-10-CM | POA: Diagnosis present

## 2015-03-26 DIAGNOSIS — J449 Chronic obstructive pulmonary disease, unspecified: Secondary | ICD-10-CM | POA: Diagnosis present

## 2015-03-26 DIAGNOSIS — E871 Hypo-osmolality and hyponatremia: Secondary | ICD-10-CM | POA: Diagnosis not present

## 2015-03-26 DIAGNOSIS — I447 Left bundle-branch block, unspecified: Secondary | ICD-10-CM | POA: Diagnosis present

## 2015-03-26 LAB — CBC
HCT: 29.6 % — ABNORMAL LOW (ref 39.0–52.0)
Hemoglobin: 9.6 g/dL — ABNORMAL LOW (ref 13.0–17.0)
MCH: 33.9 pg (ref 26.0–34.0)
MCHC: 32.4 g/dL (ref 30.0–36.0)
MCV: 104.6 fL — AB (ref 78.0–100.0)
PLATELETS: 360 10*3/uL (ref 150–400)
RBC: 2.83 MIL/uL — ABNORMAL LOW (ref 4.22–5.81)
RDW: 15.4 % (ref 11.5–15.5)
WBC: 17.7 10*3/uL — ABNORMAL HIGH (ref 4.0–10.5)

## 2015-03-26 LAB — TROPONIN I
TROPONIN I: 3.64 ng/mL — AB (ref <0.031)
TROPONIN I: 7.36 ng/mL — AB (ref <0.031)
TROPONIN I: 5.08 ng/mL — AB (ref <0.031)
Troponin I: 1.3 ng/mL (ref <0.031)

## 2015-03-26 LAB — I-STAT TROPONIN, ED: Troponin i, poc: 0.67 ng/mL (ref 0.00–0.08)

## 2015-03-26 LAB — HEPATIC FUNCTION PANEL
ALK PHOS: 59 U/L (ref 38–126)
ALT: 7 U/L — ABNORMAL LOW (ref 17–63)
AST: 30 U/L (ref 15–41)
Albumin: 1.8 g/dL — ABNORMAL LOW (ref 3.5–5.0)
BILIRUBIN DIRECT: 0.1 mg/dL (ref 0.1–0.5)
BILIRUBIN INDIRECT: 0.2 mg/dL — AB (ref 0.3–0.9)
Total Bilirubin: 0.3 mg/dL (ref 0.3–1.2)
Total Protein: 5.5 g/dL — ABNORMAL LOW (ref 6.5–8.1)

## 2015-03-26 LAB — BASIC METABOLIC PANEL
Anion gap: 14 (ref 5–15)
BUN: 20 mg/dL (ref 6–20)
CHLORIDE: 97 mmol/L — AB (ref 101–111)
CO2: 23 mmol/L (ref 22–32)
CREATININE: 1.58 mg/dL — AB (ref 0.61–1.24)
Calcium: 8.7 mg/dL — ABNORMAL LOW (ref 8.9–10.3)
GFR calc non Af Amer: 44 mL/min — ABNORMAL LOW (ref >60)
GFR, EST AFRICAN AMERICAN: 51 mL/min — AB (ref >60)
Glucose, Bld: 190 mg/dL — ABNORMAL HIGH (ref 65–99)
Potassium: 4.2 mmol/L (ref 3.5–5.1)
Sodium: 134 mmol/L — ABNORMAL LOW (ref 135–145)

## 2015-03-26 LAB — I-STAT CG4 LACTIC ACID, ED
LACTIC ACID, VENOUS: 6.49 mmol/L — AB (ref 0.5–2.0)
LACTIC ACID, VENOUS: 2.05 mmol/L — AB (ref 0.5–2.0)
Lactic Acid, Venous: 3.55 mmol/L (ref 0.5–2.0)

## 2015-03-26 LAB — LACTIC ACID, PLASMA
Lactic Acid, Venous: 2.2 mmol/L (ref 0.5–2.0)
Lactic Acid, Venous: 0.9 mmol/L (ref 0.5–2.0)

## 2015-03-26 LAB — BRAIN NATRIURETIC PEPTIDE: B NATRIURETIC PEPTIDE 5: 3309.9 pg/mL — AB (ref 0.0–100.0)

## 2015-03-26 LAB — LIPASE, BLOOD: LIPASE: 42 U/L (ref 22–51)

## 2015-03-26 LAB — TSH: TSH: 2.516 u[IU]/mL (ref 0.350–4.500)

## 2015-03-26 LAB — HEPARIN LEVEL (UNFRACTIONATED): Heparin Unfractionated: <0.1 IU/mL — ABNORMAL LOW (ref 0.30–0.70)

## 2015-03-26 MED ORDER — HEPARIN BOLUS VIA INFUSION
3000.0000 [IU] | Freq: Once | INTRAVENOUS | Status: AC
  Administered 2015-03-26: 3000 [IU] via INTRAVENOUS
  Filled 2015-03-26: qty 3000

## 2015-03-26 MED ORDER — SODIUM CHLORIDE 0.9 % IV BOLUS (SEPSIS)
500.0000 mL | Freq: Once | INTRAVENOUS | Status: AC
  Administered 2015-03-26: 500 mL via INTRAVENOUS

## 2015-03-26 MED ORDER — NITROGLYCERIN 0.4 MG SL SUBL
0.4000 mg | SUBLINGUAL_TABLET | SUBLINGUAL | Status: DC | PRN
  Administered 2015-04-01: 0.4 mg via SUBLINGUAL
  Filled 2015-03-26: qty 1

## 2015-03-26 MED ORDER — ASPIRIN 81 MG PO CHEW
324.0000 mg | CHEWABLE_TABLET | ORAL | Status: AC
  Administered 2015-03-26: 324 mg via ORAL
  Filled 2015-03-26: qty 4

## 2015-03-26 MED ORDER — SODIUM CHLORIDE 0.9 % IJ SOLN
3.0000 mL | Freq: Two times a day (BID) | INTRAMUSCULAR | Status: DC
  Administered 2015-03-26 – 2015-03-29 (×4): 3 mL via INTRAVENOUS
  Filled 2015-03-26: qty 3

## 2015-03-26 MED ORDER — ACETAMINOPHEN 325 MG PO TABS: 650.0000 mg | ORAL_TABLET | ORAL | Status: DC | PRN

## 2015-03-26 MED ORDER — FUROSEMIDE 20 MG PO TABS
40.0000 mg | ORAL_TABLET | ORAL | Status: DC
  Filled 2015-03-26: qty 2

## 2015-03-26 MED ORDER — SODIUM CHLORIDE 0.9 % IV SOLN: 500.0000 mg | Freq: Two times a day (BID) | INTRAVENOUS | Status: DC

## 2015-03-26 MED ORDER — DOPAMINE-DEXTROSE 3.2-5 MG/ML-% IV SOLN
4.0000 ug/kg/min | INTRAVENOUS | Status: DC
  Administered 2015-03-26 – 2015-03-28 (×2): 4 ug/kg/min via INTRAVENOUS
  Filled 2015-03-26 (×2): qty 250

## 2015-03-26 MED ORDER — PIPERACILLIN-TAZOBACTAM 3.375 G IVPB 30 MIN
3.3750 g | INTRAVENOUS | Status: DC
  Administered 2015-03-26: 3.375 g via INTRAVENOUS
  Filled 2015-03-26: qty 50

## 2015-03-26 MED ORDER — ASPIRIN 325 MG PO TABS: 325.0000 mg | ORAL_TABLET | Freq: Every day | ORAL | Status: DC

## 2015-03-26 MED ORDER — HEPARIN (PORCINE) IN NACL 100-0.45 UNIT/ML-% IJ SOLN
1350.0000 [IU]/h | INTRAMUSCULAR | Status: DC
  Administered 2015-03-26: 850 [IU]/h via INTRAVENOUS
  Administered 2015-03-27 – 2015-03-29 (×3): 1350 [IU]/h via INTRAVENOUS
  Filled 2015-03-26 (×9): qty 250

## 2015-03-26 MED ORDER — FUROSEMIDE 40 MG PO TABS
40.0000 mg | ORAL_TABLET | Freq: Every day | ORAL | Status: DC
  Administered 2015-03-27 – 2015-04-04 (×9): 40 mg via ORAL
  Filled 2015-03-26 (×10): qty 1

## 2015-03-26 MED ORDER — CETYLPYRIDINIUM CHLORIDE 0.05 % MT LIQD
7.0000 mL | Freq: Two times a day (BID) | OROMUCOSAL | Status: DC
  Administered 2015-03-26 – 2015-04-09 (×27): 7 mL via OROMUCOSAL

## 2015-03-26 MED ORDER — PRAVASTATIN SODIUM 40 MG PO TABS
40.0000 mg | ORAL_TABLET | Freq: Every day | ORAL | Status: DC
  Administered 2015-03-26: 40 mg via ORAL
  Filled 2015-03-26: qty 1

## 2015-03-26 MED ORDER — SODIUM CHLORIDE 0.9 % IV SOLN: 250.0000 mL | INTRAVENOUS | Status: DC | PRN

## 2015-03-26 MED ORDER — VANCOMYCIN HCL IN DEXTROSE 1-5 GM/200ML-% IV SOLN
1000.0000 mg | INTRAVENOUS | Status: AC
  Administered 2015-03-26: 1000 mg via INTRAVENOUS
  Filled 2015-03-26: qty 200

## 2015-03-26 MED ORDER — DIVALPROEX SODIUM 500 MG PO DR TAB
500.0000 mg | DELAYED_RELEASE_TABLET | Freq: Three times a day (TID) | ORAL | Status: DC
  Administered 2015-03-26 – 2015-04-04 (×30): 500 mg via ORAL
  Filled 2015-03-26 (×11): qty 1
  Filled 2015-03-26: qty 2
  Filled 2015-03-26 (×19): qty 1
  Filled 2015-03-26: qty 2
  Filled 2015-03-26: qty 1

## 2015-03-26 MED ORDER — ASPIRIN EC 81 MG PO TBEC
81.0000 mg | DELAYED_RELEASE_TABLET | Freq: Every day | ORAL | Status: DC
  Administered 2015-03-27 – 2015-04-09 (×12): 81 mg via ORAL
  Filled 2015-03-26 (×14): qty 1

## 2015-03-26 MED ORDER — PIPERACILLIN-TAZOBACTAM 3.375 G IVPB: 3.3750 g | Freq: Three times a day (TID) | INTRAVENOUS | Status: DC

## 2015-03-26 MED ORDER — ASPIRIN 300 MG RE SUPP: 300.0000 mg | RECTAL | Status: AC

## 2015-03-26 MED ORDER — NITROGLYCERIN 2 % TD OINT
0.5000 [in_us] | TOPICAL_OINTMENT | Freq: Four times a day (QID) | TRANSDERMAL | Status: DC
  Administered 2015-03-26 – 2015-04-04 (×33): 0.5 [in_us] via TOPICAL
  Filled 2015-03-26 (×5): qty 30
  Filled 2015-03-26: qty 1
  Filled 2015-03-26 (×13): qty 30

## 2015-03-26 MED ORDER — ACETAMINOPHEN 325 MG PO TABS
650.0000 mg | ORAL_TABLET | ORAL | Status: DC | PRN
  Administered 2015-03-26 – 2015-03-30 (×2): 650 mg via ORAL
  Filled 2015-03-26 (×3): qty 2

## 2015-03-26 MED ORDER — SODIUM CHLORIDE 0.9 % IJ SOLN: 3.0000 mL | INTRAMUSCULAR | Status: DC | PRN

## 2015-03-26 MED ORDER — FUROSEMIDE 10 MG/ML IJ SOLN
40.0000 mg | Freq: Once | INTRAMUSCULAR | Status: AC
  Administered 2015-03-26: 40 mg via INTRAVENOUS
  Filled 2015-03-26: qty 4

## 2015-03-26 MED ORDER — PIPERACILLIN-TAZOBACTAM 3.375 G IVPB 30 MIN: 3.3750 g | Freq: Three times a day (TID) | INTRAVENOUS | Status: DC

## 2015-03-26 MED ORDER — ONDANSETRON HCL 4 MG/2ML IJ SOLN
4.0000 mg | Freq: Four times a day (QID) | INTRAMUSCULAR | Status: DC | PRN
  Administered 2015-04-01 – 2015-04-05 (×7): 4 mg via INTRAVENOUS
  Filled 2015-03-26 (×7): qty 2

## 2015-03-26 NOTE — ED Notes (Signed)
Patient is from Home. States he woke up in middle night with Chest Pain radiating to left arm . Patient has HX of MI x3 and states never has EKG changes.  Patient states pain does not feel like MI in past, Patient is Clammy, diphortic, Patient also complained of nausea. 4 MG of zofran given NO ASA due to patient allergies No Nitro.

## 2015-03-26 NOTE — ED Notes (Signed)
Critical Lactic Acid result given to Dr. Katrinka BlazingSmith. -- Lactic Acid 2.2.

## 2015-03-26 NOTE — ED Notes (Signed)
MD states okay to give lasix with patients BP.

## 2015-03-26 NOTE — H&P (Signed)
[facility] History and Physical  Nestor LewandowskyRobert C Beckum II QIH:[id number] DOB: [birth year] DOA: 03/26/2015  Referring physician: EDP PCP: Gildardo CrankerHess, Bryan, DO   Chief Complaint: Chest pain, respiratory distress   HPI: Austin GamblesRobert C Croll II is a 64 y.o. male h/o CAD s/p CABG, MI x3, CHF with EF 35-40%.  Patient presents to ED with c/o sudden onset left sided chest pain and SOB.  Symptoms onset suddenly just PTA, CP is ongoing even now.  Patient now states CP is of quality like his prior MIs.  He has had some worse BLE edema since his hospital discharge for PNA 2 weeks ago, but most of his excess fluid he managed to lose in the first couple of days of being at home.  Patient was put on a BIPAP for respiratory distress on arrival.   Review of Systems: Systems reviewed.  As above, otherwise negative  Past Medical History  Diagnosis Date  . Seizure disorder   . Nausea   . History of heartburn   . Chronic back pain   . IHD (ischemic heart disease)   . Cigarette smoker   . Hypercholesterolemia   . CHF (congestive heart failure)     EF had been 20% initially. 2010 EF 35-40%.  . Traumatic brain injury   . Seizures   . COPD (chronic obstructive pulmonary disease)   . Pancreatitis    Past Surgical History  Procedure Laterality Date  . Cardiac catheterization  03/12/2007  . Coronary artery bypass graft  2000    x6, graft to the PDA and LAD, patent saphenous vein graft to acute marginal and posterior lateral severely diseased saphenous vein graft to diagonal. Patent LIMA to the LAD.  Marland Kitchen. Shoulder surgery      right  . Facial reconstruction surgery    . Back surgery     Social History:  reports that he has been smoking Cigarettes.  He has a 37.5 pack-year smoking history. He has never used smokeless tobacco. He reports that he does not drink alcohol or use illicit drugs.  Allergies  Allergen Reactions  . Aspirin     To much aspirin make his bleed on the inside can take a small amount.  . Flomax  [Tamsulosin Hcl] Swelling  . Morphine And Related Other (See Comments)    "MAKES HIM MEAN"    Family History  Problem Relation Age of Onset  . Heart disease Brother   . Hypertension Brother   . Heart attack Brother   . Heart disease Sister   . Coronary artery disease Brother   . Hypertension Brother   . Heart attack Brother   . Coronary artery disease Sister   . Diabetes Brother   . Thyroid disease Brother   . Heart attack Brother   . Diabetes Father   . Heart disease Father   . Hypertension Father   . Heart attack Father   . Hypertension Mother   . Varicose Veins Mother      Prior to Admission medications   Medication Sig Start Date End Date Taking? Authorizing Provider  aspirin 325 MG tablet Take 325 mg by mouth daily.     Yes Historical Provider, MD  divalproex (DEPAKOTE) 500 MG EC tablet Take 500 mg by mouth 3 (three) times daily. [facility]   Yes Historical Provider, MD  furosemide (LASIX) 40 MG tablet TAKE ONE TABLET BY MOUTH EVERY OTHER DAY   Yes Rollene RotundaJames Hochrein, MD  liver oil-zinc oxide (  DESITIN) 40 % ointment Apply topically every morning. 03/10/15  Yes Yolande Jolly, MD  pravastatin (PRAVACHOL) 40 MG tablet Take 1 tablet (40 mg total) by mouth daily. 05/14/14  Yes Rollene Rotunda, MD  ranitidine (ZANTAC) 150 MG capsule Take 300 mg by mouth daily.    Yes Historical Provider, MD   Physical Exam: Filed Vitals:   03/26/15 0615  BP: 73/52  Pulse: 77  Temp:   Resp: 23    BP 73/52 mmHg  Pulse 77  Temp(Src) 97.7 F (36.5 C) (Oral)  Resp 23  Ht 6' (1.829 m)  Wt 71.5 kg (157 lb 10.1 oz)  BMI 21.37 kg/m2  SpO2 99%  General Appearance:    Alert, oriented, no distress, appears stated age  Head:    Normocephalic, atraumatic  Eyes:    PERRL, EOMI, sclera non-icteric        Nose:   Nares without drainage or epistaxis. Mucosa, turbinates normal  Throat:   Moist mucous membranes. Oropharynx without erythema or exudate.  Neck:    Supple. No carotid bruits.  No thyromegaly.  No lymphadenopathy.   Back:     No CVA tenderness, no spinal tenderness  Lungs:     B Crackles  Chest wall:    No tenderness to palpitation  Heart:    Regular rate and rhythm without murmurs, gallops, rubs  Abdomen:     Soft, non-tender, nondistended, normal bowel sounds, no organomegaly  Genitalia:    deferred  Rectal:    deferred  Extremities:   No clubbing, cyanosis or edema.  Pulses:   2+ and symmetric all extremities  Skin:   Skin color, texture, turgor normal, no rashes or lesions  Lymph nodes:   Cervical, supraclavicular, and axillary nodes normal  Neurologic:   CNII-XII intact. Normal strength, sensation and reflexes      throughout    Labs on Admission:  Basic Metabolic Panel:  Recent Labs Lab 03/26/15 0229  NA 134*  K 4.2  CL 97*  CO2 23  GLUCOSE 190*  BUN 20  CREATININE 1.58*  CALCIUM 8.7*   Liver Function Tests:  Recent Labs Lab 03/26/15 0439  AST 30  ALT 7*  ALKPHOS 59  BILITOT 0.3  PROT 5.5*  ALBUMIN 1.8*    Recent Labs Lab 03/26/15 0439  LIPASE 42   No results for input(s): AMMONIA in the last 168 hours. CBC:  Recent Labs Lab 03/26/15 0229  WBC 17.7*  HGB 9.6*  HCT 29.6*  MCV 104.6*  PLT 360   Cardiac Enzymes:  Recent Labs Lab 03/26/15 0439  TROPONINI 1.30*    BNP (last 3 results) No results for input(s): PROBNP in the last 8760 hours. CBG: No results for input(s): GLUCAP in the last 168 hours.  Radiological Exams on Admission: Dg Chest Port 1 View  03/26/2015   CLINICAL DATA:  Acute onset of shortness of breath. Initial encounter.  EXAM: PORTABLE CHEST - 1 VIEW  COMPARISON:  Chest radiograph performed 03/07/2015  FINDINGS: The lungs are well-aerated. Vascular congestion is noted. Mild bibasilar opacities raise concern for mild interstitial edema. There is no evidence of pleural effusion or pneumothorax.  The cardiomediastinal silhouette is borderline normal in size. The patient is  status post median sternotomy, with evidence of prior CABG. No acute osseous abnormalities are seen.  IMPRESSION: Vascular congestion noted. Mild bibasilar opacities raise concern for mild interstitial edema.   Electronically Signed   By: Roanna Raider M.D.   On: 03/26/2015 02:56  EKG: Independently reviewed.  Assessment/Plan Principal Problem:   NSTEMI (non-ST elevated myocardial infarction) Active Problems:   S/P CABG (coronary artery bypass graft)   Chronic systolic congestive heart failure   Flash pulmonary edema   AKI (acute kidney injury)   Acute respiratory failure with hypoxia   1. NSTEMI - flash pulmonary edema and elevating troponin with left sided chest pain in a patient with known CAD.  ACS seems to be the most likely diagnosis at this point in time. 1. Cardiology consulted and on way to see patient 2. Heparin gtt 3. ASA ordered 4. NTG not ordered due to low BPs (currently 79 systolic) 5. Morphine not ordered due to low BPs and intolerance to morphine. 6. May end up on pressors for cardiogenic shock, will defer this decision to cardiology.  Have not pushed for vasopressors yet because his BPs have been running 80s to 90s systolic during multiple office visits over the past couple of months.  His MAP is 62 at this time. 7. NPO for possible intervention 8. Statin ordered 9. Cannot order beta blocker due to low BP 10. Decision regarding echo vs cath lab as means of evaluating LV systolic function deferred to cardiology. 2. Acute respiratory failure with hypoxia - BIPAP PRN 3. AKI - 1. Trend creatinine 2. Monitor intake and output 4. Leukocytosis - suspect this is reactive, no source of infection identified, and no other SIRS. 5. Lactic acidosis - checking serial lactates    Code Status: Full Code  Family Communication: Family at bedside Disposition Plan: Admit to inpatient   Time spent: 70 min  GARDNER, JARED M. [facility] Pager [phone]  If 7AM-7PM,  please contact the day team taking care of the patient [email or web address] Password [facility] 03/26/2015, 6:24 AM

## 2015-03-26 NOTE — ED Provider Notes (Signed)
CSN: [id number]     Arrival date & time 03/26/15  0146 History   None    This chart was scribed for Austin Severin, MD by Arlan Organ, ED Scribe. This patient was seen in room A13C/A13C and the patient's care was started 2:10 AM.   Chief Complaint  Patient presents with  . Chest Pain   The history is provided by the patient and a relative. No language interpreter was used.    HPI Comments: [name] brought in by EMS from home is a 64 y.o. male with a PMHx of MI x 3, IHD, CHF, hypercholesterolemia, and COPD who presents to the Emergency Department complaining of sudden onset, ongoing, unchanged chest pain that radiates to the L arm onset just prior to arrival. Denies current pain feeling like previous MIs. Pt also reports shortness of breath, wheezing, nausea, and worsening BLE leg swelling. 4 mg of Zofran given en route to department. No Nitro or ASA given prior to EMS arrival or en route. No recent fever or chills. Pt is on Lasix every other day. Last dose this morning. Mr. [name] was diagnosed with PNA over 2 weeks ago. However, he has completed all antibiotics. PSHx includes CABG-2000. Pt with known allergies to ASA, Morphine, and Flomax.  He is followed by Dr. Rollene Rotunda [facility]  Past Medical History  Diagnosis Date  . Seizure disorder   . Nausea   . History of heartburn   . Chronic back pain   . IHD (ischemic heart disease)   . Cigarette smoker   . Hypercholesterolemia   . CHF (congestive heart failure)     EF had been 20% initially. 2010 EF 35-40%.  . Traumatic brain injury   . Seizures   . COPD (chronic obstructive pulmonary disease)   . Pancreatitis    Past Surgical History  Procedure Laterality Date  . Cardiac catheterization  03/12/2007  . Coronary artery bypass graft  2000    x6, graft to the PDA and LAD, patent saphenous vein graft to acute marginal and posterior lateral severely diseased saphenous vein graft to diagonal. Patent LIMA to the LAD.  Marland Kitchen Shoulder  surgery      right  . Facial reconstruction surgery    . Back surgery     Family History  Problem Relation Age of Onset  . Heart disease Brother   . Hypertension Brother   . Heart attack Brother   . Heart disease Sister   . Coronary artery disease Brother   . Hypertension Brother   . Heart attack Brother   . Coronary artery disease Sister   . Diabetes Brother   . Thyroid disease Brother   . Heart attack Brother   . Diabetes Father   . Heart disease Father   . Hypertension Father   . Heart attack Father   . Hypertension Mother   . Varicose Veins Mother    History  Substance Use Topics  . Smoking status: Current Every Day Smoker -- 0.75 packs/day for 50 years    Types: Cigarettes  . Smokeless tobacco: Never Used  . Alcohol Use: No    Review of Systems  Constitutional: Negative for fever and chills.  Respiratory: Positive for cough, shortness of breath and wheezing.   Cardiovascular: Positive for chest pain and leg swelling.  Gastrointestinal: Positive for nausea. Negative for vomiting.  Musculoskeletal: Positive for arthralgias.  Skin: Negative for rash.  Psychiatric/Behavioral: Negative for confusion.      Allergies  Aspirin; Flomax; and Morphine and related  Home Medications   Prior to Admission medications   Medication Sig Start Date End Date Taking? Authorizing Provider  aspirin 325 MG tablet Take 325 mg by mouth daily.      Historical Provider, MD  divalproex (DEPAKOTE) 500 MG EC tablet Take 500 mg by mouth 3 (three) times daily. Prescribed by  his neurologist in [facility]    Historical Provider, MD  furosemide (LASIX) 40 MG tablet TAKE ONE TABLET BY MOUTH EVERY OTHER DAY    Rollene Rotunda, MD  liver oil-zinc oxide (DESITIN) 40 % ointment Apply topically every morning. 03/10/15   Yolande Jolly, MD  pravastatin (PRAVACHOL) 40 MG tablet Take 1 tablet (40 mg total) by mouth daily. 05/14/14   Rollene Rotunda, MD  ranitidine (ZANTAC) 150 MG capsule Take 300 mg  by mouth daily.     Historical Provider, MD   Triage Vitals: BP 119/85 mmHg  Pulse 135  Temp(Src) 97.7 F (36.5 C) (Oral)  Resp 27  SpO2 93%   Physical Exam  Constitutional: He is oriented to person, place, and time. He appears well-developed and well-nourished. He appears distressed.  HENT:  Head: Normocephalic and atraumatic.  Nose: Nose normal.  Mouth/Throat: Oropharynx is clear and moist.  Eyes: Conjunctivae and EOM are normal. Pupils are equal, round, and reactive to light.  Neck: Normal range of motion. Neck supple. No JVD present. No tracheal deviation present. No thyromegaly present.  Cardiovascular: Regular rhythm, normal heart sounds and intact distal pulses.  Exam reveals no gallop and no friction rub.   No murmur heard. tachycardia  Pulmonary/Chest: No stridor. He is in respiratory distress. He has wheezes. He has rales. He exhibits no tenderness.  Abdominal: Soft. Bowel sounds are normal. He exhibits no distension and no mass. There is no tenderness. There is no rebound and no guarding.  Musculoskeletal: Normal range of motion. He exhibits edema (2+). He exhibits no tenderness.  Lymphadenopathy:    He has no cervical adenopathy.  Neurological: He is alert and oriented to person, place, and time. He displays normal reflexes. He exhibits normal muscle tone. Coordination normal.  Skin: Skin is warm and dry. No rash noted. No erythema. No pallor.  Psychiatric: He has a normal mood and affect. His behavior is normal. Judgment and thought content normal.  Nursing note and vitals reviewed.   ED Course  Procedures (including critical care time)  DIAGNOSTIC STUDIES: Oxygen Saturation is 93% on 5L of Oxygen, adequate by my interpretation.    COORDINATION OF CARE: 2:20 AM- Will order CXR, blood culture, i-stat CG4 lactic acid, CBC, BMP, BNP, and i-stat troponin. Will place pt on BPAP. Discussed treatment plan with pt at bedside and pt agreed to plan.     Labs Review Labs  Reviewed  CBC - Abnormal; Notable for the following:    WBC 17.7 (*)    RBC 2.83 (*)    Hemoglobin 9.6 (*)    HCT 29.6 (*)    MCV 104.6 (*)    All other components within normal limits  BASIC METABOLIC PANEL - Abnormal; Notable for the following:    Sodium 134 (*)    Chloride 97 (*)    Glucose, Bld 190 (*)    Creatinine, Ser 1.58 (*)    Calcium 8.7 (*)    GFR calc non Af Amer 44 (*)    GFR calc Af Amer 51 (*)    All other components within normal limits  BRAIN NATRIURETIC  PEPTIDE - Abnormal; Notable for the following:    B Natriuretic Peptide 3309.9 (*)    All other components within normal limits  TROPONIN I - Abnormal; Notable for the following:    Troponin I 1.30 (*)    All other components within normal limits  I-STAT TROPOININ, ED - Abnormal; Notable for the following:    Troponin i, poc 0.67 (*)    All other components within normal limits  I-STAT CG4 LACTIC ACID, ED - Abnormal; Notable for the following:    Lactic Acid, Venous 6.49 (*)    All other components within normal limits  I-STAT CG4 LACTIC ACID, ED - Abnormal; Notable for the following:    Lactic Acid, Venous 3.55 (*)    All other components within normal limits  CULTURE, BLOOD (ROUTINE X 2)  CULTURE, BLOOD (ROUTINE X 2)  LIPASE, BLOOD  HEPATIC FUNCTION PANEL  I-STAT CG4 LACTIC ACID, ED    Imaging Review Dg Chest Port 1 View  03/26/2015   CLINICAL DATA:  Acute onset of shortness of breath. Initial encounter.  EXAM: PORTABLE CHEST - 1 VIEW  COMPARISON:  Chest radiograph performed 03/07/2015  FINDINGS: The lungs are well-aerated. Vascular congestion is noted. Mild bibasilar opacities raise concern for mild interstitial edema. There is no evidence of pleural effusion or pneumothorax.  The cardiomediastinal silhouette is borderline normal in size. The patient is status post median sternotomy, with evidence of prior CABG. No acute osseous abnormalities are seen.  IMPRESSION: Vascular congestion noted. Mild bibasilar  opacities raise concern for mild interstitial edema.   Electronically Signed   By: Roanna RaiderJeffery  Chang M.D.   On: 03/26/2015 02:56     EKG Interpretation   Date/Time:  Friday March 26 2015 01:56:20 EDT Ventricular Rate:  134 PR Interval:  114 QRS Duration: 110 QT Interval:  354 QTC Calculation: 529 R Axis:   76 Text Interpretation:  Sinus tachycardia Incomplete left bundle branch  block Prolonged QT interval Baseline wander in lead(s) III Confirmed by  Shawndra Clute  MD, Jennelle Pinkstaff ([id number]) on 03/26/2015 4:35:01 AM     CRITICAL CARE Performed by: Olivia MackieTTER,Lidiya Reise M Total critical care time:60 min Critical care time was exclusive of separately billable procedures and treating other patients. Critical care was necessary to treat or prevent imminent or life-threatening deterioration. Critical care was time spent personally by me on the following activities: development of treatment plan with patient and/or surrogate as well as nursing, discussions with consultants, evaluation of patient's response to treatment, examination of patient, obtaining history from patient or surrogate, ordering and performing treatments and interventions, ordering and review of laboratory studies, ordering and review of radiographic studies, pulse oximetry and re-evaluation of patient's condition. MDM   Final diagnoses:  SOB (shortness of breath)  CHF exacerbation  Elevated troponin  Respiratory distress, acute   64 year old male who presents with respiratory distress from home.  Per wife and patient.  Symptoms started suddenly.  Recent pneumonia 2 weeks ago.  Some increased cough.  History of COPD, but not on medications.  Some chest pain, but not similar to prior MIs.  EKG without ischemic changes.  Patient recently taken off blood pressure medicines.  He denies any dietary discussed indiscretions.  Patient has noted some swelling in his legs, but actually improved.  Seen by his cardiologist earlier this week.  Differential includes CHF  exacerbation with flash pulmonary edema, hospital-acquired pneumonia.  We'll cover with anti-biotics, given elevated lactate, place on BiPAP, given respiratory distress.  6:04 AM Patient clinically  looking better, lactate is improving.  Troponin is going up.  Hospitalist has seen the patient, cardiology to consult.  I personally performed the services described in this documentation, which was scribed in my presence. The recorded information has been reviewed and is accurate.    Austin Severin, MD 03/26/15 [phone]

## 2015-03-26 NOTE — ED Notes (Signed)
Pt not on bi pap.  Pt on 4 liters nasal canula.  Cardiologist at bedside.

## 2015-03-26 NOTE — ED Notes (Signed)
MD notified of Critical Troponin

## 2015-03-26 NOTE — Progress Notes (Signed)
ANTIBIOTIC CONSULT NOTE - INITIAL  Pharmacy Consult for Vancomycin Indication: pneumonia  Allergies  Allergen Reactions  . Aspirin     To much aspirin make his bleed on the inside can take a small amount.  . Flomax [Tamsulosin Hcl] Swelling  . Morphine And Related Other (See Comments)    "MAKES HIM MEAN"    Patient Measurements:    Vital Signs: Temp: 97.7 F (36.5 C) (06/03 0201) Temp Source: Oral (06/03 0201) BP: 88/59 mmHg (06/03 0345) Pulse Rate: 87 (06/03 0345) Intake/Output from previous day:   Intake/Output from this shift:    Labs:  Recent Labs  03/26/15 0229  WBC 17.7*  HGB 9.6*  PLT 360  CREATININE 1.58*   Estimated Creatinine Clearance: 46.1 mL/min (by C-G formula based on Cr of 1.58). No results for input(s): VANCOTROUGH, VANCOPEAK, VANCORANDOM, GENTTROUGH, GENTPEAK, GENTRANDOM, TOBRATROUGH, TOBRAPEAK, TOBRARND, AMIKACINPEAK, AMIKACINTROU, AMIKACIN in the last 72 hours.   Microbiology: Recent Results (from the past 720 hour(s))  Culture, blood (routine x 2)     Status: None   Collection Time: 02/28/15  1:38 AM  Result Value Ref Range Status   Specimen Description BLOOD LEFT ANTECUBITAL  Final   Special Requests BOTTLES DRAWN AEROBIC ONLY 7CC  Final   Culture   Final    NO GROWTH 5 DAYS Performed at [facility]    Report Status 03/06/2015 FINAL  Final  Culture, blood (routine x 2)     Status: None   Collection Time: 02/28/15  1:42 AM  Result Value Ref Range Status   Specimen Description BLOOD RIGHT ANTECUBITAL  Final   Special Requests BOTTLES DRAWN AEROBIC ONLY 10CC  Final   Culture   Final    NO GROWTH 5 DAYS Performed at [facility]    Report Status 03/06/2015 FINAL  Final  Urine culture     Status: None   Collection Time: 03/07/15  3:32 PM  Result Value Ref Range Status   Specimen Description URINE, CATHETERIZED  Final   Special Requests NONE  Final   Colony Count   Final    >=100,000 COLONIES/ML Performed at  [facility]    Culture   Final    KLEBSIELLA PNEUMONIAE Performed at [facility]    Report Status 03/09/2015 FINAL  Final   Organism ID, Bacteria KLEBSIELLA PNEUMONIAE  Final      Susceptibility   Klebsiella pneumoniae - MIC*    AMPICILLIN >=32 RESISTANT Resistant     CEFAZOLIN <=4 SENSITIVE Sensitive     CEFTRIAXONE <=1 SENSITIVE Sensitive     CIPROFLOXACIN <=0.25 SENSITIVE Sensitive     GENTAMICIN <=1 SENSITIVE Sensitive     LEVOFLOXACIN <=0.12 SENSITIVE Sensitive     NITROFURANTOIN 64 INTERMEDIATE Intermediate     TOBRAMYCIN <=1 SENSITIVE Sensitive     TRIMETH/SULFA <=20 SENSITIVE Sensitive     PIP/TAZO <=4 SENSITIVE Sensitive     * KLEBSIELLA PNEUMONIAE  Culture, blood (routine x 2)     Status: None   Collection Time: 03/07/15  6:02 PM  Result Value Ref Range Status   Specimen Description BLOOD LEFT WRIST  Final   Special Requests BOTTLES DRAWN AEROBIC AND ANAEROBIC 5CC  Final   Culture   Final    NO GROWTH 5 DAYS Performed at [facility]    Report Status 03/13/2015 FINAL  Final  Culture, blood (routine x 2)     Status: None   Collection Time: 03/07/15  6:03 PM  Result Value  Ref Range Status   Specimen Description BLOOD RIGHT HAND  Final   Special Requests BOTTLES DRAWN AEROBIC AND ANAEROBIC 5CC  Final   Culture   Final    NO GROWTH 5 DAYS Performed at [facility]    Report Status 03/13/2015 FINAL  Final    Medical History: Past Medical History  Diagnosis Date  . Seizure disorder   . Nausea   . History of heartburn   . Chronic back pain   . IHD (ischemic heart disease)   . Cigarette smoker   . Hypercholesterolemia   . CHF (congestive heart failure)     EF had been 20% initially. 2010 EF 35-40%.  . Traumatic brain injury   . Seizures   . COPD (chronic obstructive pulmonary disease)   . Pancreatitis     Medications:  See electronic med rec  Assessment: 64 y.o. male presents with chest pain. Pt to begin Vancomycin  and Zosyn for HCAP. Noted that pt with recent hospitalization for HCAP/UTI - discharged on 5/18. Afeb. WBC elevated to 17.7. LA 6.49. SCr 1.58, est CrCl 46 ml/min  Goal of Therapy:  Vancomycin trough level 15-20 mcg/ml  Plan:  Continue Zosyn 3.375gm IV q8h - each dose over 4 hours Vancomycin 1gm IV now then 500mg  IV q12h Will f/u renal function, micro data, and pt's clinical conditin Vanc trough prn  Christoper Fabianaron Saketh Daubert, PharmD, BCPS Clinical pharmacist, pager [phone] 03/26/2015,4:00 AM

## 2015-03-26 NOTE — Progress Notes (Signed)
Patient is resting comfortably on 4LNC with sats of 95 and all vitals are stable. HR 95, RR 18, BP 116/73. BIPAP is not needed at this time.

## 2015-03-26 NOTE — ED Notes (Signed)
Paged Cards-per RN

## 2015-03-26 NOTE — Progress Notes (Signed)
LCSW was approached by patient wife in effort to have new referrals for primary care. [facility] and [facility].  Wife: Darl PikesSusan:: [phone]  Wife also reports she is wanting home health restarted for patient.  Have used [facility] and hoping for same home health agency to be involved.  Wife aware patient is being admitted. Explained SW role and CM role and follow up. Wife agreeable.  Patient pending bed at this time.  Deretha EmoryHannah Sueko Dimichele LCSW, MSW Clinical Social Work: Emergency Room [phone]

## 2015-03-26 NOTE — ED Notes (Signed)
This nurse spoke with EDP states to continue zosyn but D/C after current dose.

## 2015-03-26 NOTE — H&P (Addendum)
[name] is a 64 y.o. male  Admit Date: 03/26/2015 Referring Physician: [facility] Emergency Department Primary Cardiologist:: Daiva Nakayama, M.D. Chief complaint / reason for admission: Dyspnea  HPI: 64 year old gentleman with a history of ischemic cardiomyopathy, recent pancreatitis, protein calorie malnutrition, posttraumatic stress disorder, coronary artery disease with prior coronary bypass grafting, refusal of AICD therapy, and recent confusion over medical therapy. Recent hypotension requiring removal of heart failure therapy. Last EF 40-45% 02/21/15).  The patient was seen by Dr. Antoine Poche on 03/19/2015. Prior to this office visit the patient was hospitalized 3 times between April 30 and 03/09/2014. The initial admission was secondary to acute on chronic pancreatitis, followed by an admission for altered mental status and, finally an admission for hospital acquired pneumonia and urinary tract infection. During this time the patient had significant hypotension and both lisinopril and carvedilol were discontinued. Between each hospital stay, he would resume his typical heart failure therapy including the Ace inhibitor and beta blocker. Because of this severe hypotension was noted as an outpatient. On the 03/19/2015 visit lisinopril was discontinued. 4 days later carvedilol was discontinued after it was identified that the patient was still taking the medication.  Over the past week since the visit with Dr. Antoine Poche, the patient states that he has been taking increasing quantities of energy drinks. Last evening and again this morning he developed sudden shortness of breath. This morning according to the wife he was in extreme distress, was brought to the emergency room, was hypoxic, required BiPAP, and improved after IV Lasix. At the time of the exam the patient is speaking coherently. Heart rate is decreased into the low 80s, skin is warm and dry, and his neck veins are flat. Associated with  the dyspnea was a right parasternal focal chest discomfort. The discomfort is now resolved.  Both the patient and wife requested we not involve the [facility] (their plan is to switch to another primary care provider).  Considerable time was spent with the patient concerning CODE STATUS, and he is a full code including intubation and CPR with the wife had and the power of attorney to decide if support should be withdrawn.    PMH:    Past Medical History  Diagnosis Date  . Seizure disorder   . Nausea   . History of heartburn   . Chronic back pain   . IHD (ischemic heart disease)   . Cigarette smoker   . Hypercholesterolemia   . CHF (congestive heart failure)     EF had been 20% initially. 2010 EF 35-40%.  . Traumatic brain injury   . Seizures   . COPD (chronic obstructive pulmonary disease)   . Pancreatitis     PSH:    Past Surgical History  Procedure Laterality Date  . Cardiac catheterization  03/12/2007  . Coronary artery bypass graft  2000    x6, graft to the PDA and LAD, patent saphenous vein graft to acute marginal and posterior lateral severely diseased saphenous vein graft to diagonal. Patent LIMA to the LAD.  Marland Kitchen Shoulder surgery      right  . Facial reconstruction surgery    . Back surgery     ALLERGIES:   Aspirin; Flomax; and Morphine and related Prior to Admit Meds:   (Not in a hospital admission) Family HX:    Family History  Problem Relation Age of Onset  . Heart disease Brother   . Hypertension Brother   . Heart attack Brother   .  Heart disease Sister   . Coronary artery disease Brother   . Hypertension Brother   . Heart attack Brother   . Coronary artery disease Sister   . Diabetes Brother   . Thyroid disease Brother   . Heart attack Brother   . Diabetes Father   . Heart disease Father   . Hypertension Father   . Heart attack Father   . Hypertension Mother   . Varicose Veins Mother    Social HX:    History   Social History  .  Marital Status: Married    Spouse Name: N/A  . Number of Children: N/A  . Years of Education: N/A   Occupational History  . Not on file.   Social History Main Topics  . Smoking status: Current Every Day Smoker -- 0.75 packs/day for 50 years    Types: Cigarettes  . Smokeless tobacco: Never Used  . Alcohol Use: No  . Drug Use: No  . Sexual Activity: Not on file   Other Topics Concern  . Not on file   Social History Narrative     ROS: He denies abdominal pain, nausea, vomiting, and blood in the urine or stool. He has had physical therapy is now outpatient and is improving with reference to mobility. Mental acuity has returned to baseline since last discharge from the hospital. Appetite is been poor. Lower extremities have remained edematous. He denies dysphagia. He denies smoking and alcohol use currently although in past the patient was a pack per day smoker for approximately 50 years.  Physical Exam: Blood pressure 85/56, pulse 81, temperature 97.7 F (36.5 C), temperature source Oral, resp. rate 16, height 6' (1.829 m), weight 157 lb 10.1 oz (71.5 kg), SpO2 100 %.    Appearance reveals a frail bearded somewhat pale gentleman who is in no distress on nasal cannula. No cyanosis is noted. HEENT exam reveals no evidence of jaundice. Neck exam reveals no significant JVD. No carotid bruits are heard Chest reveals rales throughout the midportion of the right lung but otherwise are clear Cardiac exam reveals an S4 gallop and a soft apical systolic murmur Abdomen is soft. Bowel sounds are normal. Liver and spleen are not palpable. Extremities reveal 2+ pedal edema right greater than left leg. Pulses are 2+ and symmetric in the radial and posterior tibial bilaterally. Carotid upstroke appears to be somewhat diminished. Neurological exam reveals a patient who is bland and affect. He is essentially expressionless. He does answer questions appropriately but somewhat more slowly than would be  expected. There are no focal deficits noted. Labs: Lab Results  Component Value Date   WBC 17.7* 03/26/2015   HGB 9.6* 03/26/2015   HCT 29.6* 03/26/2015   MCV 104.6* 03/26/2015   PLT 360 03/26/2015     Recent Labs Lab 03/26/15 0229 03/26/15 0439  NA 134*  --   K 4.2  --   CL 97*  --   CO2 23  --   BUN 20  --   CREATININE 1.58*  --   CALCIUM 8.7*  --   PROT  --  5.5*  BILITOT  --  0.3  ALKPHOS  --  59  ALT  --  7*  AST  --  30  GLUCOSE 190*  --    Lab Results  Component Value Date   TROPONINI 1.30* 03/26/2015   LACTIC ACID: 6.5, decreasing to 3.6, decreasing to 2.05 at 7:30 AM  BNP: 3309 up from 600 in May  Radiology:  Dg Chest Port 1 View  03/26/2015   CLINICAL DATA:  Acute onset of shortness of breath. Initial encounter.  EXAM: PORTABLE CHEST - 1 VIEW  COMPARISON:  Chest radiograph performed 03/07/2015  FINDINGS: The lungs are well-aerated. Vascular congestion is noted. Mild bibasilar opacities raise concern for mild interstitial edema. There is no evidence of pleural effusion or pneumothorax.  The cardiomediastinal silhouette is borderline normal in size. The patient is status post median sternotomy, with evidence of prior CABG. No acute osseous abnormalities are seen.  IMPRESSION: Vascular congestion noted. Mild bibasilar opacities raise concern for mild interstitial edema.   Electronically Signed   By: Roanna Raider M.D.   On: 03/26/2015 02:56    EKG:  Sinus tachycardia with incomplete left bundle branch block. Other than sinus tachycardia and no change compared to 03/08/2015  ASSESSMENT:  1. Acute on chronic combined systolic and diastolic heart failure, likely secondary to excessive fluid intake. Rule out ischemic component given the elevated troponin levels. 2. Non-ST elevation myocardial infarction (chest discomfort, known history of coronary disease with prior bypass grafting, and elevated troponin) 3. Hypotension, unexplained at this time. Rule out other  causes such as cardiogenic given the elevated troponin levels. Lactic acidosis was present upon admission. 4. Posttraumatic stress disorder 5. Acute kidney injury related to hypoperfusion 6. Abdominal aortic aneurysm 4.8 cm in diameter 7. Seizure disorder  Plan:  1. IV heparin 2. IV dopamine for renal perfusion, but need to monitor HR response. 3. Will likely require cardiac catheterization early next week, once kidney function is stable, unless recurrent CP. 4. Monitor kidney function 5. Physical therapy to continue with the patient's physical rehabilitation 6. Rule out arrhythmia as a potential cause for the patient's sudden acute decompensation 7. The patient is a full code. He is in critical condition and we have had a full discussion of CODE STATUS which is "FULL". 8. Consider repeat echo 9 May need triad Hospitalist help if other medical issues contaminate care plans.   Lesleigh Noe 03/26/2015 8:58 AM

## 2015-03-26 NOTE — ED Notes (Signed)
MD made aware of patient BP. Verbal for 500 ML of Normal Saline.

## 2015-03-26 NOTE — Progress Notes (Signed)
ANTICOAGULATION CONSULT NOTE - Initial Consult  Pharmacy Consult for heparin Indication: chest pain/ACS  Allergies  Allergen Reactions  . Aspirin     To much aspirin make his bleed on the inside can take a small amount.  . Flomax [Tamsulosin Hcl] Swelling  . Morphine And Related Other (See Comments)    "MAKES HIM MEAN"    Patient Measurements: Height: 6' (182.9 cm) Weight: 157 lb 10.1 oz (71.5 kg) IBW/kg (Calculated) : 77.6  Vital Signs: Temp: 97.7 F (36.5 C) (06/03 0201) Temp Source: Oral (06/03 0201) BP: 73/52 mmHg (06/03 0615) Pulse Rate: 77 (06/03 0615)  Labs:  Recent Labs  03/26/15 0229 03/26/15 0439  HGB 9.6*  --   HCT 29.6*  --   PLT 360  --   CREATININE 1.58*  --   TROPONINI  --  1.30*    Estimated Creatinine Clearance: 47.1 mL/min (by C-G formula based on Cr of 1.58).   Medical History: Past Medical History  Diagnosis Date  . Seizure disorder   . Nausea   . History of heartburn   . Chronic back pain   . IHD (ischemic heart disease)   . Cigarette smoker   . Hypercholesterolemia   . CHF (congestive heart failure)     EF had been 20% initially. 2010 EF 35-40%.  . Traumatic brain injury   . Seizures   . COPD (chronic obstructive pulmonary disease)   . Pancreatitis       Assessment: 64yo male c/o CP radiating to left arm and associated w/ nausea and diaphoresis, pain awoke him from sleep, troponin found to be elevated, to begin heparin.  Goal of Therapy:  Heparin level 0.3-0.7 units/ml Monitor platelets by anticoagulation protocol: Yes   Plan:  Will give heparin 3000 units IV bolus x1 followed by gtt at 850 units/hr and monitor heparin levels and CBC.  Vernard GamblesVeronda Montanna Mcbain, PharmD, BCPS  03/26/2015,6:18 AM

## 2015-03-27 DIAGNOSIS — Z951 Presence of aortocoronary bypass graft: Secondary | ICD-10-CM

## 2015-03-27 LAB — BASIC METABOLIC PANEL
Anion gap: 9 (ref 5–15)
BUN: 16 mg/dL (ref 6–20)
CHLORIDE: 97 mmol/L — AB (ref 101–111)
CO2: 30 mmol/L (ref 22–32)
CREATININE: 1.45 mg/dL — AB (ref 0.61–1.24)
Calcium: 8.4 mg/dL — ABNORMAL LOW (ref 8.9–10.3)
GFR calc Af Amer: 57 mL/min — ABNORMAL LOW (ref >60)
GFR calc non Af Amer: 49 mL/min — ABNORMAL LOW (ref >60)
Glucose, Bld: 90 mg/dL (ref 65–99)
POTASSIUM: 4.1 mmol/L (ref 3.5–5.1)
SODIUM: 136 mmol/L (ref 135–145)

## 2015-03-27 LAB — MRSA PCR SCREENING: MRSA BY PCR: NEGATIVE

## 2015-03-27 LAB — HEMOGLOBIN A1C
HEMOGLOBIN A1C: 5.6 % (ref 4.8–5.6)
Mean Plasma Glucose: 114 mg/dL

## 2015-03-27 LAB — HEPARIN LEVEL (UNFRACTIONATED)
Heparin Unfractionated: <0.1 IU/mL — ABNORMAL LOW (ref 0.30–0.70)
Heparin Unfractionated: 0.41 IU/mL (ref 0.30–0.70)

## 2015-03-27 LAB — CBC
HEMATOCRIT: 26.8 % — AB (ref 39.0–52.0)
Hemoglobin: 8.7 g/dL — ABNORMAL LOW (ref 13.0–17.0)
MCH: 33.3 pg (ref 26.0–34.0)
MCHC: 32.5 g/dL (ref 30.0–36.0)
MCV: 102.7 fL — ABNORMAL HIGH (ref 78.0–100.0)
Platelets: 237 10*3/uL (ref 150–400)
RBC: 2.61 MIL/uL — ABNORMAL LOW (ref 4.22–5.81)
RDW: 15.3 % (ref 11.5–15.5)
WBC: 8.9 10*3/uL (ref 4.0–10.5)

## 2015-03-27 LAB — LIPID PANEL
Cholesterol: 92 mg/dL (ref 0–200)
HDL: 28 mg/dL — ABNORMAL LOW (ref >40)
LDL Cholesterol: 43 mg/dL (ref 0–99)
TRIGLYCERIDES: 105 mg/dL (ref <150)
Total CHOL/HDL Ratio: 3.3 RATIO
VLDL: 21 mg/dL (ref 0–40)

## 2015-03-27 MED ORDER — HEPARIN BOLUS VIA INFUSION
2000.0000 [IU] | Freq: Once | INTRAVENOUS | Status: AC
  Administered 2015-03-27: 2000 [IU] via INTRAVENOUS
  Filled 2015-03-27: qty 2000

## 2015-03-27 NOTE — Progress Notes (Signed)
ANTICOAGULATION CONSULT NOTE - Follow Up Consult  Pharmacy Consult for heparin Indication: chest pain/ACS  Allergies  Allergen Reactions  . Aspirin     To much aspirin make his bleed on the inside can take a small amount.  . Flomax [Tamsulosin Hcl] Swelling  . Morphine And Related Other (See Comments)    "MAKES HIM MEAN"    Patient Measurements: Height: 6' (182.9 cm) Weight: 156 lb 8.4 oz (71 kg) IBW/kg (Calculated) : 77.6 Heparin Dosing Weight:   Vital Signs: Temp: 98.3 F (36.8 C) (06/04 1900) Temp Source: Oral (06/04 1900) BP: 107/63 mmHg (06/04 1900) Pulse Rate: 84 (06/04 1900)  Labs:  Recent Labs  03/26/15 0229  03/26/15 0800 03/26/15 1810  03/26/15 2142 03/27/15 0442 03/27/15 1125 03/27/15 1925  HGB 9.6*  --   --   --   --   --  8.7*  --   --   HCT 29.6*  --   --   --   --   --  26.8*  --   --   PLT 360  --   --   --   --   --  237  --   --   HEPARINUNFRC  --   --   --   --   < > <0.10* <0.10* <0.10* 0.41  CREATININE 1.58*  --   --   --   --   --  1.45*  --   --   TROPONINI  --   < > 3.64* 7.36*  --  5.08*  --   --   --   < > = values in this interval not displayed.  Estimated Creatinine Clearance: 51 mL/min (by C-G formula based on Cr of 1.45).   Medications:  Scheduled:  . antiseptic oral rinse  7 mL Mouth Rinse BID  . aspirin EC  81 mg Oral Daily  . divalproex  500 mg Oral TID  . furosemide  40 mg Oral Daily  . nitroGLYCERIN  0.5 inch Topical 4 times per day  . sodium chloride  3 mL Intravenous Q12H   Infusions:  . DOPamine 4 mcg/kg/min (03/27/15 0700)  . heparin 1,350 Units/hr (03/27/15 1432)    Assessment: 64 yo male with ACS is currently on therapeutic heparin.  Heparin level is 0.41.  Goal of Therapy:  Heparin level 0.3-0.7 units/ml Monitor platelets by anticoagulation protocol: Yes   Plan:  - continue heparin @ 1350 units/hr - repeat heparin level in 6 hr to confirm  Shamon Lobo, Tsz-Yin 03/27/2015,8:04 PM

## 2015-03-27 NOTE — Progress Notes (Signed)
Cards: Dr. Antoine Poche  Subjective:   64 year old gentleman with a history of ischemic cardiomyopathy, recent pancreatitis, protein calorie malnutrition, posttraumatic stress disorder, coronary artery disease with prior coronary bypass grafting, refusal of AICD therapy, and recent confusion over medical therapy. Recent hypotension requiring removal of heart failure therapy. Last EF 40-45% 02/21/15).  Feeling better. Family in room. Mild CP recently. NTG patch  Objective:  Vital Signs in the last 24 hours: Temp:  [97.6 F (36.4 C)-98.2 F (36.8 C)] 98 F (36.7 C) (06/04 0730) Pulse Rate:  [73-113] 77 (06/04 0730) Resp:  [14-26] 17 (06/04 0730) BP: (89-116)/(57-83) 102/59 mmHg (06/04 0730) SpO2:  [91 %-100 %] 100 % (06/04 0730) FiO2 (%):  [28 %] 28 % (06/03 1513) Weight:  [156 lb 8.4 oz (71 kg)-156 lb 12 oz (71.1 kg)] 156 lb 8.4 oz (71 kg) (06/04 0300)  Intake/Output from previous day: 06/03 0701 - 06/04 0700 In: 217.2 [I.V.:217.2] Out: 500 [Urine:500]   Physical Exam: General: Frail, in no acute distress. Head:  Normocephalic and atraumatic. Lungs: mild rales mid right lung. Heart: Normal S1 and S2.  Soft S murmur, no rubs or gallops.  Abdomen: soft, non-tender, positive bowel sounds. Extremities: No clubbing or cyanosis. No edema. Neurologic: Alert and oriented x 3. Soft spoken, no expressions    Lab Results:  Recent Labs  03/26/15 0229 03/27/15 0442  WBC 17.7* 8.9  HGB 9.6* 8.7*  PLT 360 237    Recent Labs  03/26/15 0229 03/27/15 0442  NA 134* 136  K 4.2 4.1  CL 97* 97*  CO2 23 30  GLUCOSE 190* 90  BUN 20 16  CREATININE 1.58* 1.45*    Recent Labs  03/26/15 1810 03/26/15 2142  TROPONINI 7.36* 5.08*   Hepatic Function Panel  Recent Labs  03/26/15 0439  PROT 5.5*  ALBUMIN 1.8*  AST 30  ALT 7*  ALKPHOS 59  BILITOT 0.3  BILIDIR 0.1  IBILI 0.2*    Recent Labs  03/27/15 0442  CHOL 92   No results for input(s): PROTIME in the last 72  hours.  Imaging: Dg Chest Port 1 View  03/26/2015   CLINICAL DATA:  Acute onset of shortness of breath. Initial encounter.  EXAM: PORTABLE CHEST - 1 VIEW  COMPARISON:  Chest radiograph performed 03/07/2015  FINDINGS: The lungs are well-aerated. Vascular congestion is noted. Mild bibasilar opacities raise concern for mild interstitial edema. There is no evidence of pleural effusion or pneumothorax.  The cardiomediastinal silhouette is borderline normal in size. The patient is status post median sternotomy, with evidence of prior CABG. No acute osseous abnormalities are seen.  IMPRESSION: Vascular congestion noted. Mild bibasilar opacities raise concern for mild interstitial edema.   Electronically Signed   By: Roanna Raider M.D.   On: 03/26/2015 02:56   Personally viewed.   BNP 3300 from 600 in May Lactic acid decreased   Telemetry: No adverse rhythms Personally viewed.   EKG: Sinus tachycardia with incomplete left bundle branch block. Other than sinus tachycardia and no change compared to 03/08/2015  Scheduled Meds: . antiseptic oral rinse  7 mL Mouth Rinse BID  . aspirin EC  81 mg Oral Daily  . divalproex  500 mg Oral TID  . furosemide  40 mg Oral Daily  . nitroGLYCERIN  0.5 inch Topical 4 times per day  . sodium chloride  3 mL Intravenous Q12H   Continuous Infusions: . DOPamine 4 mcg/kg/min (03/26/15 2100)  . heparin 1,100 Units/hr (03/27/15 0618)   PRN  Meds:.sodium chloride, acetaminophen, nitroGLYCERIN, ondansetron (ZOFRAN) IV, sodium chloride   Assessment/Plan:  Principal Problem:   Acute on chronic combined systolic and diastolic congestive heart failure, NYHA class 3 Active Problems:   S/P CABG (coronary artery bypass graft)   NSTEMI (non-ST elevated myocardial infarction)   AKI (acute kidney injury)   Acute respiratory failure with hypoxia   Hypotension   Acute on chronic combined systolic and diastolic congestive heart failure, NYHA class 4   ASSESSMENT:  1. Acute  on chronic combined systolic and diastolic heart failure, likely secondary to excessive fluid intake. Rule out ischemic component given the elevated troponin levels. 2. Non-ST elevation myocardial infarction (chest discomfort, known history of coronary disease with prior bypass grafting, and elevated troponin-7) 3. Hypotension, unexplained at this time. Rule out other causes such as cardiogenic given the elevated troponin levels. Lactic acidosis was present upon admission, improved 4. Posttraumatic stress disorder 5. Acute kidney injury related to hypoperfusion 6. Abdominal aortic aneurysm 4.8 cm in diameter 7. Seizure disorder 8. Anemia  Plan:  1. IV heparin 2. IV dopamine for renal perfusion, but need to monitor HR response. Has had creat of 1.4 to 1.2 in the past.  3. Will likely require cardiac catheterization early next week-possibly Monday, once kidney function is stable, unless recurrent CP. Had mild CP earlier today. 4. Monitor kidney function 5. Physical therapy to continue with the patient's physical rehabilitation 6. Rule out arrhythmia as a potential cause for the patient's sudden acute decompensation. Nothing now on tele.  7. The patient is a full code. He is in critical condition and we have had a full discussion of CODE STATUS which is "FULL". 8. Consider repeat echo - EF 45% on 02/21/15 9 May need triad Hospitalist help if other medical issues arise  SKAINS, MARK 03/27/2015, 10:42 AM

## 2015-03-27 NOTE — Progress Notes (Signed)
ANTICOAGULATION CONSULT NOTE - Follow Up Consult  Pharmacy Consult for heparin Indication: NSTEMI   Labs:  Recent Labs  03/26/15 0229  03/26/15 0800 03/26/15 1810 03/26/15 2142 03/27/15 0442  HGB 9.6*  --   --   --   --  8.7*  HCT 29.6*  --   --   --   --  26.8*  PLT 360  --   --   --   --  237  HEPARINUNFRC  --   --   --   --  <0.10* <0.10*  CREATININE 1.58*  --   --   --   --   --   TROPONINI  --   < > 3.64* 7.36* 5.08*  --   < > = values in this interval not displayed.   Assessment: 63yo undetectable on heparin with initial dosing for NSTEMI.  Goal of Therapy:  Heparin level 0.3-0.7 units/ml   Plan:  Will rebolus with heparin 2000 units and increase gtt by 4 units/kg/hr to 1100 units/hr and check level in 8hr.  [name], PharmD, BCPS  03/27/2015,5:49 AM

## 2015-03-27 NOTE — Progress Notes (Addendum)
ANTICOAGULATION CONSULT NOTE - Follow Up Consult  Pharmacy Consult for heparin Indication: chest pain/ACS  Allergies  Allergen Reactions  . Aspirin     To much aspirin make his bleed on the inside can take a small amount.  . Flomax [Tamsulosin Hcl] Swelling  . Morphine And Related Other (See Comments)    "MAKES HIM MEAN"    Patient Measurements: Height: 6' (182.9 cm) Weight: 156 lb 8.4 oz (71 kg) IBW/kg (Calculated) : 77.6  Vital Signs: Temp: 97.9 F (36.6 C) (06/04 1200) Temp Source: Oral (06/04 1200) BP: 99/62 mmHg (06/04 1200) Pulse Rate: 79 (06/04 1200)  Labs:  Recent Labs  03/26/15 0229  03/26/15 0800 03/26/15 1810 03/26/15 2142 03/27/15 0442 03/27/15 1125  HGB 9.6*  --   --   --   --  8.7*  --   HCT 29.6*  --   --   --   --  26.8*  --   PLT 360  --   --   --   --  237  --   HEPARINUNFRC  --   --   --   --  <0.10* <0.10* <0.10*  CREATININE 1.58*  --   --   --   --  1.45*  --   TROPONINI  --   < > 3.64* 7.36* 5.08*  --   --   < > = values in this interval not displayed.  Estimated Creatinine Clearance: 51 mL/min (by C-G formula based on Cr of 1.45).   Medications:  Heparin infusion 1100 units/hr  Assessment: 64 yo m admitted for CP. Pharmacy is consulted to dose heparin. HL this AM is still undetectable on 1100 units/hr. CBC stable, no issues per RN.  Goal of Therapy:  Heparin level 0.3-0.7 units/ml Monitor platelets by anticoagulation protocol: Yes   Plan:  Heparin bolus 2,000 units x 1 Increase heparin infusion to 1350 units/hr 6-hr HL @ 1930 Daily HL Monitor hgb/plts, s/s of bleeding, clinical course  Benjamin Casanas L. Roseanne RenoStewart, PharmD Clinical Pharmacy Resident Pager: [phone] 03/27/2015 1:19 PM

## 2015-03-28 DIAGNOSIS — J810 Acute pulmonary edema: Secondary | ICD-10-CM

## 2015-03-28 DIAGNOSIS — I959 Hypotension, unspecified: Secondary | ICD-10-CM

## 2015-03-28 LAB — CBC
HEMATOCRIT: 26.4 % — AB (ref 39.0–52.0)
Hemoglobin: 8.7 g/dL — ABNORMAL LOW (ref 13.0–17.0)
MCH: 33.3 pg (ref 26.0–34.0)
MCHC: 33 g/dL (ref 30.0–36.0)
MCV: 101.1 fL — AB (ref 78.0–100.0)
Platelets: 223 10*3/uL (ref 150–400)
RBC: 2.61 MIL/uL — ABNORMAL LOW (ref 4.22–5.81)
RDW: 14.9 % (ref 11.5–15.5)
WBC: 7.8 10*3/uL (ref 4.0–10.5)

## 2015-03-28 LAB — HEPARIN LEVEL (UNFRACTIONATED)
HEPARIN UNFRACTIONATED: 0.3 [IU]/mL (ref 0.30–0.70)
HEPARIN UNFRACTIONATED: 0.52 [IU]/mL (ref 0.30–0.70)

## 2015-03-28 LAB — BASIC METABOLIC PANEL
ANION GAP: 9 (ref 5–15)
BUN: 15 mg/dL (ref 6–20)
CHLORIDE: 97 mmol/L — AB (ref 101–111)
CO2: 31 mmol/L (ref 22–32)
CREATININE: 1.32 mg/dL — AB (ref 0.61–1.24)
Calcium: 8.2 mg/dL — ABNORMAL LOW (ref 8.9–10.3)
GFR calc Af Amer: >60 mL/min (ref >60)
GFR calc non Af Amer: 55 mL/min — ABNORMAL LOW (ref >60)
Glucose, Bld: 99 mg/dL (ref 65–99)
POTASSIUM: 3.7 mmol/L (ref 3.5–5.1)
SODIUM: 137 mmol/L (ref 135–145)

## 2015-03-28 MED ORDER — SODIUM CHLORIDE 0.9 % IJ SOLN: 3.0000 mL | INTRAMUSCULAR | Status: DC | PRN

## 2015-03-28 MED ORDER — SODIUM CHLORIDE 0.9 % IJ SOLN
3.0000 mL | Freq: Two times a day (BID) | INTRAMUSCULAR | Status: DC
  Administered 2015-03-28 – 2015-03-29 (×2): 3 mL via INTRAVENOUS

## 2015-03-28 MED ORDER — ASPIRIN 81 MG PO CHEW
81.0000 mg | CHEWABLE_TABLET | ORAL | Status: AC
  Administered 2015-03-29: 81 mg via ORAL
  Filled 2015-03-28: qty 1

## 2015-03-28 MED ORDER — SODIUM CHLORIDE 0.9 % IV SOLN
INTRAVENOUS | Status: DC
  Administered 2015-03-29: 06:00:00 via INTRAVENOUS

## 2015-03-28 MED ORDER — SODIUM CHLORIDE 0.9 % IV SOLN: 250.0000 mL | INTRAVENOUS | Status: DC | PRN

## 2015-03-28 NOTE — Progress Notes (Signed)
ANTICOAGULATION CONSULT NOTE - Follow Up Consult  Pharmacy Consult for heparin Indication: chest pain/ACS  Allergies  Allergen Reactions  . Aspirin     To much aspirin make his bleed on the inside can take a small amount.  . Flomax [Tamsulosin Hcl] Swelling  . Morphine And Related Other (See Comments)    "MAKES HIM MEAN"    Patient Measurements: Height: 6' (182.9 cm) Weight: 155 lb 13.9 oz (70.702 kg) IBW/kg (Calculated) : 77.6  Vital Signs: Temp: 98.1 F (36.7 C) (06/05 1203) Temp Source: Oral (06/05 1203) BP: 116/78 mmHg (06/05 1203) Pulse Rate: 75 (06/05 1203)  Labs:  Recent Labs  03/26/15 0229  03/26/15 0800 03/26/15 1810  03/26/15 2142 03/27/15 0442 03/27/15 1125 03/27/15 1925 03/28/15 0146 03/28/15 0305  HGB 9.6*  --   --   --   --   --  8.7*  --   --   --  8.7*  HCT 29.6*  --   --   --   --   --  26.8*  --   --   --  26.4*  PLT 360  --   --   --   --   --  237  --   --   --  223  HEPARINUNFRC  --   --   --   --   < > <0.10* <0.10* <0.10* 0.41 0.30  --   CREATININE 1.58*  --   --   --   --   --  1.45*  --   --   --  1.32*  TROPONINI  --   < > 3.64* 7.36*  --  5.08*  --   --   --   --   --   < > = values in this interval not displayed.  Estimated Creatinine Clearance: 55.8 mL/min (by C-G formula based on Cr of 1.32).   Medications:  Heparin infusion 1350 units/hr  Assessment: 64 yo m admitted for CP. Pharmacy is consulted to dose heparin. HL this AM was therapeutic after rate increase at 0.30 on 1350 units/hr. A repeat HL was obtained this afternoon and found to be 0.52. CBC stable.  Goal of Therapy:  Heparin level 0.3-0.7 units/ml Monitor platelets by anticoagulation protocol: Yes   Plan:  Continue heparin infusion at 1350 units/hr F/u HL in AM Daily HL/CBC Monitor cbc, s/s of bleeding, clinical course  Cassie L. Roseanne RenoStewart, PharmD Clinical Pharmacy Resident Pager: [phone] 03/28/2015 3:04 PM

## 2015-03-28 NOTE — Progress Notes (Addendum)
Cards: Dr. Antoine Poche  Subjective:   64 year old gentleman with a history of ischemic cardiomyopathy, CABG, recent pancreatitis, protein calorie malnutrition, posttraumatic stress disorder, coronary artery disease with prior coronary bypass grafting, refusal of AICD therapy, and recent confusion over medical therapy. Recent hypotension requiring removal of heart failure therapy. Last EF 40-45% 02/21/15.  Feeling better. Brother in room. No CP recently. NTG patch. Feels cold.  Objective:  Vital Signs in the last 24 hours: Temp:  [97.9 F (36.6 C)-98.9 F (37.2 C)] 98.9 F (37.2 C) (06/05 0802) Pulse Rate:  [72-107] 72 (06/05 0800) Resp:  [0-24] 16 (06/05 0800) BP: (92-120)/(60-77) 103/63 mmHg (06/05 0800) SpO2:  [98 %-100 %] 100 % (06/05 0800) Weight:  [155 lb 13.9 oz (70.702 kg)] 155 lb 13.9 oz (70.702 kg) (06/05 0300)  Intake/Output from previous day: 06/04 0701 - 06/05 0700 In: 1116 [P.O.:700; I.V.:416] Out: 775 [Urine:775]   Physical Exam: General: Frail, in no acute distress. Head:  Normocephalic and atraumatic. Lungs: mild rales mid right lung. Heart: Normal S1 and S2.  Soft S murmur, no rubs or gallops.  Abdomen: soft, non-tender, positive bowel sounds. Extremities: No clubbing or cyanosis. No edema. Neurologic: Alert and oriented x 3. Soft spoken, no expressions    Lab Results:  Recent Labs  03/27/15 0442 03/28/15 0305  WBC 8.9 7.8  HGB 8.7* 8.7*  PLT 237 223    Recent Labs  03/27/15 0442 03/28/15 0305  NA 136 137  K 4.1 3.7  CL 97* 97*  CO2 30 31  GLUCOSE 90 99  BUN 16 15  CREATININE 1.45* 1.32*    Recent Labs  03/26/15 1810 03/26/15 2142  TROPONINI 7.36* 5.08*   Hepatic Function Panel  Recent Labs  03/26/15 0439  PROT 5.5*  ALBUMIN 1.8*  AST 30  ALT 7*  ALKPHOS 59  BILITOT 0.3  BILIDIR 0.1  IBILI 0.2*    Recent Labs  03/27/15 0442  CHOL 92   BNP 3300 from 600 in May Lactic acid decreased   Telemetry: No adverse rhythms  Personally viewed.   EKG: Sinus tachycardia with incomplete left bundle branch block. Other than sinus tachycardia and no change compared to 03/08/2015  Scheduled Meds: . antiseptic oral rinse  7 mL Mouth Rinse BID  . aspirin EC  81 mg Oral Daily  . divalproex  500 mg Oral TID  . furosemide  40 mg Oral Daily  . nitroGLYCERIN  0.5 inch Topical 4 times per day  . sodium chloride  3 mL Intravenous Q12H   Continuous Infusions: . DOPamine 4 mcg/kg/min (03/28/15 0700)  . heparin 1,350 Units/hr (03/28/15 0732)   PRN Meds:.sodium chloride, acetaminophen, nitroGLYCERIN, ondansetron (ZOFRAN) IV, sodium chloride   Assessment/Plan:  Principal Problem:   Acute on chronic combined systolic and diastolic congestive heart failure, NYHA class 3 Active Problems:   S/P CABG (coronary artery bypass graft)   NSTEMI (non-ST elevated myocardial infarction)   AKI (acute kidney injury)   Acute respiratory failure with hypoxia   Hypotension   Acute on chronic combined systolic and diastolic congestive heart failure, NYHA class 4   ASSESSMENT:  1. Acute on chronic combined systolic and diastolic heart failure, likely secondary to excessive fluid intake. Rule out ischemic component given the elevated troponin levels. 2. Non-ST elevation myocardial infarction (chest discomfort, known history of coronary disease with prior bypass grafting, and elevated troponin-7) 3. Hypotension, unexplained at this time. Rule out other causes such as cardiogenic given the elevated troponin levels. Lactic acidosis was  present upon admission, improved. Off of BP meds.  4. Posttraumatic stress disorder 5. Acute kidney injury related to hypoperfusion - improved 6. Abdominal aortic aneurysm 4.8 cm in diameter 7. Seizure disorder 8. Anemia  Plan:  1. IV heparin - Hg stable 8.7 2. IV dopamine for renal perfusion, but need to monitor HR response (had sinus tachycardia at one point). Has had creat of 1.4 to 1.2 in the past.  Creat has improved to 1.32. I will keep low dose dopamine on until cath. Wean after that.  3. Cardiac catheterization Monday, kidney function is stable, unless recurrent CP. Had mild CP earlier today. 4. Monitor kidney function 5. Physical therapy to continue with the patient's physical rehabilitation 6. Rule out arrhythmia as a potential cause for the patient's sudden acute decompensation. Nothing now on tele. Had sinus tachycardia earlier. ? If patient became suddenly ischemic 7. The patient is a full code. He is in critical condition and we have had a full discussion of CODE STATUS which is "FULL". 8. Consider repeat echo - EF 45% on 02/21/15 9. May need triad Hospitalist help if other medical issues arise  Had discussion and he wishes to proceed with LHC. I am aware of prior hospitalizations and even visit from palliative care in May. If lesion that is amenable to PCI is found and subsequently helps with possible flash edema from ischemia, his quality of life may improve.    SKAINS, MARK 03/28/2015, 11:28 AM

## 2015-03-28 NOTE — Progress Notes (Signed)
Patient lying supine position in  Bed with warming blankets on him. Patient turned several times with linen change of bed. Patient called RN to state he felt SOB. HR sustaining 120's ST with patient lying still. Watched patient closely for dysrrhythmas and none noted. VS stable O2 at 2LN/C. After 5 minutes HR sustaining in 80's. See strips. Monitoring closely.

## 2015-03-28 NOTE — Evaluation (Signed)
Physical Therapy Evaluation Patient Details Name: [name] MRN: [id number] DOB: 12/24/1950 Today's Date: 03/28/2015   History of Present Illness  Patient is a 64 yo male admitted 03/26/15 with SOB and tachycardia.  Patient with CHF exacerbation and NSTEMI.  Patient for cardiac cath this week.  PMH:  ICM, EF 40-45%, CAD, CABG, CHF, COPD, PTSD, TBI, seizures, back pain  Clinical Impression  Patient presents with problems listed below.  Will benefit from acute PT to maximize functional mobility prior to discharge home with wife.  Recommend HHPT resume at d/c.    Follow Up Recommendations Home health PT;Supervision/Assistance - 24 hour    Equipment Recommendations  Other (comment) (May benefit from tub seat/bench)    Recommendations for Other Services OT consult     Precautions / Restrictions Precautions Precautions: Fall Restrictions Weight Bearing Restrictions: No      Mobility  Bed Mobility Overal bed mobility: Needs Assistance Bed Mobility: Supine to Sit     Supine to sit: Min assist;HOB elevated     General bed mobility comments: BP 87/55 in supine.  Verbal cues for technique.  Assist to scoot hips toward EOB and to steady trunk as patient moved to sitting.  Once upright, patient with fair sitting balance, with very kyphotic trunk/posture.  After sitting for 4 minutes, BP 97/63.  Transfers Overall transfer level: Needs assistance Equipment used: 2 person hand held assist Transfers: Sit to/from [employer] Sit to Stand: Min assist;+2 safety/equipment Stand pivot transfers: Min assist;+2 safety/equipment       General transfer comment: Verbal cues for hand placement.  Assist to rise to standing and for balance.  Patient able to take several shuffle steps to pivot to chair.   Once in chair, BP 102/61.  HR increased from 85 to 94 bpm with activity.  Ambulation/Gait             General Gait Details: NT  Stairs            Wheelchair  Mobility    Modified Rankin (Stroke Patients Only)       Balance Overall balance assessment: Needs assistance Sitting-balance support: No upper extremity supported;Feet supported Sitting balance-Leahy Scale: Good Sitting balance - Comments: Kyphotic posture   Standing balance support: Bilateral upper extremity supported Standing balance-Leahy Scale: Poor                               Pertinent Vitals/Pain Pain Assessment: 0-10 Pain Score: 4  Pain Location: Back pain ("from laying in bed") Pain Descriptors / Indicators: Aching;Sore Pain Intervention(s): Monitored during session;Repositioned    Home Living Family/patient expects to be discharged to:: Private residence Living Arrangements: Spouse/significant other Available Help at Discharge: Family;Available 24 hours/day Type of Home: House Home Access: Stairs to enter   [employer] of Steps: 1 Home Layout: One level Home Equipment: Walker - 2 wheels;Cane - single point;Wheelchair - manual;Bedside commode      Prior Function Level of Independence: Independent with assistive device(s);Needs assistance   Gait / Transfers Assistance Needed: Patient uses RW for short distances.  Uses w/c for longer distances outside of house.  ADL's / Homemaking Assistance Needed: Assist with bathing/dressing.  Patient reports brother-in-law has been helping him into and out of bath tub.  Wife assists with bath.  Wife provides meal prep and housekeeping.        Hand Dominance        Extremity/Trunk Assessment  Upper Extremity Assessment: Generalized weakness           Lower Extremity Assessment: Generalized weakness (Noted edema BLE's/feet)      Cervical / Trunk Assessment: Kyphotic (Noted reddness in thoracic area)  Communication   Communication: No difficulties  Cognition Arousal/Alertness: Awake/alert Behavior During Therapy: WFL for tasks assessed/performed (Joked with therapy staff during  session) Overall Cognitive Status: History of cognitive impairments - at baseline (No family present)                      General Comments General comments (skin integrity, edema, etc.): Noted reddened areas on back.  RN notified.  Asked that we apply protective dressing over red sites.    Exercises        Assessment/Plan    PT Assessment Patient needs continued PT services  PT Diagnosis Difficulty walking;Generalized weakness;Acute pain   PT Problem List Decreased strength;Decreased activity tolerance;Decreased mobility;Decreased balance;Decreased knowledge of use of DME;Cardiopulmonary status limiting activity;Pain  PT Treatment Interventions DME instruction;Gait training;Functional mobility training;Therapeutic activities;Therapeutic exercise;Balance training;Patient/family education   PT Goals (Current goals can be found in the Care Plan section) Acute Rehab PT Goals Patient Stated Goal: to go home PT Goal Formulation: With patient Time For Goal Achievement: 04/04/15 Potential to Achieve Goals: Good    Frequency Min 3X/week   Barriers to discharge        Co-evaluation               End of Session Equipment Utilized During Treatment: Gait belt;Oxygen Activity Tolerance: Patient limited by fatigue Patient left: in chair;with call bell/phone within reach;with nursing/sitter in room Nurse Communication: Mobility status (Reddened areas on back)         Time: 0272-5366 PT Time Calculation (min) (ACUTE ONLY): 25 min   Charges:   PT Evaluation $Initial PT Evaluation Tier I: 1 Procedure PT Treatments $Therapeutic Activity: 8-22 mins   PT G Codes:        [name] 04-08-15, 2:18 PM [name]. [name], [facility] Acute Rehab Services Pager [phone]

## 2015-03-29 ENCOUNTER — Encounter (HOSPITAL_COMMUNITY)
Admission: EM | Disposition: A | Discharge status: Nursing Home (Includes ICF) | Payer: Medicare Other | Source: Home / Self Care | Attending: Cardiology

## 2015-03-29 DIAGNOSIS — R0602 Shortness of breath: Secondary | ICD-10-CM | POA: Insufficient documentation

## 2015-03-29 DIAGNOSIS — R0603 Acute respiratory distress: Secondary | ICD-10-CM | POA: Insufficient documentation

## 2015-03-29 DIAGNOSIS — R778 Other specified abnormalities of plasma proteins: Secondary | ICD-10-CM | POA: Insufficient documentation

## 2015-03-29 DIAGNOSIS — R7989 Other specified abnormal findings of blood chemistry: Secondary | ICD-10-CM

## 2015-03-29 DIAGNOSIS — I2510 Atherosclerotic heart disease of native coronary artery without angina pectoris: Secondary | ICD-10-CM

## 2015-03-29 HISTORY — PX: CARDIAC CATHETERIZATION: SHX172

## 2015-03-29 LAB — CBC
HEMATOCRIT: 27.3 % — AB (ref 39.0–52.0)
HEMOGLOBIN: 9 g/dL — AB (ref 13.0–17.0)
MCH: 33.7 pg (ref 26.0–34.0)
MCHC: 33 g/dL (ref 30.0–36.0)
MCV: 102.2 fL — ABNORMAL HIGH (ref 78.0–100.0)
Platelets: 224 10*3/uL (ref 150–400)
RBC: 2.67 MIL/uL — ABNORMAL LOW (ref 4.22–5.81)
RDW: 15.1 % (ref 11.5–15.5)
WBC: 9.4 10*3/uL (ref 4.0–10.5)

## 2015-03-29 LAB — HEPARIN LEVEL (UNFRACTIONATED): Heparin Unfractionated: 0.44 IU/mL (ref 0.30–0.70)

## 2015-03-29 LAB — BASIC METABOLIC PANEL
ANION GAP: 11 (ref 5–15)
BUN: 14 mg/dL (ref 6–20)
CO2: 31 mmol/L (ref 22–32)
CREATININE: 1.24 mg/dL (ref 0.61–1.24)
Calcium: 8.3 mg/dL — ABNORMAL LOW (ref 8.9–10.3)
Chloride: 96 mmol/L — ABNORMAL LOW (ref 101–111)
GFR calc Af Amer: >60 mL/min (ref >60)
GFR, EST NON AFRICAN AMERICAN: 59 mL/min — AB (ref >60)
GLUCOSE: 78 mg/dL (ref 65–99)
POTASSIUM: 3.6 mmol/L (ref 3.5–5.1)
Sodium: 138 mmol/L (ref 135–145)

## 2015-03-29 LAB — PROTIME-INR
INR: 1.15 (ref 0.00–1.49)
PROTHROMBIN TIME: 14.8 s (ref 11.6–15.2)

## 2015-03-29 SURGERY — LEFT HEART CATH AND CORS/GRAFTS ANGIOGRAPHY

## 2015-03-29 MED ORDER — NITROGLYCERIN 1 MG/10 ML FOR IR/CATH LAB
INTRA_ARTERIAL | Status: AC
  Filled 2015-03-29: qty 10

## 2015-03-29 MED ORDER — HEPARIN (PORCINE) IN NACL 2-0.9 UNIT/ML-% IJ SOLN
INTRAMUSCULAR | Status: AC
  Filled 2015-03-29: qty 1000

## 2015-03-29 MED ORDER — SODIUM CHLORIDE 0.9 % IJ SOLN: 3.0000 mL | INTRAMUSCULAR | Status: DC | PRN

## 2015-03-29 MED ORDER — HEPARIN SODIUM (PORCINE) 1000 UNIT/ML IJ SOLN
INTRAMUSCULAR | Status: DC | PRN
  Administered 2015-03-29: 3500 [IU] via INTRAVENOUS

## 2015-03-29 MED ORDER — MIDAZOLAM HCL 2 MG/2ML IJ SOLN
INTRAMUSCULAR | Status: DC | PRN
  Administered 2015-03-29: 0.5 mg via INTRAVENOUS

## 2015-03-29 MED ORDER — SODIUM CHLORIDE 0.9 % IV BOLUS (SEPSIS)
INTRAVENOUS | Status: DC | PRN
  Administered 2015-03-29: 250 mL via INTRAVENOUS

## 2015-03-29 MED ORDER — HEPARIN SODIUM (PORCINE) 5000 UNIT/ML IJ SOLN
5000.0000 [IU] | Freq: Three times a day (TID) | INTRAMUSCULAR | Status: DC
Start: 2015-03-29 — End: 2015-04-09
  Administered 2015-03-29 – 2015-04-09 (×33): 5000 [IU] via SUBCUTANEOUS
  Filled 2015-03-29 (×37): qty 1

## 2015-03-29 MED ORDER — LIDOCAINE HCL (PF) 1 % IJ SOLN
INTRAMUSCULAR | Status: DC | PRN
  Administered 2015-03-29: 5 mL via SUBCUTANEOUS

## 2015-03-29 MED ORDER — SODIUM CHLORIDE 0.9 % IJ SOLN
3.0000 mL | Freq: Two times a day (BID) | INTRAMUSCULAR | Status: DC
  Administered 2015-03-30 – 2015-04-03 (×10): 3 mL via INTRAVENOUS
  Administered 2015-04-04: 6 mL via INTRAVENOUS
  Administered 2015-04-04 – 2015-04-06 (×4): 3 mL via INTRAVENOUS

## 2015-03-29 MED ORDER — VERAPAMIL HCL 2.5 MG/ML IV SOLN
INTRAVENOUS | Status: DC | PRN
  Administered 2015-03-29: 19:00:00 via INTRA_ARTERIAL

## 2015-03-29 MED ORDER — VERAPAMIL HCL 2.5 MG/ML IV SOLN
INTRAVENOUS | Status: AC
  Filled 2015-03-29: qty 2

## 2015-03-29 MED ORDER — FENTANYL CITRATE (PF) 100 MCG/2ML IJ SOLN
INTRAMUSCULAR | Status: AC
  Filled 2015-03-29: qty 2

## 2015-03-29 MED ORDER — FENTANYL CITRATE (PF) 100 MCG/2ML IJ SOLN
INTRAMUSCULAR | Status: DC | PRN
  Administered 2015-03-29: 12.5 ug via INTRAVENOUS

## 2015-03-29 MED ORDER — IOHEXOL 350 MG/ML SOLN
INTRAVENOUS | Status: DC | PRN
  Administered 2015-03-29: 125 mL via INTRACARDIAC

## 2015-03-29 MED ORDER — MIDAZOLAM HCL 2 MG/2ML IJ SOLN
INTRAMUSCULAR | Status: AC
  Filled 2015-03-29: qty 2

## 2015-03-29 MED ORDER — SODIUM CHLORIDE 0.9 % IV SOLN: 250.0000 mL | INTRAVENOUS | Status: DC | PRN

## 2015-03-29 MED ORDER — SODIUM CHLORIDE 0.9 % WEIGHT BASED INFUSION
1.0000 mL/kg/h | INTRAVENOUS | Status: AC
  Administered 2015-03-29: 1 mL/kg/h via INTRAVENOUS

## 2015-03-29 MED ORDER — HEPARIN SODIUM (PORCINE) 1000 UNIT/ML IJ SOLN
INTRAMUSCULAR | Status: AC
  Filled 2015-03-29: qty 1

## 2015-03-29 SURGICAL SUPPLY — 13 items
CATH INFINITI 5FR ANG PIGTAIL (CATHETERS) ×1 IMPLANT
CATH INFINITI 5FR MULTPACK ANG (CATHETERS) ×2 IMPLANT
CATH OPTITORQUE TIG 4.0 5F (CATHETERS) ×1 IMPLANT
DEVICE RAD COMP TR BAND LRG (VASCULAR PRODUCTS) ×3 IMPLANT
GLIDESHEATH SLEND A-KIT 6F 22G (SHEATH) ×3 IMPLANT
KIT HEART LEFT (KITS) ×3 IMPLANT
PACK CARDIAC CATHETERIZATION (CUSTOM PROCEDURE TRAY) ×3 IMPLANT
SHEATH PINNACLE 5F 10CM (SHEATH) IMPLANT
SYR MEDRAD MARK V 150ML (SYRINGE) ×3 IMPLANT
TRANSDUCER W/STOPCOCK (MISCELLANEOUS) ×3 IMPLANT
TUBING CIL FLEX 10 FLL-RA (TUBING) ×3 IMPLANT
WIRE EMERALD 3MM-J .035X150CM (WIRE) IMPLANT
WIRE SAFE-T 1.5MM-J .035X260CM (WIRE) ×3 IMPLANT

## 2015-03-29 NOTE — H&P (View-Only) (Signed)
Patient ID: [name], male   DOB: [birth year], 64 y.o.   MRN: [id number]  Cards: Dr. Antoine PocheHochrein  Subjective:   64 year old gentleman with a history of ischemic cardiomyopathy, CABG, recent pancreatitis, protein calorie malnutrition, posttraumatic stress disorder, coronary artery disease with prior coronary bypass grafting, refusal of AICD therapy, and recent confusion over medical therapy. Recent hypotension requiring removal of heart failure therapy. Last EF 40-45% 02/21/15.  No chest pain.  Asking when cath is so he can call wife   Objective:  Vital Signs in the last 24 hours: Temp:  [97.8 F (36.6 C)-98.6 F (37 C)] 98.1 F (36.7 C) (06/06 0747) Pulse Rate:  [75-90] 79 (06/06 0900) Resp:  [0-20] 9 (06/06 0900) BP: (78-123)/(53-78) 112/63 mmHg (06/06 0900) SpO2:  [97 %-100 %] 99 % (06/06 0900) Weight:  [67.858 kg (149 lb 9.6 oz)] 67.858 kg (149 lb 9.6 oz) (06/06 0300)  Intake/Output from previous day: 06/05 0701 - 06/06 0700 In: 1178 [P.O.:800; I.V.:378] Out: 1625 [Urine:1625]   Physical Exam: General: Frail, in no acute distress. Head:  Normocephalic and atraumatic. Lungs: mild rales mid right lung. Heart: Normal S1 and S2.  Soft S murmur, no rubs or gallops.  Abdomen: soft, non-tender, positive bowel sounds. Extremities: No clubbing or cyanosis. No edema. Neurologic: Alert and oriented x 3. Soft spoken, no expressions    Lab Results:  Recent Labs  03/28/15 0305 03/29/15 0302  WBC 7.8 9.4  HGB 8.7* 9.0*  PLT 223 224    Recent Labs  03/28/15 0305 03/29/15 0302  NA 137 138  K 3.7 3.6  CL 97* 96*  CO2 31 31  GLUCOSE 99 78  BUN 15 14  CREATININE 1.32* 1.24    Recent Labs  03/26/15 1810 03/26/15 2142  TROPONINI 7.36* 5.08*   Hepatic Function Panel No results for input(s): PROT, ALBUMIN, AST, ALT, ALKPHOS, BILITOT, BILIDIR, IBILI in the last 72 hours.  Recent Labs  03/27/15 0442  CHOL 92   BNP 3300 from 600 in May Lactic acid decreased    Telemetry: No adverse rhythms Personally viewed.   EKG: Sinus tachycardia with incomplete left bundle branch block. Other than sinus tachycardia and no change compared to 03/08/2015  Echo:; 02/21/15  EF 45%  Reviewed   Scheduled Meds: . antiseptic oral rinse  7 mL Mouth Rinse BID  . aspirin EC  81 mg Oral Daily  . divalproex  500 mg Oral TID  . furosemide  40 mg Oral Daily  . nitroGLYCERIN  0.5 inch Topical 4 times per day  . sodium chloride  3 mL Intravenous Q12H  . sodium chloride  3 mL Intravenous Q12H   Continuous Infusions: . sodium chloride 10 mL/hr at 03/29/15 0626  . DOPamine 4 mcg/kg/min (03/29/15 0000)  . heparin 1,350 Units/hr (03/29/15 0221)   PRN Meds:.sodium chloride, sodium chloride, acetaminophen, nitroGLYCERIN, ondansetron (ZOFRAN) IV, sodium chloride, sodium chloride   Assessment/Plan:  Principal Problem:   Acute on chronic combined systolic and diastolic congestive heart failure, NYHA class 3 Active Problems:   S/P CABG (coronary artery bypass graft)   NSTEMI (non-ST elevated myocardial infarction)   AKI (acute kidney injury)   Acute respiratory failure with hypoxia   Hypotension   Acute on chronic combined systolic and diastolic congestive heart failure, NYHA class 4   ASSESSMENT:  1. Acute on chronic combined systolic and diastolic heart failure, likely secondary to excessive fluid intake. Rule out ischemic component given the elevated troponin levels. 2. Non-ST elevation myocardial  infarction (chest discomfort, known history of coronary disease with prior bypass grafting, and elevated troponin-7) 3. Hypotension, unexplained at this time. Rule out other causes such as cardiogenic given the elevated troponin levels. Lactic acidosis was present upon admission, improved. Off of BP meds.  4. Posttraumatic stress disorder 5. Acute kidney injury related to hypoperfusion - improved 6. Abdominal aortic aneurysm 4.8 cm in diameter 7. Seizure disorder 8.  Anemia  Plan:  Cath today.  Currently on nitor and dopamine for ? Renal perfusion per Dr Anne Fu I thinks this can be stopped post cath  CABG so ? Left radial if done from left has AAA 4.8 cm Cr better 1.24    [name] 03/29/2015, 10:13 AM

## 2015-03-29 NOTE — Progress Notes (Signed)
[facility]  Patient Status: Active with visits up until hospitalization   [facility] is providing the following services: SN, PT  If patient discharges after hours, please call [phone].   Fara Chuteiffany Clayton 03/29/2015, 9:25 AM

## 2015-03-29 NOTE — Progress Notes (Signed)
Patient ID: [name], male   DOB: [birth year], 64 y.o.   MRN: [id number]  Cards: Dr. Antoine PocheHochrein  Subjective:   64 year old gentleman with a history of ischemic cardiomyopathy, CABG, recent pancreatitis, protein calorie malnutrition, posttraumatic stress disorder, coronary artery disease with prior coronary bypass grafting, refusal of AICD therapy, and recent confusion over medical therapy. Recent hypotension requiring removal of heart failure therapy. Last EF 40-45% 02/21/15.  No chest pain.  Asking when cath is so he can call wife   Objective:  Vital Signs in the last 24 hours: Temp:  [97.8 F (36.6 C)-98.6 F (37 C)] 98.1 F (36.7 C) (06/06 0747) Pulse Rate:  [75-90] 79 (06/06 0900) Resp:  [0-20] 9 (06/06 0900) BP: (78-123)/(53-78) 112/63 mmHg (06/06 0900) SpO2:  [97 %-100 %] 99 % (06/06 0900) Weight:  [67.858 kg (149 lb 9.6 oz)] 67.858 kg (149 lb 9.6 oz) (06/06 0300)  Intake/Output from previous day: 06/05 0701 - 06/06 0700 In: 1178 [P.O.:800; I.V.:378] Out: 1625 [Urine:1625]   Physical Exam: General: Frail, in no acute distress. Head:  Normocephalic and atraumatic. Lungs: mild rales mid right lung. Heart: Normal S1 and S2.  Soft S murmur, no rubs or gallops.  Abdomen: soft, non-tender, positive bowel sounds. Extremities: No clubbing or cyanosis. No edema. Neurologic: Alert and oriented x 3. Soft spoken, no expressions    Lab Results:  Recent Labs  03/28/15 0305 03/29/15 0302  WBC 7.8 9.4  HGB 8.7* 9.0*  PLT 223 224    Recent Labs  03/28/15 0305 03/29/15 0302  NA 137 138  K 3.7 3.6  CL 97* 96*  CO2 31 31  GLUCOSE 99 78  BUN 15 14  CREATININE 1.32* 1.24    Recent Labs  03/26/15 1810 03/26/15 2142  TROPONINI 7.36* 5.08*   Hepatic Function Panel No results for input(s): PROT, ALBUMIN, AST, ALT, ALKPHOS, BILITOT, BILIDIR, IBILI in the last 72 hours.  Recent Labs  03/27/15 0442  CHOL 92   BNP 3300 from 600 in May Lactic acid decreased    Telemetry: No adverse rhythms Personally viewed.   EKG: Sinus tachycardia with incomplete left bundle branch block. Other than sinus tachycardia and no change compared to 03/08/2015  Echo:; 02/21/15  EF 45%  Reviewed   Scheduled Meds: . antiseptic oral rinse  7 mL Mouth Rinse BID  . aspirin EC  81 mg Oral Daily  . divalproex  500 mg Oral TID  . furosemide  40 mg Oral Daily  . nitroGLYCERIN  0.5 inch Topical 4 times per day  . sodium chloride  3 mL Intravenous Q12H  . sodium chloride  3 mL Intravenous Q12H   Continuous Infusions: . sodium chloride 10 mL/hr at 03/29/15 0626  . DOPamine 4 mcg/kg/min (03/29/15 0000)  . heparin 1,350 Units/hr (03/29/15 0221)   PRN Meds:.sodium chloride, sodium chloride, acetaminophen, nitroGLYCERIN, ondansetron (ZOFRAN) IV, sodium chloride, sodium chloride   Assessment/Plan:  Principal Problem:   Acute on chronic combined systolic and diastolic congestive heart failure, NYHA class 3 Active Problems:   S/P CABG (coronary artery bypass graft)   NSTEMI (non-ST elevated myocardial infarction)   AKI (acute kidney injury)   Acute respiratory failure with hypoxia   Hypotension   Acute on chronic combined systolic and diastolic congestive heart failure, NYHA class 4   ASSESSMENT:  1. Acute on chronic combined systolic and diastolic heart failure, likely secondary to excessive fluid intake. Rule out ischemic component given the elevated troponin levels. 2. Non-ST elevation myocardial  infarction (chest discomfort, known history of coronary disease with prior bypass grafting, and elevated troponin-7) 3. Hypotension, unexplained at this time. Rule out other causes such as cardiogenic given the elevated troponin levels. Lactic acidosis was present upon admission, improved. Off of BP meds.  4. Posttraumatic stress disorder 5. Acute kidney injury related to hypoperfusion - improved 6. Abdominal aortic aneurysm 4.8 cm in diameter 7. Seizure disorder 8.  Anemia  Plan:  Cath today.  Currently on nitor and dopamine for ? Renal perfusion per Dr Anne Fu I thinks this can be stopped post cath  CABG so ? Left radial if done from left has AAA 4.8 cm Cr better 1.24    [name] 03/29/2015, 10:13 AM

## 2015-03-29 NOTE — Care Management Note (Signed)
Case Management Note  Patient Details  Name: Austin LewandowskyRobert C Cogar Rivera MRN: [id number] Date of Birth: September 10, 1951  Subjective/Objective:      Adm w chf              Action/Plan: lives w wife, pcp dr Judie Grievebryan hess   Expected Discharge Date:                  Expected Discharge Plan:  Home w Home Health Services  In-House Referral:     Discharge planning Services     Post Acute Care Choice:    Choice offered to:     DME Arranged:    DME Agency:     HH Arranged:    HH Agency:     Status of Service:     Medicare Important Message Given:  Yes Date Medicare IM Given:  03/29/15 Medicare IM give by:  debbie Aslan Montagna rn,bsn Date Additional Medicare IM Given:    Additional Medicare Important Message give by:     If discussed at Long Length of Stay Meetings, dates discussed:    Additional Comments: ur review done  Hanley HaysDowell, Regene Mccarthy T, RN 03/29/2015, 11:47 AM

## 2015-03-29 NOTE — Progress Notes (Signed)
ANTICOAGULATION CONSULT NOTE - Follow Up Consult  Pharmacy Consult for Heparin Indication: chest pain/ACS  Allergies  Allergen Reactions  . Aspirin     To much aspirin make his bleed on the inside can take a small amount.  . Flomax [Tamsulosin Hcl] Swelling  . Morphine And Related Other (See Comments)    "MAKES HIM MEAN"    Patient Measurements: Height: 6' (182.9 cm) Weight: 149 lb 9.6 oz (67.858 kg) IBW/kg (Calculated) : 77.6  Vital Signs: Temp: 98.1 F (36.7 C) (06/06 0747) Temp Source: Oral (06/06 0747) BP: 112/63 mmHg (06/06 0900) Pulse Rate: 79 (06/06 0900)  Labs:  Recent Labs  03/26/15 1810  03/26/15 2142  03/27/15 0442  03/28/15 0146 03/28/15 0305 03/28/15 1350 03/29/15 0302  HGB  --   --   --   < > 8.7*  --   --  8.7*  --  9.0*  HCT  --   --   --   --  26.8*  --   --  26.4*  --  27.3*  PLT  --   --   --   --  237  --   --  223  --  224  LABPROT  --   --   --   --   --   --   --   --   --  14.8  INR  --   --   --   --   --   --   --   --   --  1.15  HEPARINUNFRC  --   < > <0.10*  --  <0.10*  < > 0.30  --  0.52 0.44  CREATININE  --   --   --   --  1.45*  --   --  1.32*  --  1.24  TROPONINI 7.36*  --  5.08*  --   --   --   --   --   --   --   < > = values in this interval not displayed.  Estimated Creatinine Clearance: 57 mL/min (by C-G formula based on Cr of 1.24).   Medications:  Heparin @ 1350 units/hr  Assessment: 63yom continues on heparin for NSTEMI with plans for cath today. Heparin level is therapeutic. CBC is stable. No bleeding reported.  Goal of Therapy:  Heparin level 0.3-0.7 units/ml Monitor platelets by anticoagulation protocol: Yes   Plan:  1) Continue heparin at 1350 units/hr 2) Follow up after cath  Fredrik RiggerMarkle, Ridley Dileo Sue 03/29/2015,9:15 AM

## 2015-03-29 NOTE — Progress Notes (Signed)
Checking on vitals signs of patient noted low b/p. This was lower than previous assessment. Patient on Dopamine at to improve renal function. Went to recheck B/P and found cuff off patients arm but tourniquet from lab draw this am was still intact tight around upper arm. Proper recheck of b/p was normal. See VSS. Proper forms will be reported for this error.

## 2015-03-29 NOTE — Interval H&P Note (Signed)
History and Physical Interval Note:  03/29/2015 6:29 PM  [name]  has presented today for surgery, with the diagnosis of unstable angina - NSTEMI  The various methods of treatment have been discussed with the patient and family. After consideration of risks, benefits and other options for treatment, the patient has consented to  Procedure(s): Left Heart Cath and Coronary Angiography (N/A) with Graft Angiography & possible PCI as a surgical intervention .  The patient's history has been reviewed, patient examined, no change in status, stable for surgery.  I have reviewed the patient's chart and labs.  Questions were answered to the patient's satisfaction.     HARDING, DAVID W  Cath Lab Visit (complete for each Cath Lab visit)  Clinical Evaluation Leading to the Procedure:   ACS: Yes.    Non-ACS:    Anginal Classification: CCS IV  Anti-ischemic medical therapy: Minimal Therapy (1 class of medications) - limited due to hypotension  Non-Invasive Test Results: No non-invasive testing performed  Prior CABG: Previous CABG

## 2015-03-30 ENCOUNTER — Encounter (HOSPITAL_COMMUNITY): Payer: Self-pay | Admitting: Cardiology

## 2015-03-30 LAB — CBC
HCT: 27 % — ABNORMAL LOW (ref 39.0–52.0)
Hemoglobin: 9.1 g/dL — ABNORMAL LOW (ref 13.0–17.0)
MCH: 34.1 pg — AB (ref 26.0–34.0)
MCHC: 33.7 g/dL (ref 30.0–36.0)
MCV: 101.1 fL — AB (ref 78.0–100.0)
Platelets: 230 10*3/uL (ref 150–400)
RBC: 2.67 MIL/uL — ABNORMAL LOW (ref 4.22–5.81)
RDW: 15.4 % (ref 11.5–15.5)
WBC: 9.9 10*3/uL (ref 4.0–10.5)

## 2015-03-30 MED ORDER — LISINOPRIL 2.5 MG PO TABS
2.5000 mg | ORAL_TABLET | Freq: Every day | ORAL | Status: DC
  Administered 2015-03-30 – 2015-04-04 (×5): 2.5 mg via ORAL
  Filled 2015-03-30 (×7): qty 1

## 2015-03-30 MED ORDER — DOPAMINE-DEXTROSE 3.2-5 MG/ML-% IV SOLN: 2.0000 ug/kg/min | INTRAVENOUS | Status: DC

## 2015-03-30 NOTE — Progress Notes (Signed)
Physical Therapy Treatment Patient Details Name: Austin LewandowskyRobert C Kielty Rivera MRN: [id number] DOB: 1951/01/15 Today's Date: 03/30/2015    History of Present Illness Patient is a 64 yo male admitted 03/26/15 with SOB and tachycardia.  Patient with CHF exacerbation and NSTEMI.  Cardiac cath 6/6.  PMH:  ICM, EF 40-45%, CAD, CABG, CHF, COPD, PTSD, TBI, seizures, back pain    PT Comments    Pt progressing with mobility limited by pain and BP. Pt with increased back and neck pain from being in bed with decreased ROM. Pt educated for mobility and gait but denied HEP end of session due to fatigue and pain. Wife present throughout. Will continue to follow and encouraged increased mobility with nursing.   Follow Up Recommendations  Home health PT;Supervision/Assistance - 24 hour     Equipment Recommendations       Recommendations for Other Services       Precautions / Restrictions Precautions Precautions: Fall    Mobility  Bed Mobility Overal bed mobility: Needs Assistance Bed Mobility: Supine to Sit     Supine to sit: Min assist;HOB elevated     General bed mobility comments: cues for sequence with min assist to elevate trunk, increased time  Transfers Overall transfer level: Needs assistance   Transfers: Sit to/from Stand Sit to Stand: Min assist         General transfer comment: cues for posture, hand placement, sequence  and safety with assist to rise from surface as well as cues to control descent  Ambulation/Gait Ambulation/Gait assistance: Min assist;+2 safety/equipment Ambulation Distance (Feet): 20 Feet Assistive device: Rolling walker (2 wheeled) Gait Pattern/deviations: Step-through pattern;Decreased stride length;Trunk flexed   Gait velocity interpretation: Below normal speed for age/gender General Gait Details: cues for posture, position in RW and chair to follow as pt fatigues quickly   Stairs            Wheelchair Mobility    Modified Rankin (Stroke Patients  Only)       Balance Overall balance assessment: Needs assistance   Sitting balance-Leahy Scale: Fair       Standing balance-Leahy Scale: Poor                      Cognition Arousal/Alertness: Awake/alert Behavior During Therapy: Flat affect Overall Cognitive Status: History of cognitive impairments - at baseline                      Exercises      General Comments        Pertinent Vitals/Pain Pain Score: 7  Pain Location: back and neck pain  Pain Descriptors / Indicators: Aching;Sore Pain Intervention(s): Patient requesting pain meds-RN notified;Repositioned;Limited activity within patient's tolerance;Monitored during session  BP EOB 111/68 After initial gait 82/54 After 2nd gait 97/58 HR 87 sats 100% on 2L    Home Living                      Prior Function            PT Goals (current goals can now be found in the care plan section) Progress towards PT goals: Progressing toward goals    Frequency       PT Plan Current plan remains appropriate    Co-evaluation             End of Session Equipment Utilized During Treatment: Gait belt;Oxygen Activity Tolerance: Patient limited by fatigue Patient left: in chair;with call  bell/phone within reach;with nursing/sitter in room;with family/visitor present     Time: 0852-0919 PT Time Calculation (min) (ACUTE ONLY): 27 min  Charges:  $Gait Training: 8-22 mins $Therapeutic Activity: 8-22 mins                    G Codes:      Delorse Lek 03/30/2015, 10:51 AM Delaney Meigs, PT [phone]

## 2015-03-30 NOTE — Progress Notes (Signed)
Patient ID: [name], male   DOB: 26-Mar-1951, 64 y.o.   MRN: [id number]  Cards: Dr. Antoine PocheHochrein  Subjective:   64 year old gentleman with a history of ischemic cardiomyopathy, CABG, recent pancreatitis, protein calorie malnutrition, posttraumatic stress disorder, coronary artery disease with prior coronary bypass grafting, refusal of AICD therapy, and recent confusion over medical therapy. Recent hypotension requiring removal of heart failure therapy. Last EF 40-45% 02/21/15. 25% by cath 03/29/15    No chest pain.  Asking when cath is so he can call wife   Objective:  Vital Signs in the last 24 hours: Temp:  [97.6 F (36.4 C)-98 F (36.7 C)] 98 F (36.7 C) (06/07 0402) Pulse Rate:  [73-113] 79 (06/07 0700) Resp:  [0-22] 18 (06/07 0700) BP: (78-130)/(50-85) 108/64 mmHg (06/07 0700) SpO2:  [88 %-100 %] 100 % (06/07 0700) Weight:  [67.178 kg (148 lb 1.6 oz)] 67.178 kg (148 lb 1.6 oz) (06/07 0515)  Intake/Output from previous day: 06/06 0701 - 06/07 0700 In: 524.5 [I.V.:524.5] Out: 950 [Urine:950]   Physical Exam: General: Frail, in no acute distress. Head:  Normocephalic and atraumatic. Lungs: mild rales mid right lung. Heart: Normal S1 and S2.  Soft S murmur, no rubs or gallops.  Abdomen: soft, non-tender, positive bowel sounds. Extremities: No clubbing or cyanosis. No edema. Neurologic: Alert and oriented x 3. Soft spoken, no expressions    Lab Results:  Recent Labs  03/29/15 0302 03/30/15 0245  WBC 9.4 9.9  HGB 9.0* 9.1*  PLT 224 230    Recent Labs  03/28/15 0305 03/29/15 0302  NA 137 138  K 3.7 3.6  CL 97* 96*  CO2 31 31  GLUCOSE 99 78  BUN 15 14  CREATININE 1.32* 1.24     Telemetry:  NSR 03/30/2015   EKG: Sinus tachycardia with incomplete left bundle branch block. Other than sinus tachycardia and no change compared to 03/08/2015  Echo:; 02/21/15  EF 45%  Reviewed   25% by cath 03/29/15  [name]   Scheduled Meds: . antiseptic oral rinse  7 mL Mouth  Rinse BID  . aspirin EC  81 mg Oral Daily  . divalproex  500 mg Oral TID  . furosemide  40 mg Oral Daily  . heparin  5,000 Units Subcutaneous 3 times per day  . nitroGLYCERIN  0.5 inch Topical 4 times per day  . sodium chloride  3 mL Intravenous Q12H   Continuous Infusions: . DOPamine 4 mcg/kg/min (03/29/15 1851)   PRN Meds:.sodium chloride, sodium chloride, acetaminophen, nitroGLYCERIN, ondansetron (ZOFRAN) IV, sodium chloride   Assessment/Plan:  Principal Problem:   Acute on chronic combined systolic and diastolic congestive heart failure, NYHA class 3 Active Problems:   S/P CABG (coronary artery bypass graft)   NSTEMI (non-ST elevated myocardial infarction)   AKI (acute kidney injury)   Acute respiratory failure with hypoxia   Hypotension   Acute on chronic combined systolic and diastolic congestive heart failure, NYHA class 4   Elevated troponin   Respiratory distress, acute   SOB (shortness of breath)   ASSESSMENT:  1. Acute on chronic combined systolic and diastolic heart failure:  Has been hypotensive with ACE and beta blocker  EDP 14 post A wave 25 at cath Will try to start low dose ace again  2. Non-ST elevation myocardial infarction cath with no revascularizeable  lesions  3. Hypotension, improved  D/C dopamine   4. Posttraumatic stress disorder 5. Acute kidney injury related to hypoperfusion - improved d/c dopamine  6. Abdominal aortic aneurysm 4.8 cm in diameter stable no abdominal pain  7. Seizure disorder 8. Anemia  Plan: D/C foley.  Restart low dose lisinopril.  PT/OT  D/C dopamine   [name] 03/30/2015, 8:27 AM

## 2015-03-31 LAB — CBC
HCT: 25.6 % — ABNORMAL LOW (ref 39.0–52.0)
Hemoglobin: 8.6 g/dL — ABNORMAL LOW (ref 13.0–17.0)
MCH: 34.1 pg — ABNORMAL HIGH (ref 26.0–34.0)
MCHC: 33.6 g/dL (ref 30.0–36.0)
MCV: 101.6 fL — AB (ref 78.0–100.0)
PLATELETS: 165 10*3/uL (ref 150–400)
RBC: 2.52 MIL/uL — AB (ref 4.22–5.81)
RDW: 15.5 % (ref 11.5–15.5)
WBC: 9.6 10*3/uL (ref 4.0–10.5)

## 2015-03-31 MED FILL — Heparin Sodium (Porcine) 2 Unit/ML in Sodium Chloride 0.9%: INTRAMUSCULAR | Qty: 500 | Status: AC

## 2015-03-31 NOTE — Progress Notes (Signed)
Patient ID: [name], male   DOB: 12/16/1950, 64 y.o.   MRN: [id number] Patient ID: [name], male   DOB: 12/16/1950, 64 y.o.   MRN: [id number]  Cards: Dr. Antoine PocheHochrein  Subjective:   64 year old gentleman with a history of ischemic cardiomyopathy, CABG, recent pancreatitis, protein calorie malnutrition, posttraumatic stress disorder, coronary artery disease with prior coronary bypass grafting, refusal of AICD therapy, and recent confusion over medical therapy. Recent hypotension requiring removal of heart failure therapy. Last EF 40-45% 02/21/15. 25% by cath 03/29/15    No chest pain.  Asking when cath is so he can call wife   Objective:  Vital Signs in the last 24 hours: Temp:  [97.3 F (36.3 C)-98.7 F (37.1 C)] 98 F (36.7 C) (06/08 0800) Pulse Rate:  [66-83] 67 (06/08 0600) Resp:  [0-25] 16 (06/08 0600) BP: (78-116)/(44-67) 79/45 mmHg (06/08 0800) SpO2:  [94 %-100 %] 100 % (06/08 0800) Weight:  [66.361 kg (146 lb 4.8 oz)] 66.361 kg (146 lb 4.8 oz) (06/08 0557)  Intake/Output from previous day: 06/07 0701 - 06/08 0700 In: 700.4 [P.O.:680; I.V.:20.4] Out: 400 [Urine:400]   Physical Exam: General: Frail, in no acute distress. Head:  Normocephalic and atraumatic. Lungs: mild rales mid right lung. Heart: Normal S1 and S2.  Soft S murmur, no rubs or gallops.  Abdomen: soft, non-tender, positive bowel sounds. Extremities: No clubbing or cyanosis. No edema. Neurologic: Alert and oriented x 3. Soft spoken, no expressions    Lab Results:  Recent Labs  03/30/15 0245 03/31/15 0235  WBC 9.9 9.6  HGB 9.1* 8.6*  PLT 230 165    Recent Labs  03/29/15 0302  NA 138  K 3.6  CL 96*  CO2 31  GLUCOSE 78  BUN 14  CREATININE 1.24     Telemetry:  NSR 03/31/2015   EKG: Sinus tachycardia with incomplete left bundle branch block. Other than sinus tachycardia and no change compared to 03/08/2015  Echo:; 02/21/15  EF 45%  Reviewed   25% by cath 03/29/15  [name]   Scheduled  Meds: . antiseptic oral rinse  7 mL Mouth Rinse BID  . aspirin EC  81 mg Oral Daily  . divalproex  500 mg Oral TID  . furosemide  40 mg Oral Daily  . heparin  5,000 Units Subcutaneous 3 times per day  . lisinopril  2.5 mg Oral Daily  . nitroGLYCERIN  0.5 inch Topical 4 times per day  . sodium chloride  3 mL Intravenous Q12H   Continuous Infusions: . DOPamine Stopped (03/30/15 1500)   PRN Meds:.sodium chloride, sodium chloride, acetaminophen, nitroGLYCERIN, ondansetron (ZOFRAN) IV, sodium chloride   Assessment/Plan:  Principal Problem:   Acute on chronic combined systolic and diastolic congestive heart failure, NYHA class 3 Active Problems:   S/P CABG (coronary artery bypass graft)   NSTEMI (non-ST elevated myocardial infarction)   AKI (acute kidney injury)   Acute respiratory failure with hypoxia   Hypotension   Acute on chronic combined systolic and diastolic congestive heart failure, NYHA class 4   Elevated troponin   Respiratory distress, acute   SOB (shortness of breath)   ASSESSMENT:  1. Acute on chronic combined systolic and diastolic heart failure:  Lungs clear  BP 80" systolic only had one dose of lisinopril 2.5 mg will hold today 2. Non-ST elevation myocardial infarction cath with no revascularizeable  lesions  3. Hypotension, hold ACE  4. Posttraumatic stress disorder 5. Acute kidney injury related to hypoperfusion -  improved d/c dopamine  6. Abdominal aortic aneurysm 4.8 cm in diameter stable no abdominal pain palpable on exam  7. Seizure disorder 8. Anemia  Plan: Continue with PT/OT OOB  Medical Rx CHF limited again by low BP  No intervention planned for CAD  Inpatient rehab consult   [name] 03/31/2015, 8:26 AM

## 2015-03-31 NOTE — Care Management Note (Signed)
Case Management Note  Patient Details  Name: Austin LewandowskyRobert C Ojeda Rivera MRN: [id number] Date of Birth: 1951/05/28  Subjective/Objective:         Adm w heart failure           Action/Plan: lives w fam, pcp [place] [facility]   Expected Discharge Date:                  Expected Discharge Plan:  Home w Home Health Services  In-House Referral:     Discharge planning Services     Post Acute Care Choice:  Resumption of Svcs/PTA Provider Choice offered to:     DME Arranged:    DME Agency:     HH Arranged:  RN, PT HH Agency:  [facility]  Status of Service:     Medicare Important Message Given:  Yes Date Medicare IM Given:  03/29/15 Medicare IM give by:  debbie Shawon Denzer rn,bsn Date Additional Medicare IM Given:    Additional Medicare Important Message give by:     If discussed at Long Length of Stay Meetings, dates discussed:    Additional Comments: pt states new pcp [facility]. He is active w [facility]. Have alerted donna w [facility].  Hanley Haysowell, Maudene Stotler T, RN 03/31/2015, 11:15 AM

## 2015-03-31 NOTE — Progress Notes (Signed)
Consistent hypotension in the mid 70s(75/44), patient is alert and oriented and does not endorse chest pain. Md Rathore notified, will continue to monitor

## 2015-03-31 NOTE — Progress Notes (Signed)
Thank you for consult on Mr. [name]. Chart reviewed and note that patient is making slow steady progress with therapy. He is limited by hypotension as well as pain with poor endurance. Doubt that he will be able to tolerate higher levels of therapy on CIR. Therapy recommending HHPT for follow up past discharge and anticipate that he should be able to reach supervision level as medical issues stabilize. Will defer CIR consult for now.

## 2015-04-01 LAB — CULTURE, BLOOD (ROUTINE X 2)
CULTURE: NO GROWTH
Culture: NO GROWTH

## 2015-04-01 LAB — CBC
HEMATOCRIT: 24.7 % — AB (ref 39.0–52.0)
Hemoglobin: 8.2 g/dL — ABNORMAL LOW (ref 13.0–17.0)
MCH: 34 pg (ref 26.0–34.0)
MCHC: 33.2 g/dL (ref 30.0–36.0)
MCV: 102.5 fL — ABNORMAL HIGH (ref 78.0–100.0)
Platelets: 155 10*3/uL (ref 150–400)
RBC: 2.41 MIL/uL — ABNORMAL LOW (ref 4.22–5.81)
RDW: 15.9 % — AB (ref 11.5–15.5)
WBC: 8.9 10*3/uL (ref 4.0–10.5)

## 2015-04-01 MED ORDER — TRAMADOL HCL 50 MG PO TABS
50.0000 mg | ORAL_TABLET | Freq: Four times a day (QID) | ORAL | Status: DC | PRN
  Administered 2015-04-01 – 2015-04-08 (×4): 50 mg via ORAL
  Filled 2015-04-01 (×5): qty 1

## 2015-04-01 MED ORDER — RANOLAZINE ER 500 MG PO TB12
500.0000 mg | ORAL_TABLET | Freq: Two times a day (BID) | ORAL | Status: DC
  Administered 2015-04-01 – 2015-04-09 (×15): 500 mg via ORAL
  Filled 2015-04-01 (×18): qty 1

## 2015-04-01 NOTE — Progress Notes (Signed)
PT Cancellation Note  Patient Details Name: [name] MRN: [id number] DOB: [birth year]   Cancelled Treatment:    Reason Eval/Treat Not Completed: Medical issues which prohibited therapy.  Per RN, patient having chest pain today.  Will hold PT today and return tomorrow to attempt PT session.   Vena Austria 04/01/2015, 5:30 PM Durenda Hurt. Renaldo Fiddler, [facility] Acute Rehab Services Pager [phone]

## 2015-04-01 NOTE — Progress Notes (Signed)
Cards: Dr. Antoine Poche  Subjective:   64 year old gentleman with a history of ischemic cardiomyopathy, CABG, recent pancreatitis, protein calorie malnutrition, posttraumatic stress disorder, coronary artery disease with prior coronary bypass grafting, refusal of AICD therapy, and recent confusion over medical therapy. Recent hypotension requiring removal of heart failure therapy. Last EF 40-45% 02/21/15. 25% by cath 03/29/15    Feeling stronger no chest pain dyspnea improved   Objective:  Vital Signs in the last 24 hours: Temp:  [98.2 F (36.8 C)-98.7 F (37.1 C)] 98.3 F (36.8 C) (06/09 0800) Pulse Rate:  [63-82] 67 (06/09 0800) Resp:  [14-26] 26 (06/09 0800) BP: (80-101)/(44-65) 86/50 mmHg (06/09 0800) SpO2:  [94 %-100 %] 94 % (06/09 0800) Weight:  [67.132 kg (148 lb)] 67.132 kg (148 lb) (06/09 8309)  Intake/Output from previous day: 06/08 0701 - 06/09 0700 In: 440 [P.O.:440] Out: 850 [Urine:850]   Physical Exam: General: Frail, in no acute distress. Head:  Normocephalic and atraumatic. Lungs: mild rales mid right lung. Heart: Normal S1 and S2.  Soft S murmur, no rubs or gallops.  Abdomen: soft, non-tender, positive bowel sounds. Extremities: No clubbing or cyanosis. No edema. Neurologic: Alert and oriented x 3. Soft spoken, no expressions    Lab Results:  Recent Labs  03/31/15 0235 04/01/15 0242  WBC 9.6 8.9  HGB 8.6* 8.2*  PLT 165 155   No results for input(s): NA, K, CL, CO2, GLUCOSE, BUN, CREATININE in the last 72 hours.   Telemetry:  NSR 04/01/2015   EKG: Sinus tachycardia with incomplete left bundle branch block. Other than sinus tachycardia and no change compared to 03/08/2015  Echo:; 02/21/15  EF 45%  Reviewed   25% by cath 03/29/15  [name]   Scheduled Meds: . antiseptic oral rinse  7 mL Mouth Rinse BID  . aspirin EC  81 mg Oral Daily  . divalproex  500 mg Oral TID  . furosemide  40 mg Oral Daily  . heparin  5,000 Units Subcutaneous 3 times per day  .  lisinopril  2.5 mg Oral Daily  . nitroGLYCERIN  0.5 inch Topical 4 times per day  . sodium chloride  3 mL Intravenous Q12H   Continuous Infusions: . DOPamine Stopped (03/30/15 1500)   PRN Meds:.sodium chloride, sodium chloride, acetaminophen, nitroGLYCERIN, ondansetron (ZOFRAN) IV, sodium chloride   Assessment/Plan:  Principal Problem:   Acute on chronic combined systolic and diastolic congestive heart failure, NYHA class 3 Active Problems:   S/P CABG (coronary artery bypass graft)   NSTEMI (non-ST elevated myocardial infarction)   AKI (acute kidney injury)   Acute respiratory failure with hypoxia   Hypotension   Acute on chronic combined systolic and diastolic congestive heart failure, NYHA class 4   Elevated troponin   Respiratory distress, acute   SOB (shortness of breath)   ASSESSMENT:  1. Acute on chronic combined systolic and diastolic heart failure:  Lungs clear  BP 80" systolic only had one dose of lisinopril 2.5 mg will hold today 2. Non-ST elevation myocardial infarction cath with no revascularizeable  lesions  3. Hypotension, hold ACE  4. Posttraumatic stress disorder 5. Acute kidney injury related to hypoperfusion - improved d/c dopamine  6. Abdominal aortic aneurysm 4.8 cm in diameter stable no abdominal pain palpable on exam  7. Seizure disorder 8. Anemia will guaic stools may need transfusion.    Plan: Continue with PT/OT OOB  Medical Rx CHF limited again by low BP  No intervention planned for CAD  Inpatient rehab consult done  and they indicated He has home health services in place and did not need inpatient rehab.  Transfer out of unit in am   [name] 04/01/2015, 9:24 AM

## 2015-04-02 LAB — LIPASE, BLOOD: Lipase: 106 U/L — ABNORMAL HIGH (ref 22–51)

## 2015-04-02 NOTE — Progress Notes (Signed)
Called by nursing staff regarding lack of appetite. Per nurse, patient has not have much to eat for the past 48 hours. Recent cath without severe native CAD not amenable to PCI. Also has h/o acute on chronic pancreatitis confirmed on previous CT of abdomen in Apr 2016. Still somewhat hypotensive last night.   On exam, patient is very frail. Guarding abdomen. When asked about abdominal pain, he states it hurts here and there but unable to tell me specific location. Will hold off on reordering CT of abdomen at this time. Last lipase normal 7 days ago. Will repeat a lipase for now. Continue IV zofran for symptomatic control. Still very nauseated which lead to lack of appetite.   Ramond Dial PA Pager: [phone]

## 2015-04-02 NOTE — Progress Notes (Signed)
Physical Therapy Treatment Patient Details Name: [name] MRN: [id number] DOB: 1951-06-12 Today's Date: 04/02/2015    History of Present Illness Patient is a 64 yo male admitted 03/26/15 with SOB and tachycardia.  Patient with CHF exacerbation and NSTEMI.  Cardiac cath 6/6.  PMH:  ICM, EF 40-45%, CAD, CABG, CHF, COPD, PTSD, TBI, seizures, back pain    PT Comments    Pt stating he keeps burping and hiccuping making him feel poorly but states that his back and neck pain is better. Pt able to ambulate in hall today and perform HEP with encouragement and reassurance. Pt encouraged to continue to mobilize with nursing and perform HEP. Will continue to follow.     Follow Up Recommendations  Home health PT;Supervision/Assistance - 24 hour     Equipment Recommendations       Recommendations for Other Services       Precautions / Restrictions Precautions Precautions: Fall Restrictions Weight Bearing Restrictions: No    Mobility  Bed Mobility Overal bed mobility: Needs Assistance   Rolling: Mod assist Sidelying to sit: Mod assist       General bed mobility comments: cues for sequence with min assist to elevate trunk, increased time  Transfers Overall transfer level: Needs assistance     Sit to Stand: Min assist         General transfer comment: cues for posture, hand placement, sequence  and safety with assist to rise from surface as well as cues to control descent  Ambulation/Gait Ambulation/Gait assistance: Min guard Ambulation Distance (Feet): 180 Feet Assistive device: Rolling walker (2 wheeled) Gait Pattern/deviations: Step-through pattern;Decreased stride length;Trunk flexed   Gait velocity interpretation: Below normal speed for age/gender General Gait Details: cues for posture, position in RW and chair to follow but will not be required next session   Stairs            Wheelchair Mobility    Modified Rankin (Stroke Patients Only)        Balance Overall balance assessment: Needs assistance   Sitting balance-Leahy Scale: Fair       Standing balance-Leahy Scale: Poor                      Cognition Arousal/Alertness: Awake/alert Behavior During Therapy: Flat affect Overall Cognitive Status: History of cognitive impairments - at baseline                      Exercises General Exercises - Lower Extremity Long Arc Quad: AAROM;Seated;Both;15 reps Hip Flexion/Marching: AAROM;Seated;Both;15 reps    General Comments        Pertinent Vitals/Pain Pain Score: 6  Pain Location: back and belly Pain Descriptors / Indicators: Aching Pain Intervention(s): Repositioned;Limited activity within patient's tolerance  HR 83-90 sats 93-95% on RA 108/66    Home Living                      Prior Function            PT Goals (current goals can now be found in the care plan section) Progress towards PT goals: Progressing toward goals    Frequency       PT Plan Current plan remains appropriate    Co-evaluation             End of Session Equipment Utilized During Treatment: Gait belt Activity Tolerance: Patient tolerated treatment well Patient left: in chair;with call bell/phone within reach  Time: 9629-5284 PT Time Calculation (min) (ACUTE ONLY): 26 min  Charges:  $Gait Training: 8-22 mins $Therapeutic Exercise: 8-22 mins                    G Codes:      Delorse Lek 04-26-15, 10:52 AM Delaney Meigs, PT [phone]

## 2015-04-02 NOTE — Progress Notes (Signed)
Patient ID: [name], male   DOB: 1951/03/25, 64 y.o.   MRN: [id number]  Cards: Dr. Antoine Poche  Subjective:   64 year old gentleman with a history of ischemic cardiomyopathy, CABG, recent pancreatitis, protein calorie malnutrition, posttraumatic stress disorder, coronary artery disease with prior coronary bypass grafting, refusal of AICD therapy, and recent confusion over medical therapy. Recent hypotension requiring removal of heart failure therapy. Last EF 40-45% 02/21/15. 25% by cath 03/29/15    Hiccups with nausea and vomiting   Objective:  Vital Signs in the last 24 hours: Temp:  [97.9 F (36.6 C)-98.9 F (37.2 C)] 98.9 F (37.2 C) (06/10 0400) Pulse Rate:  [73-105] 85 (06/10 1000) Resp:  [9-29] 21 (06/10 1000) BP: (82-128)/(53-84) 85/58 mmHg (06/10 1000) SpO2:  [92 %-100 %] 97 % (06/10 1000) Weight:  [67.767 kg (149 lb 6.4 oz)] 67.767 kg (149 lb 6.4 oz) (06/10 0500)  Intake/Output from previous day: 06/09 0701 - 06/10 0700 In: 660 [P.O.:660] Out: 500 [Urine:500]   Physical Exam: General: Frail, in no acute distress. Head:  Normocephalic and atraumatic. Lungs: mild rales mid right lung. Heart: Normal S1 and S2.  Soft S murmur, no rubs or gallops.  Abdomen: soft, non-tender, positive bowel sounds. Extremities: No clubbing or cyanosis. No edema. Neurologic: Alert and oriented x 3. Soft spoken, no expressions    Lab Results:  Recent Labs  03/31/15 0235 04/01/15 0242  WBC 9.6 8.9  HGB 8.6* 8.2*  PLT 165 155   No results for input(s): NA, K, CL, CO2, GLUCOSE, BUN, CREATININE in the last 72 hours.   Telemetry:  NSR 04/02/2015   EKG: Sinus tachycardia with incomplete left bundle branch block. Other than sinus tachycardia and no change compared to 03/08/2015  Echo:; 02/21/15  EF 45%  Reviewed   25% by cath 03/29/15  [name]   Scheduled Meds: . antiseptic oral rinse  7 mL Mouth Rinse BID  . aspirin EC  81 mg Oral Daily  . divalproex  500 mg Oral TID  . furosemide   40 mg Oral Daily  . heparin  5,000 Units Subcutaneous 3 times per day  . lisinopril  2.5 mg Oral Daily  . nitroGLYCERIN  0.5 inch Topical 4 times per day  . ranolazine  500 mg Oral BID  . sodium chloride  3 mL Intravenous Q12H   Continuous Infusions:   PRN Meds:.sodium chloride, sodium chloride, acetaminophen, nitroGLYCERIN, ondansetron (ZOFRAN) IV, sodium chloride, traMADol   Assessment/Plan:  Principal Problem:   Acute on chronic combined systolic and diastolic congestive heart failure, NYHA class 3 Active Problems:   S/P CABG (coronary artery bypass graft)   NSTEMI (non-ST elevated myocardial infarction)   AKI (acute kidney injury)   Acute respiratory failure with hypoxia   Hypotension   Acute on chronic combined systolic and diastolic congestive heart failure, NYHA class 4   Elevated troponin   Respiratory distress, acute   SOB (shortness of breath)   ASSESSMENT:  1. Acute on chronic combined systolic and diastolic heart failure:  Lungs clear  BP 80" systolic only had one dose of lisinopril 2.5 mg will hold today 2. Non-ST elevation myocardial infarction cath with no revascularizeable  lesions  3. Hypotension, better tolerating ACE   4. Posttraumatic stress disorder 5. Acute kidney injury related to hypoperfusion - improved d/c dopamine  6. Abdominal aortic aneurysm 4.8 cm in diameter stable no abdominal pain palpable on exam  7. Seizure disorder 8. Anemia will guaic stools may need transfusion.  Plan: Continue with PT/OT OOB  Medical Rx CHF limited again by low BP  No intervention planned for CAD  Inpatient rehab consult done and they indicated He has home health services in place and did not need inpatient rehab.  Not clear why stomach upset exam benign except for palpable AAA PRN zofran   [name] 04/02/2015, 10:27 AM

## 2015-04-03 ENCOUNTER — Inpatient Hospital Stay (HOSPITAL_COMMUNITY): Payer: Medicare Other

## 2015-04-03 LAB — BASIC METABOLIC PANEL
Anion gap: 12 (ref 5–15)
BUN: 22 mg/dL — ABNORMAL HIGH (ref 6–20)
CALCIUM: 8.6 mg/dL — AB (ref 8.9–10.3)
CHLORIDE: 85 mmol/L — AB (ref 101–111)
CO2: 31 mmol/L (ref 22–32)
Creatinine, Ser: 1.38 mg/dL — ABNORMAL HIGH (ref 0.61–1.24)
GFR calc non Af Amer: 52 mL/min — ABNORMAL LOW (ref >60)
Glucose, Bld: 107 mg/dL — ABNORMAL HIGH (ref 65–99)
POTASSIUM: 3.9 mmol/L (ref 3.5–5.1)
Sodium: 128 mmol/L — ABNORMAL LOW (ref 135–145)

## 2015-04-03 NOTE — Progress Notes (Signed)
Patient ID: [name], male   DOB: 1951-03-01, 64 y.o.   MRN: [id number]  Cards: Dr. Antoine Poche  Subjective:   64 year old gentleman with a history of ischemic cardiomyopathy, CABG, recent pancreatitis, protein calorie malnutrition, posttraumatic stress disorder, coronary artery disease with prior coronary bypass grafting, refusal of AICD therapy, and recent confusion over medical therapy. Recent hypotension requiring removal of heart failure therapy. Last EF 40-45% 02/21/15. 25% by cath 03/29/15    Continued issues with abdominal pain.  Taking PO has had BM   Objective:  Vital Signs in the last 24 hours: Temp:  [97.5 F (36.4 C)-97.9 F (36.6 C)] 97.6 F (36.4 C) (06/11 0400) Pulse Rate:  [84-89] 89 (06/11 0600) Resp:  [14-26] 23 (06/11 0600) BP: (85-137)/(54-91) 137/80 mmHg (06/11 0600) SpO2:  [92 %-98 %] 98 % (06/11 0600) Weight:  [67.359 kg (148 lb 8 oz)] 67.359 kg (148 lb 8 oz) (06/11 0500)  Intake/Output from previous day: 06/10 0701 - 06/11 0700 In: 720 [P.O.:720] Out: 550 [Urine:550]   Physical Exam: General: Frail, in no acute distress. Head:  Normocephalic and atraumatic. Lungs: mild rales mid right lung. Heart: Normal S1 and S2.  Soft S murmur, no rubs or gallops.  Abdomen: soft, non-tender, positive bowel sounds. Extremities: No clubbing or cyanosis. No edema. Neurologic: Alert and oriented x 3. Soft spoken, no expressions    Lab Results:  Recent Labs  04/01/15 0242  WBC 8.9  HGB 8.2*  PLT 155     Telemetry:  NSR 04/03/2015   EKG: Sinus tachycardia with incomplete left bundle branch block. Other than sinus tachycardia and no change compared to 03/08/2015  Echo:; 02/21/15  EF 45%  Reviewed   25% by cath 03/29/15  [name]   Scheduled Meds: . antiseptic oral rinse  7 mL Mouth Rinse BID  . aspirin EC  81 mg Oral Daily  . divalproex  500 mg Oral TID  . furosemide  40 mg Oral Daily  . heparin  5,000 Units Subcutaneous 3 times per day  . lisinopril  2.5 mg  Oral Daily  . nitroGLYCERIN  0.5 inch Topical 4 times per day  . ranolazine  500 mg Oral BID  . sodium chloride  3 mL Intravenous Q12H   Continuous Infusions:   PRN Meds:.sodium chloride, sodium chloride, acetaminophen, nitroGLYCERIN, ondansetron (ZOFRAN) IV, sodium chloride, traMADol   Assessment/Plan:  Principal Problem:   Acute on chronic combined systolic and diastolic congestive heart failure, NYHA class 3 Active Problems:   S/P CABG (coronary artery bypass graft)   NSTEMI (non-ST elevated myocardial infarction)   AKI (acute kidney injury)   Acute respiratory failure with hypoxia   Hypotension   Acute on chronic combined systolic and diastolic congestive heart failure, NYHA class 4   Elevated troponin   Respiratory distress, acute   SOB (shortness of breath)   ASSESSMENT:  1. Acute on chronic combined systolic and diastolic heart failure:  Lungs clear  BP 80" systolic only had one dose of lisinopril 2.5 mg will hold today 2. Non-ST elevation myocardial infarction cath with no revascularizeable  lesions  3. Hypotension, hold ACE  4. Posttraumatic stress disorder 5. Acute kidney injury related to hypoperfusion - improved d/c dopamine  6. Abdominal aortic aneurysm 4.8 cm in diameter stable no abdominal pain palpable on exam  7. Seizure disorder 8. Anemia will guaic stools may need transfusion.    Plan: Continue with PT/OT OOB  Medical Rx CHF limited again by low BP  No  intervention planned for CAD  Inpatient rehab consult done and they indicated He has home health services in place and did not need inpatient rehab.  AAA is palpable but patient not distended and does not seem pained when abdomen Examined while sleeping will check KUB continue PRN zofran   [name] 04/03/2015, 8:18 AM

## 2015-04-04 ENCOUNTER — Inpatient Hospital Stay (HOSPITAL_COMMUNITY): Payer: Medicare Other

## 2015-04-04 DIAGNOSIS — L8990 Pressure ulcer of unspecified site, unspecified stage: Secondary | ICD-10-CM | POA: Insufficient documentation

## 2015-04-04 LAB — LIPASE, BLOOD: Lipase: 62 U/L — ABNORMAL HIGH (ref 22–51)

## 2015-04-04 LAB — HEPATIC FUNCTION PANEL
ALBUMIN: 2.1 g/dL — AB (ref 3.5–5.0)
ALK PHOS: 50 U/L (ref 38–126)
ALT: 6 U/L — ABNORMAL LOW (ref 17–63)
AST: 15 U/L (ref 15–41)
BILIRUBIN DIRECT: 0.3 mg/dL (ref 0.1–0.5)
BILIRUBIN INDIRECT: 0.7 mg/dL (ref 0.3–0.9)
BILIRUBIN TOTAL: 1 mg/dL (ref 0.3–1.2)
TOTAL PROTEIN: 6.1 g/dL — AB (ref 6.5–8.1)

## 2015-04-04 LAB — AMYLASE: Amylase: 125 U/L — ABNORMAL HIGH (ref 28–100)

## 2015-04-04 MED ORDER — IOHEXOL 300 MG/ML  SOLN
25.0000 mL | INTRAMUSCULAR | Status: AC
  Administered 2015-04-04: 25 mL via ORAL

## 2015-04-04 NOTE — Progress Notes (Signed)
Patient ID: [name], male   DOB: Aug 14, 1951, 64 y.o.   MRN: [id number]  Cards: Dr. Antoine Poche  Subjective:   64 year old gentleman with a history of ischemic cardiomyopathy, CABG, recent pancreatitis, protein calorie malnutrition, posttraumatic stress disorder, coronary artery disease with prior coronary bypass grafting, refusal of AICD therapy, and recent confusion over medical therapy. Recent hypotension requiring removal of heart failure therapy. Last EF 40-45% 02/21/15. 25% by cath 03/29/15    Continued issues with abdominal pain.  Taking PO has had BM with hiccups this am   Objective:  Vital Signs in the last 24 hours: Temp:  [97.6 F (36.4 C)-99.8 F (37.7 C)] 97.7 F (36.5 C) (06/12 0400) Pulse Rate:  [84-94] 89 (06/12 0600) Resp:  [16-28] 18 (06/12 0600) BP: (95-123)/(66-81) 110/78 mmHg (06/12 0600) SpO2:  [89 %-99 %] 98 % (06/12 0600) Weight:  [67.813 kg (149 lb 8 oz)] 67.813 kg (149 lb 8 oz) (06/12 0400)  Intake/Output from previous day:     Physical Exam: General: Frail, in no acute distress. Head:  Normocephalic and atraumatic. Lungs: mild rales mid right lung. Heart: Normal S1 and S2.  Soft S murmur, no rubs or gallops.  Abdomen: soft, non-tender, positive bowel sounds. Extremities: No clubbing or cyanosis. No edema. Neurologic: Alert and oriented x 3. Soft spoken, no expressions    Lab Results: No results for input(s): WBC, HGB, PLT in the last 72 hours.   Telemetry:  NSR 04/04/2015   EKG: Sinus tachycardia with incomplete left bundle branch block. Other than sinus tachycardia and no change compared to 03/08/2015  Echo:; 02/21/15  EF 45%  Reviewed   25% by cath 03/29/15  [name]   Scheduled Meds: . antiseptic oral rinse  7 mL Mouth Rinse BID  . aspirin EC  81 mg Oral Daily  . divalproex  500 mg Oral TID  . furosemide  40 mg Oral Daily  . heparin  5,000 Units Subcutaneous 3 times per day  . lisinopril  2.5 mg Oral Daily  . nitroGLYCERIN  0.5 inch Topical  4 times per day  . ranolazine  500 mg Oral BID  . sodium chloride  3 mL Intravenous Q12H   Continuous Infusions:   PRN Meds:.sodium chloride, sodium chloride, acetaminophen, nitroGLYCERIN, ondansetron (ZOFRAN) IV, sodium chloride, traMADol   Assessment/Plan:  Principal Problem:   Acute on chronic combined systolic and diastolic congestive heart failure, NYHA class 3 Active Problems:   S/P CABG (coronary artery bypass graft)   NSTEMI (non-ST elevated myocardial infarction)   AKI (acute kidney injury)   Acute respiratory failure with hypoxia   Hypotension   Acute on chronic combined systolic and diastolic congestive heart failure, NYHA class 4   Elevated troponin   Respiratory distress, acute   SOB (shortness of breath)   ASSESSMENT:  1. Acute on chronic combined systolic and diastolic heart failure:  Lungs clear  BP 80" systolic only had one dose of lisinopril 2.5 mg will hold today 2. Non-ST elevation myocardial infarction cath with no revascularizeable  lesions  3. Hypotension, hold ACE  4. Posttraumatic stress disorder 5. Acute kidney injury related to hypoperfusion - improved d/c dopamine  6. Abdominal aortic aneurysm 4.8 cm in diameter stable no abdominal pain palpable on exam  7. Seizure disorder 8. Anemia will guaic stools may need transfusion.    Plan: Continue with PT/OT OOB  Medical Rx CHF limited again by low BP  No intervention planned for CAD  Inpatient rehab consult done and  they indicated He has home health services in place and did not need inpatient rehab.  AAA is palpable but patient not distended and does not seem pained when abdomen Examined while sleeping KUB with nonobstructive gas pattern  US aorta ordered will ask IM/GI to help    [name] 04/04/2015, 7:37 AM

## 2015-04-04 NOTE — Consult Note (Signed)
Referring Provider: Dr. Eden Emms Primary Care Physician:  Gildardo Cranker, DO Primary Gastroenterologist:  Gentry Fitz  Reason for Consultation:  Abdominal pain; Nausea and vomiting  HPI: [name] is a 64 y.o. male being seen for a consult due to abdominal pain, N/V. He is unable to give me any history regarding his symptoms other than saying he has been having diffuse abdominal pain. He reports having pancreatitis 2 months ago and denies previous episodes of pancreatitis. He is having persistent abdominal pain and recurrent hiccups and dry heaving. He is holding a green emesis bag with a small amount of vomitus in it. Recent cath showing EF of 25%. Denies alcohol. Lipase 106.   Past Medical History  Diagnosis Date  . Seizure disorder   . Nausea   . History of heartburn   . Chronic back pain   . IHD (ischemic heart disease)   . Cigarette smoker   . Hypercholesterolemia   . CHF (congestive heart failure)     EF had been 20% initially. 2010 EF 35-40%.  . Traumatic brain injury   . Seizures   . COPD (chronic obstructive pulmonary disease)   . Pancreatitis     Past Surgical History  Procedure Laterality Date  . Cardiac catheterization  03/12/2007  . Coronary artery bypass graft  2000    x6, graft to the PDA and LAD, patent saphenous vein graft to acute marginal and posterior lateral severely diseased saphenous vein graft to diagonal. Patent LIMA to the LAD.  Marland Kitchen Shoulder surgery      right  . Facial reconstruction surgery    . Back surgery    . Cardiac catheterization N/A 03/29/2015    Procedure: Left Heart Cath and Cors/Grafts Angiography;  Surgeon: Marykay Lex, MD;  Location: [facility] INVASIVE CV LAB;  Service: Cardiovascular;  Laterality: N/A;    Prior to Admission medications   Medication Sig Start Date End Date Taking? Authorizing Provider  aspirin 325 MG tablet Take 325 mg by mouth daily.     Yes Historical Provider, MD  divalproex (DEPAKOTE) 500 MG EC tablet Take 500 mg by  mouth 3 (three) times daily. Prescribed by  his neurologist in [facility]   Yes Historical Provider, MD  furosemide (LASIX) 40 MG tablet TAKE ONE TABLET BY MOUTH EVERY OTHER DAY   Yes Rollene Rotunda, MD  liver oil-zinc oxide (DESITIN) 40 % ointment Apply topically every morning. 03/10/15  Yes Yolande Jolly, MD  pravastatin (PRAVACHOL) 40 MG tablet Take 1 tablet (40 mg total) by mouth daily. 05/14/14  Yes Rollene Rotunda, MD  ranitidine (ZANTAC) 150 MG capsule Take 300 mg by mouth daily.    Yes Historical Provider, MD    Scheduled Meds: . antiseptic oral rinse  7 mL Mouth Rinse BID  . aspirin EC  81 mg Oral Daily  . divalproex  500 mg Oral TID  . furosemide  40 mg Oral Daily  . heparin  5,000 Units Subcutaneous 3 times per day  . lisinopril  2.5 mg Oral Daily  . nitroGLYCERIN  0.5 inch Topical 4 times per day  . ranolazine  500 mg Oral BID  . sodium chloride  3 mL Intravenous Q12H   Continuous Infusions:  PRN Meds:.sodium chloride, sodium chloride, acetaminophen, nitroGLYCERIN, ondansetron (ZOFRAN) IV, sodium chloride, traMADol  Allergies as of 03/26/2015 - Review Complete 03/26/2015  Allergen Reaction Noted  . Aspirin  03/27/2012  . Flomax [tamsulosin hcl] Swelling 03/05/2015  . Morphine and related Other (See Comments)  02/20/2015    Family History  Problem Relation Age of Onset  . Heart disease Brother   . Hypertension Brother   . Heart attack Brother   . Heart disease Sister   . Coronary artery disease Brother   . Hypertension Brother   . Heart attack Brother   . Coronary artery disease Sister   . Diabetes Brother   . Thyroid disease Brother   . Heart attack Brother   . Diabetes Father   . Heart disease Father   . Hypertension Father   . Heart attack Father   . Hypertension Mother   . Varicose Veins Mother     History   Social History  . Marital Status: Married    Spouse Name: N/A  . Number of Children: N/A  . Years of Education: N/A   Occupational History   . Not on file.   Social History Main Topics  . Smoking status: Current Every Day Smoker -- 0.75 packs/day for 50 years    Types: Cigarettes  . Smokeless tobacco: Never Used  . Alcohol Use: No  . Drug Use: No  . Sexual Activity: Not on file   Other Topics Concern  . Not on file   Social History Narrative    Review of Systems: All negative except as stated above in HPI.  Physical Exam: Vital signs: Filed Vitals:   04/04/15 0800  BP: 105/75  Pulse: 87  Temp: 98.1 F (36.7 C)  Resp: 23     General:   Lethargic, thin, mild acute distress, disheveled Head: atraumatic Eyes: anicteric sclera ENT: poor dentition Neck: nontender Lungs:  Coarse breath sounds Heart:  Regular rate and rhythm; no murmurs, clicks, rubs,  or gallops. Abdomen: diffusely tender with guarding, soft, nondistended, +BS  Rectal:  Deferred Ext: pulses intact, no edema Skin: no rash  GI:  Lab Results: No results for input(s): WBC, HGB, HCT, PLT in the last 72 hours. BMET  Recent Labs  04/03/15 1019  NA 128*  K 3.9  CL 85*  CO2 31  GLUCOSE 107*  BUN 22*  CREATININE 1.38*  CALCIUM 8.6*   LFT No results for input(s): PROT, ALBUMIN, AST, ALT, ALKPHOS, BILITOT, BILIDIR, IBILI in the last 72 hours. PT/INR No results for input(s): LABPROT, INR in the last 72 hours.   Studies/Results: Dg Abd Portable 1v  04/03/2015   CLINICAL DATA:  Periumbilical pain, emesis, and hit steps for 3-4 days.  EXAM: PORTABLE ABDOMEN - 1 VIEW  COMPARISON:  CT abdomen and pelvis 02/20/2015  FINDINGS: Gas is present in multiple loops of small and large bowel to the level of the rectum. No dilated loops of bowel are seen to suggest obstruction. There is a small to moderate amount of stool throughout the colon. Vascular calcifications are noted. Sternotomy wires and surgical clips are partially visualized in the lower chest. No acute osseous abnormality is identified.  IMPRESSION: Nonobstructed bowel-gas pattern.    Electronically Signed   By: Sebastian Ache   On: 04/03/2015 09:35    Impression/Plan: 64 yo with severe CHF seen for recurrent N/V/abdominal pain in the setting of pancreatitis 2 months ago. Lipase 106 and agree with recheck of lipase today of 62 and LFTs normal. Will do a noncontrast abd/pelvis CT to look for small bowel obstruction or pancreatitis. If unrevealing, then may need an EGD to look for peptic ulcer disease or gastric outlet obstruction. Dr. Randa Evens to f/u tomorrow.    LOS: 9 days   Bertram Haddix C.  04/04/2015, 12:48 PM  Pager [phone]  If no answer or after 5 PM call [phone]

## 2015-04-05 ENCOUNTER — Inpatient Hospital Stay (HOSPITAL_COMMUNITY): Payer: Medicare Other

## 2015-04-05 ENCOUNTER — Encounter (HOSPITAL_COMMUNITY)
Admission: EM | Disposition: A | Discharge status: Nursing Home (Includes ICF) | Payer: Self-pay | Source: Home / Self Care | Attending: Cardiology

## 2015-04-05 DIAGNOSIS — R579 Shock, unspecified: Secondary | ICD-10-CM

## 2015-04-05 DIAGNOSIS — K861 Other chronic pancreatitis: Secondary | ICD-10-CM

## 2015-04-05 DIAGNOSIS — J9601 Acute respiratory failure with hypoxia: Secondary | ICD-10-CM

## 2015-04-05 LAB — CBC
HCT: 31.5 % — ABNORMAL LOW (ref 39.0–52.0)
HEMOGLOBIN: 10.7 g/dL — AB (ref 13.0–17.0)
MCH: 33.8 pg (ref 26.0–34.0)
MCHC: 34 g/dL (ref 30.0–36.0)
MCV: 99.4 fL (ref 78.0–100.0)
PLATELETS: 194 10*3/uL (ref 150–400)
RBC: 3.17 MIL/uL — ABNORMAL LOW (ref 4.22–5.81)
RDW: 15.3 % (ref 11.5–15.5)
WBC: 14.6 10*3/uL — ABNORMAL HIGH (ref 4.0–10.5)

## 2015-04-05 LAB — TROPONIN I: TROPONIN I: 0.05 ng/mL — AB (ref <0.031)

## 2015-04-05 LAB — URINALYSIS, ROUTINE W REFLEX MICROSCOPIC
Glucose, UA: NEGATIVE mg/dL
Hgb urine dipstick: NEGATIVE
Ketones, ur: NEGATIVE mg/dL
NITRITE: NEGATIVE
PH: 5 (ref 5.0–8.0)
Protein, ur: 30 mg/dL — AB
SPECIFIC GRAVITY, URINE: 1.02 (ref 1.005–1.030)
UROBILINOGEN UA: 1 mg/dL (ref 0.0–1.0)

## 2015-04-05 LAB — GLUCOSE, CAPILLARY
GLUCOSE-CAPILLARY: 103 mg/dL — AB (ref 65–99)
Glucose-Capillary: 94 mg/dL (ref 65–99)

## 2015-04-05 LAB — URINE MICROSCOPIC-ADD ON

## 2015-04-05 LAB — BASIC METABOLIC PANEL
Anion gap: 10 (ref 5–15)
BUN: 33 mg/dL — ABNORMAL HIGH (ref 6–20)
CALCIUM: 7.9 mg/dL — AB (ref 8.9–10.3)
CO2: 32 mmol/L (ref 22–32)
Chloride: 88 mmol/L — ABNORMAL LOW (ref 101–111)
Creatinine, Ser: 1.66 mg/dL — ABNORMAL HIGH (ref 0.61–1.24)
GFR calc Af Amer: 48 mL/min — ABNORMAL LOW (ref >60)
GFR calc non Af Amer: 42 mL/min — ABNORMAL LOW (ref >60)
GLUCOSE: 96 mg/dL (ref 65–99)
Potassium: 3.7 mmol/L (ref 3.5–5.1)
SODIUM: 130 mmol/L — AB (ref 135–145)

## 2015-04-05 LAB — LACTIC ACID, PLASMA: LACTIC ACID, VENOUS: 2.4 mmol/L — AB (ref 0.5–2.0)

## 2015-04-05 SURGERY — EGD (ESOPHAGOGASTRODUODENOSCOPY)
Anesthesia: Monitor Anesthesia Care

## 2015-04-05 MED ORDER — PANTOPRAZOLE SODIUM 40 MG IV SOLR
40.0000 mg | INTRAVENOUS | Status: DC
  Administered 2015-04-05: 40 mg via INTRAVENOUS
  Filled 2015-04-05 (×2): qty 40

## 2015-04-05 MED ORDER — DEXTROSE-NACL 5-0.9 % IV SOLN
INTRAVENOUS | Status: DC
  Administered 2015-04-05 – 2015-04-06 (×2): via INTRAVENOUS

## 2015-04-05 MED ORDER — SODIUM CHLORIDE 0.9 % IV SOLN
INTRAVENOUS | Status: DC
  Administered 2015-04-05: 06:00:00 via INTRAVENOUS
  Administered 2015-04-05: 50 mL/h via INTRAVENOUS

## 2015-04-05 MED ORDER — DOPAMINE-DEXTROSE 3.2-5 MG/ML-% IV SOLN
2.5000 ug/kg/min | INTRAVENOUS | Status: DC
  Administered 2015-04-05: 2.5 ug/kg/min via INTRAVENOUS
  Filled 2015-04-05: qty 250

## 2015-04-05 MED ORDER — SODIUM CHLORIDE 0.9 % IV BOLUS (SEPSIS)
500.0000 mL | Freq: Once | INTRAVENOUS | Status: AC
  Administered 2015-04-05: 500 mL via INTRAVENOUS

## 2015-04-05 MED ORDER — VANCOMYCIN HCL 500 MG IV SOLR
500.0000 mg | Freq: Two times a day (BID) | INTRAVENOUS | Status: DC
  Filled 2015-04-05: qty 500

## 2015-04-05 MED ORDER — VANCOMYCIN HCL IN DEXTROSE 1-5 GM/200ML-% IV SOLN
1000.0000 mg | Freq: Once | INTRAVENOUS | Status: AC
  Administered 2015-04-05: 1000 mg via INTRAVENOUS
  Filled 2015-04-05: qty 200

## 2015-04-05 MED ORDER — PIPERACILLIN-TAZOBACTAM 3.375 G IVPB
3.3750 g | Freq: Three times a day (TID) | INTRAVENOUS | Status: DC
  Administered 2015-04-05 – 2015-04-09 (×14): 3.375 g via INTRAVENOUS
  Filled 2015-04-05 (×17): qty 50

## 2015-04-05 MED ORDER — VALPROATE SODIUM 500 MG/5ML IV SOLN: 500.0000 mg | Freq: Three times a day (TID) | INTRAVENOUS | Status: DC

## 2015-04-05 MED ORDER — SODIUM CHLORIDE 0.9 % IV SOLN
500.0000 mg | Freq: Two times a day (BID) | INTRAVENOUS | Status: DC
  Administered 2015-04-05 – 2015-04-08 (×8): 500 mg via INTRAVENOUS
  Filled 2015-04-05 (×11): qty 5

## 2015-04-05 NOTE — Progress Notes (Signed)
Patient: [name] / Admit Date: 03/26/2015 / Date of Encounter: 04/05/2015, 2:42 PM   Subjective: Lethargic. Not really able to answer my questions but he attempts to do so with mumbling.   Objective: Telemetry: NSR/ST with occasional PVCs Physical Exam: Blood pressure 84/54, pulse 97, temperature 99.1 F (37.3 C), temperature source Oral, resp. rate 25, height 6' (1.829 m), weight 155 lb 13.8 oz (70.7 kg), SpO2 90 %. General: Well developed, well nourished, in no acute distress. Head: Normocephalic, atraumatic, sclera non-icteric, no xanthomas, nares are without discharge. Neck:  JVP not elevated. Lungs: Clear bilaterally to auscultation without wheezes, rales, or rhonchi. Breathing is unlabored. Heart: Reg rhythm, mildly elevated, S1 S2 without murmurs, rubs, or gallops.  Abdomen: Soft, non-tender, non-distended with normoactive bowel sounds. No rebound/guarding. Extremities: No clubbing or cyanosis. No edema. Distal pedal pulses are 2+ and equal bilaterally. Neuro: Sleepy but arousable. Attempts to follow simple commands. Mumbles.   Intake/Output Summary (Last 24 hours) at 04/05/15 1442 Last data filed at 04/05/15 1400  Gross per 24 hour  Intake  802.8 ml  Output    860 ml  Net  -57.2 ml    Inpatient Medications:  . antiseptic oral rinse  7 mL Mouth Rinse BID  . aspirin EC  81 mg Oral Daily  . heparin  5,000 Units Subcutaneous 3 times per day  . levETIRAcetam  500 mg Intravenous Q12H  . pantoprazole (PROTONIX) IV  40 mg Intravenous Q24H  . piperacillin-tazobactam (ZOSYN)  IV  3.375 g Intravenous 3 times per day  . ranolazine  500 mg Oral BID  . sodium chloride  3 mL Intravenous Q12H   Infusions:  . dextrose 5 % and 0.9% NaCl 50 mL/hr at 04/05/15 1038  . DOPamine 5 mcg/kg/min (04/05/15 1400)    Labs:  Recent Labs  04/03/15 1019 04/05/15 0536  NA 128* 130*  K 3.9 3.7  CL 85* 88*  CO2 31 32  GLUCOSE 107* 96  BUN 22* 33*  CREATININE 1.38* 1.66*  CALCIUM  8.6* 7.9*    Recent Labs  04/04/15 1222  AST 15  ALT 6*  ALKPHOS 50  BILITOT 1.0  PROT 6.1*  ALBUMIN 2.1*    Recent Labs  04/05/15 0536  WBC 14.6*  HGB 10.7*  HCT 31.5*  MCV 99.4  PLT 194    Recent Labs  04/05/15 1125  TROPONINI 0.05*   Invalid input(s): POCBNP No results for input(s): HGBA1C in the last 72 hours.   Radiology/Studies:  Ct Abdomen Pelvis Wo Contrast  04/05/2015   CLINICAL DATA:  Acute onset of generalized abdominal pain, nausea and vomiting. Initial encounter.  EXAM: CT ABDOMEN AND PELVIS WITHOUT CONTRAST  TECHNIQUE: Multidetector CT imaging of the abdomen and pelvis was performed following the standard protocol without IV contrast.  COMPARISON:  CT of the abdomen and pelvis performed 02/20/2015  FINDINGS: Small bilateral pleural effusions are noted, with partial consolidation of both lower lung lobes. Underlying pneumonia cannot be excluded. Diffuse coronary artery calcifications are seen. Trace pericardial fluid remains within normal limits.  There is an apparent 5.1 x 1.2 cm pseudocyst extending along the body of the pancreas, increased in size from the prior study. Diffuse surrounding soft tissue edema and fluid are seen, with new large pseudocysts compressing the adjacent liver. These measure approximately 12.4 x 4.7 cm anterior to the left hepatic lobe, and 12.2 x 5.6 cm along the inferior aspect of the hepatic hilum, extending adjacent to the gallbladder fossa.  There is vague diffuse soft tissue edema involving the omentum, with associated prominence of the underlying vasculature. Free fluid is seen tracking along the paracolic gutters and into the pelvis. Soft tissue inflammation tracks about the descending colon, without evidence of significant wall thickening or diverticula. This raises concern for some degree of underlying peritonitis.  A somewhat unusual low-attenuation focus is noted at the caudate lobe, measuring approximately 4.4 x 3.7 cm. This raises  concern for an underlying hepatic abscess, though this is not well assessed without contrast. The spleen is unremarkable in appearance.  The gallbladder is grossly unremarkable in appearance. Scattered calcifications throughout the pancreas likely reflect sequelae of chronic pancreatitis. The adrenal glands are grossly unremarkable in appearance.  There is severe left renal atrophy. Nonspecific perinephric stranding is noted bilaterally. The right kidney is otherwise unremarkable. There is no evidence of hydronephrosis. No renal or ureteral stones are seen.  The small bowel is unremarkable in appearance. The stomach is within normal limits. No acute vascular abnormalities are seen. The abdominal aorta is dilated up to 4.8 cm in maximal AP dimension, relatively stable from prior studies. 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 These were seen to be filled with thrombus on the last contrast-enhanced study, though some are mildly more prominent than on the prior study.  The appendix is normal in caliber, without evidence of appendicitis. Aside from the inflammation along the descending colon as described above, the colon is grossly unremarkable in appearance.  The bladder is mildly distended and grossly unremarkable. The prostate remains normal in size. No inguinal lymphadenopathy is seen.  No acute osseous abnormalities are identified. Disc space narrowing is noted at L3-L4.  IMPRESSION: 1. Vague diffuse soft tissue edema involving the omentum, with associated prominence of the underlying vasculature. Free fluid tracks along the paracolic gutters into the pelvis. Soft tissue inflammation tracks along the descending colon, without evidence of significant wall thickening or diverticulum. These findings are all new from the prior study. Given the patient's significantly worsened pancreatitis, this raises concern for some degree of  underlying peritonitis. 2. The patient's pancreatitis has markedly worsened since the prior study, with an apparent 5.1 x 1.2 cm pseudocyst extending along the body of the pancreas. Diffuse surrounding soft tissue edema and fluid noted, with new large pseudocysts seen compressing the adjacent liver. These measure approximately 12.4 x 4.7 cm anterior to the left hepatic lobe, and 12.2 x 5.6 cm along the inferior aspect of the hepatic hilum, extending adjacent to the gallbladder fossa. Underlying diffuse calcifications within the pancreas reflect sequelae of chronic pancreatitis. 3. Somewhat unusual low-attenuation focus at the caudate lobe of the liver, measuring 4.4 x 3.7 cm and new from the prior study. This is concerning for a hepatic abscess, though not well assessed without contrast. Hepatic abscesses have been known to occur as a sequelae of chronic pancreatitis, and amoebic liver abscess has also been known to occur in conjunction with acute pancreatitis. 4. New small bilateral pleural effusions, with partial consolidation of both lower lung lobes. Pneumonia cannot be excluded, though this likely reflects the underlying pancreatitis. 5. Abdominal aorta dilated up to 4.8 cm in maximal AP dimension, relatively stable from prior studies. 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 These were seen to be filled with thrombus on the last contrast-enhanced study, though some are mildly more prominent than on the prior study.  6. Diffuse coronary artery calcifications seen.  These results were called by telephone at the time of interpretation on 04/05/2015 at 2:34 am to Dr. Benay Pillow, who verbally acknowledged these results.   Electronically Signed   By: Roanna Raider M.D.   On: 04/05/2015 02:37   Dg Chest 2 View  03/07/2015   CLINICAL DATA:  Fatigue and altered mental status today.  EXAM: CHEST  2 VIEW  COMPARISON:  03/05/2015 and  02/20/2015  FINDINGS: Sternotomy wires unchanged. Lungs are hypoinflated with worsening left base opacification likely small to moderate effusion with associated atelectasis although cannot exclude infection in the left base. Cardiomediastinal silhouette and remainder of the exam is unchanged.  IMPRESSION: Worsening left base opacification likely small to moderate effusion with associated atelectasis as cannot exclude infection in the left base.   Electronically Signed   By: Elberta Fortis M.D.   On: 03/07/2015 16:38   Dg Chest Port 1 View  04/05/2015   CLINICAL DATA:  Central line placement.  EXAM: PORTABLE CHEST - 1 VIEW  COMPARISON:  03/26/2015  FINDINGS: Postoperative changes in the mediastinum. Interval placement of a right central venous catheter with tip over the cavoatrial junction. No pneumothorax. Cardiac enlargement. Mild pulmonary vascular congestion centrally. Linear atelectasis or infiltration in the left lung base with probable small pleural effusion, increasing since previous study. No consolidation. Calcification of the aorta. Surgical clips in the upper abdomen peer  IMPRESSION: Increasing effusion and atelectasis in the left base. Cardiac enlargement with mild vascular congestion. No edema. Right central venous catheter appears in satisfactory location.   Electronically Signed   By: Burman Nieves M.D.   On: 04/05/2015 06:10   Dg Chest Port 1 View  03/26/2015   CLINICAL DATA:  Acute onset of shortness of breath. Initial encounter.  EXAM: PORTABLE CHEST - 1 VIEW  COMPARISON:  Chest radiograph performed 03/07/2015  FINDINGS: The lungs are well-aerated. Vascular congestion is noted. Mild bibasilar opacities raise concern for mild interstitial edema. There is no evidence of pleural effusion or pneumothorax.  The cardiomediastinal silhouette is borderline normal in size. The patient is status post median sternotomy, with evidence of prior CABG. No acute osseous abnormalities are seen.   IMPRESSION: Vascular congestion noted. Mild bibasilar opacities raise concern for mild interstitial edema.   Electronically Signed   By: Roanna Raider M.D.   On: 03/26/2015 02:56   Dg Abd Portable 1v  04/03/2015   CLINICAL DATA:  Periumbilical pain, emesis, and hit steps for 3-4 days.  EXAM: PORTABLE ABDOMEN - 1 VIEW  COMPARISON:  CT abdomen and pelvis 02/20/2015  FINDINGS: Gas is present in multiple loops of small and large bowel to the level of the rectum. No dilated loops of bowel are seen to suggest obstruction. There is a small to moderate amount of stool throughout the colon. Vascular calcifications are noted. Sternotomy wires and surgical clips are partially visualized in the lower chest. No acute osseous abnormality is identified.  IMPRESSION: Nonobstructed bowel-gas pattern.   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 He had 3 recent admissions for pancreatitis, AMS, and PNA/UTI. Hypoxic and hypotensive on arrival with increased LEE, treated for acute on chronic CHF. Aslo required IV dopamine for renal perfusion. Cath 02/26/15 with severe ICM EF  15%, diffuse disease but no PCI options that would make sense to improve cardiac output - had moderate disease in the vein graft to the RCA system likely not flow-limiting. PCI on this vessel would be high risk given his low EF and vein graft being one of 2 remaining conduits. Over the weekend developed stomach upset, anorexia and hiccups. CT abd showed signs of pancreatitis with a large pseudocyst, peritonitis, colitis, and 4 cm liver abscess. Also with protein calorie malnutrition, hyopnatremia.  1. Acute on chronic systolic CHF 2. Persistent hypotension, possibly due to cardiogenic +/- septic +/- hypovolemic shock 3. Acute pancreatitis  superimposed on recent episode with pseudocysts, liver abscess and peritonitis 4. Acute kidney injury 5. Mild anemia without acute blood loss 6. Seizure d/o - VPA changed to levetiracetam by PCCM for concern for pancreatitis contributor 7. NSTEMI earlier this admission/CAD with no revascularizable lesions suitable for PCI 8. Aortic ulcerations, not currently a good anticoag candidate due to ongoing GI issues  Quite ill with complications of pancreatitis. GI feels he will likely need drainage when abscess are more mature. All HF therapies on hold due to hypotension. Now on broad spectrum abx. Received wide open IVF overnight thus will follow volume status. Appreciate PCCM input regarding pressor therapy to keep MAP >65. Discussed with Dr. Gala Romney and Dr. Tresa Endo who agree to continue current course. He was tachycardic and hypotensive earlier but HR and BP have improved (HR now ~100, BP 99/61). The patient missed ASA this AM due to being lethargic. For now will follow mental status but if he remains unable to take orals in AM, would change to rectal ASA. F/u ammonia and LFTs ordered for AM. 2D echo ordered to further define EF since it was so markedly different from recent outpatient EF.   Signed, Ronie Spies PA-C Pager: [phone]  Patient seen and examined. Agree with assessment and plan. Records reviewed. Pt somnolent. Currently on dopamine for pressor support. Less tachycardiac and BP now ~100. Will re-assess LFT's, and obtain ammonia level in am.  Re-assess LV function to determine if there has been some improvement from initial 03/29/15 Ef determined at cath.   Lennette Bihari, MD, [facility] 04/05/2015 7:04 PM

## 2015-04-05 NOTE — Progress Notes (Signed)
[facility] PROGRESS NOTE Subjective CT results noted worsening pancreatitis with developing pseudocysts and ? Of hepatic abscess and markeded increase in intra-abdominal fluid.  Objective: Vital signs in last 24 hours: Temp:  [97.6 F (36.4 C)-99.1 F (37.3 C)] 98.7 F (37.1 C) (06/13 0845) Pulse Rate:  [34-115] 99 (06/13 0615) Resp:  [14-28] 19 (06/13 0845) BP: (62-116)/(45-79) 95/67 mmHg (06/13 0845) SpO2:  [76 %-100 %] 97 % (06/13 0800) Weight:  [67.813 kg (149 lb 8 oz)-70.7 kg (155 lb 13.8 oz)] 70.7 kg (155 lb 13.8 oz) (06/13 0650)    Intake/Output from previous day: 06/12 0701 - 06/13 0700 In: 335.2 [P.O.:260; I.V.:62.7; IV Piggyback:12.5] Out: 250 [Urine:175; Emesis/NG output:75] Intake/Output this shift: Total I/O In: 72 [I.V.:59.5; IV Piggyback:12.5] Out: 250 [Emesis/NG output:250]  PE: General--pt heavily sedated  Lungs--coarse BSs Abdomen--tender quiet  Lab Results:  Recent Labs  04/05/15 0536  WBC 14.6*  HGB 10.7*  HCT 31.5*  PLT 194   BMET  Recent Labs  04/03/15 1019 04/05/15 0536  NA 128* 130*  K 3.9 3.7  CL 85* 88*  CO2 31 32  CREATININE 1.38* 1.66*   LFT  Recent Labs  04/04/15 1222  PROT 6.1*  AST 15  ALT 6*  ALKPHOS 50  BILITOT 1.0  BILIDIR 0.3  IBILI 0.7   PT/INR No results for input(s): LABPROT, INR in the last 72 hours. PANCREAS  Recent Labs  04/02/15 1713 04/04/15 1222  LIPASE 106* 62*         Studies/Results: Ct Abdomen Pelvis Wo Contrast  04/05/2015   CLINICAL DATA:  Acute onset of generalized abdominal pain, nausea and vomiting. Initial encounter.  EXAM: CT ABDOMEN AND PELVIS WITHOUT CONTRAST  TECHNIQUE: Multidetector CT imaging of the abdomen and pelvis was performed following the standard protocol without IV contrast.  COMPARISON:  CT of the abdomen and pelvis performed 02/20/2015  FINDINGS: Small bilateral pleural effusions are noted, with partial consolidation of both lower lung lobes.  Underlying pneumonia cannot be excluded. Diffuse coronary artery calcifications are seen. Trace pericardial fluid remains within normal limits.  There is an apparent 5.1 x 1.2 cm pseudocyst extending along the body of the pancreas, increased in size from the prior study. Diffuse surrounding soft tissue edema and fluid are seen, with new large pseudocysts compressing the adjacent liver. These measure approximately 12.4 x 4.7 cm anterior to the left hepatic lobe, and 12.2 x 5.6 cm along the inferior aspect of the hepatic hilum, extending adjacent to the gallbladder fossa.  There is vague diffuse soft tissue edema involving the omentum, with associated prominence of the underlying vasculature. Free fluid is seen tracking along the paracolic gutters and into the pelvis. Soft tissue inflammation tracks about the descending colon, without evidence of significant wall thickening or diverticula. This raises concern for some degree of underlying peritonitis.  A somewhat unusual low-attenuation focus is noted at the caudate lobe, measuring approximately 4.4 x 3.7 cm. This raises concern for an underlying hepatic abscess, though this is not well assessed without contrast. The spleen is unremarkable in appearance.  The gallbladder is grossly unremarkable in appearance. Scattered calcifications throughout the pancreas likely reflect sequelae of chronic pancreatitis. The adrenal glands are grossly unremarkable in appearance.  There is severe left renal atrophy. Nonspecific perinephric stranding is noted bilaterally. The right kidney is otherwise unremarkable. There is no evidence of hydronephrosis. No renal or ureteral stones are seen.  The small bowel is unremarkable in appearance. The stomach is within normal limits. No  acute vascular abnormalities are seen. The abdominal aorta is dilated up to 4.8 cm in maximal AP dimension, relatively stable from prior studies. 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 These were seen to be filled with thrombus on the last contrast-enhanced study, though some are mildly more prominent than on the prior study.  The appendix is normal in caliber, without evidence of appendicitis. Aside from the inflammation along the descending colon as described above, the colon is grossly unremarkable in appearance.  The bladder is mildly distended and grossly unremarkable. The prostate remains normal in size. No inguinal lymphadenopathy is seen.  No acute osseous abnormalities are identified. Disc space narrowing is noted at L3-L4.  IMPRESSION: 1. Vague diffuse soft tissue edema involving the omentum, with associated prominence of the underlying vasculature. Free fluid tracks along the paracolic gutters into the pelvis. Soft tissue inflammation tracks along the descending colon, without evidence of significant wall thickening or diverticulum. These findings are all new from the prior study. Given the patient's significantly worsened pancreatitis, this raises concern for some degree of underlying peritonitis. 2. The patient's pancreatitis has markedly worsened since the prior study, with an apparent 5.1 x 1.2 cm pseudocyst extending along the body of the pancreas. Diffuse surrounding soft tissue edema and fluid noted, with new large pseudocysts seen compressing the adjacent liver. These measure approximately 12.4 x 4.7 cm anterior to the left hepatic lobe, and 12.2 x 5.6 cm along the inferior aspect of the hepatic hilum, extending adjacent to the gallbladder fossa. Underlying diffuse calcifications within the pancreas reflect sequelae of chronic pancreatitis. 3. Somewhat unusual low-attenuation focus at the caudate lobe of the liver, measuring 4.4 x 3.7 cm and new from the prior study. This is concerning for a hepatic abscess, though not well assessed without contrast. Hepatic abscesses have been known to occur as a sequelae of  chronic pancreatitis, and amoebic liver abscess has also been known to occur in conjunction with acute pancreatitis. 4. New small bilateral pleural effusions, with partial consolidation of both lower lung lobes. Pneumonia cannot be excluded, though this likely reflects the underlying pancreatitis. 5. Abdominal aorta dilated up to 4.8 cm in maximal AP dimension, relatively stable from prior studies. 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 These were seen to be filled with thrombus on the last contrast-enhanced study, though some are mildly more prominent than on the prior study. 6. Diffuse coronary artery calcifications seen.  These results were called by telephone at the time of interpretation on 04/05/2015 at 2:34 am to Dr. Benay Pillow, who verbally acknowledged these results.   Electronically Signed   By: Roanna Raider M.D.   On: 04/05/2015 02:37   Dg Chest Port 1 View  04/05/2015   CLINICAL DATA:  Central line placement.  EXAM: PORTABLE CHEST - 1 VIEW  COMPARISON:  03/26/2015  FINDINGS: Postoperative changes in the mediastinum. Interval placement of a right central venous catheter with tip over the cavoatrial junction. No pneumothorax. Cardiac enlargement. Mild pulmonary vascular congestion centrally. Linear atelectasis or infiltration in the left lung base with probable small pleural effusion, increasing since previous study. No consolidation. Calcification of the aorta. Surgical clips in the upper abdomen peer  IMPRESSION: Increasing effusion and atelectasis in the left base. Cardiac enlargement with mild vascular congestion. No edema. Right central venous catheter appears in satisfactory location.   Electronically Signed   By:  Burman Nieves M.D.   On: 04/05/2015 06:10   Dg Abd Portable 1v  04/03/2015   CLINICAL DATA:  Periumbilical pain, emesis, and hit steps for 3-4 days.  EXAM: PORTABLE ABDOMEN - 1 VIEW  COMPARISON:   CT abdomen and pelvis 02/20/2015  FINDINGS: Gas is present in multiple loops of small and large bowel to the level of the rectum. No dilated loops of bowel are seen to suggest obstruction. There is a small to moderate amount of stool throughout the colon. Vascular calcifications are noted. Sternotomy wires and surgical clips are partially visualized in the lower chest. No acute osseous abnormality is identified.  IMPRESSION: Nonobstructed bowel-gas pattern.   Electronically Signed   By: Sebastian Ache   On: 04/03/2015 09:35    Medications: I have reviewed the patient's current medications.  Assessment/Plan: 1. Acute Pancreatitis. Complicated by  Evolving pseudocyst and probable abdominal and ? Hepatic abcesses. Pt quite ill with multiple complications of pancreatitis, continue aggressive BS antibiotic therapy and keep well hydrated watch Hct and creat, follow abd CT and will likely need drainage when abscesses more mature.   Jasmin Trumbull JR,Dailey Alberson L 04/05/2015, 9:07 AM  Pager: [phone] If no answer or after hours call [phone]

## 2015-04-05 NOTE — Progress Notes (Addendum)
Called by radiology regarding results of abdominal CT.  There are signs of pancreatitis with a large pseudocyst, peritonitis, colitis, and 4 cm liver abscess.  On exam pt is hypotensive to 77/55, HR 94 in sinus.  He appears dry, JVP is flat.  Per nursing, he has been vomiting frequently throughout the day an unable to keep any clears down.  His abdomen is soft without rebound tenderness.  bs hypoactive  Plan - 500 cc bolus then 50 cc/hr.  Hold am lasix. - start low dose peripheral dopa to keep a MAP > 65, if responds with bolus, titrate down - make NPO  Dossie Arbour, MD Cardiology Fellow  Addendum: - Called by nursing for SBP of 64/46 despite fluid resuscitation and peripheral dopa at 5. - Concern for patient developing septic shock - fluids ruin wide open for fluid rescuscitation - decision to place emergent central line for invasive CVP monitoring and allow for optimal pressor support - Blood cultures times 2 ordered - CBC, BNP, lactate - Vanc/Zosyn ordered to be given after blood cultures drawn - CXR ordered to confirm central line placement - Titrate dopamine to maintain a MAP > 65 - Transfer to ICU.  Intensive care team alerted.  Discussed the risks and benefits of central line placement with [name].  I explained that there is risk for bleeding, pneumothorax, infection, carotid cannulation, death.  He agreed to procedure and attempted to sign consent but too weak to do so legibly.  Nurse in room and observed conversation and attempt at signature.  Due to hypotension, line placed for emergent care in a critically ill patient.  Using sterile technique, central line placed under ultrasound guidance in R IJ.  R IJ small and collapsible consistent with low RA pressure.  Wire observed in long axis within the R IJ.  Vein dilated, and central line placed.  All three ports drew back dark venous blood and flushed with saline.  Line sutured in place.  CXR ordered to confirm  placement.   Dossie Arbour, MD  Addendum:  Central line in SVC, no PTX by my review of CXR, formal read pending.  Ok to use central line.  I spoke with [name] wife and updated her on his condition as well as need for emergent central line.

## 2015-04-05 NOTE — Progress Notes (Signed)
ANTIBIOTIC CONSULT NOTE - INITIAL  Pharmacy Consult for Vancomycin/Zosyn  Indication: rule out sepsis, ?abdominal source  Allergies  Allergen Reactions  . Aspirin     To much aspirin make his bleed on the inside can take a small amount.  . Flomax [Tamsulosin Hcl] Swelling  . Morphine And Related Other (See Comments)    "MAKES HIM MEAN"    Patient Measurements: Height: 6' (182.9 cm) Weight: 149 lb 8 oz (67.813 kg) IBW/kg (Calculated) : 77.6  Vital Signs: Temp: 98.4 F (36.9 C) (06/13 0429) Temp Source: Oral (06/13 0429) BP: 82/48 mmHg (06/13 0000) Pulse Rate: 94 (06/12 2000)  Labs:  Recent Labs  04/03/15 1019  CREATININE 1.38*   Estimated Creatinine Clearance: 51.2 mL/min (by C-G formula based on Cr of 1.38). No results for input(s): VANCOTROUGH, VANCOPEAK, VANCORANDOM, GENTTROUGH, GENTPEAK, GENTRANDOM, TOBRATROUGH, TOBRAPEAK, TOBRARND, AMIKACINPEAK, AMIKACINTROU, AMIKACIN in the last 72 hours.   Microbiology: Recent Results (from the past 720 hour(s))  Urine culture     Status: None   Collection Time: 03/07/15  3:32 PM  Result Value Ref Range Status   Specimen Description URINE, CATHETERIZED  Final   Special Requests NONE  Final   Colony Count   Final    >=100,000 COLONIES/ML Performed at [facility]    Culture   Final    KLEBSIELLA PNEUMONIAE Performed at [facility]    Report Status 03/09/2015 FINAL  Final   Organism ID, Bacteria KLEBSIELLA PNEUMONIAE  Final      Susceptibility   Klebsiella pneumoniae - MIC*    AMPICILLIN >=32 RESISTANT Resistant     CEFAZOLIN <=4 SENSITIVE Sensitive     CEFTRIAXONE <=1 SENSITIVE Sensitive     CIPROFLOXACIN <=0.25 SENSITIVE Sensitive     GENTAMICIN <=1 SENSITIVE Sensitive     LEVOFLOXACIN <=0.12 SENSITIVE Sensitive     NITROFURANTOIN 64 INTERMEDIATE Intermediate     TOBRAMYCIN <=1 SENSITIVE Sensitive     TRIMETH/SULFA <=20 SENSITIVE Sensitive     PIP/TAZO <=4 SENSITIVE Sensitive     * KLEBSIELLA  PNEUMONIAE  Culture, blood (routine x 2)     Status: None   Collection Time: 03/07/15  6:02 PM  Result Value Ref Range Status   Specimen Description BLOOD LEFT WRIST  Final   Special Requests BOTTLES DRAWN AEROBIC AND ANAEROBIC 5CC  Final   Culture   Final    NO GROWTH 5 DAYS Performed at [facility]    Report Status 03/13/2015 FINAL  Final  Culture, blood (routine x 2)     Status: None   Collection Time: 03/07/15  6:03 PM  Result Value Ref Range Status   Specimen Description BLOOD RIGHT HAND  Final   Special Requests BOTTLES DRAWN AEROBIC AND ANAEROBIC 5CC  Final   Culture   Final    NO GROWTH 5 DAYS Performed at [facility]    Report Status 03/13/2015 FINAL  Final  Culture, blood (routine x 2)     Status: None   Collection Time: 03/26/15  2:22 AM  Result Value Ref Range Status   Specimen Description BLOOD RIGHT ANTECUBITAL  Final   Special Requests BOTTLES DRAWN AEROBIC AND ANAEROBIC 5CC EACH  Final   Culture   Final    NO GROWTH 5 DAYS Performed at [facility]    Report Status 04/01/2015 FINAL  Final  Culture, blood (routine x 2)     Status: None   Collection Time: 03/26/15  2:30 AM  Result Value  Ref Range Status   Specimen Description BLOOD RIGHT HAND  Final   Special Requests BOTTLES DRAWN AEROBIC ONLY 5CC  Final   Culture   Final    NO GROWTH 5 DAYS Performed at [facility]    Report Status 04/01/2015 FINAL  Final  MRSA PCR Screening     Status: None   Collection Time: 03/26/15  9:27 PM  Result Value Ref Range Status   MRSA by PCR NEGATIVE NEGATIVE Final    Comment:        The GeneXpert MRSA Assay (FDA approved for NASAL specimens only), is one component of a comprehensive MRSA colonization surveillance program. It is not intended to diagnose MRSA infection nor to guide or monitor treatment for MRSA infections.     Medical History: Past Medical History  Diagnosis Date  . Seizure disorder   . Nausea   . History of  heartburn   . Chronic back pain   . IHD (ischemic heart disease)   . Cigarette smoker   . Hypercholesterolemia   . CHF (congestive heart failure)     EF had been 20% initially. 2010 EF 35-40%.  . Traumatic brain injury   . Seizures   . COPD (chronic obstructive pulmonary disease)   . Pancreatitis     Assessment: 64 y/o M here for several days, now with CT abdomen showing significantly worsening pancreatitis, possible peritonitis, pancreatic pseudocyst, possible hepatic abscess, starting broad spectrum anti-biotics, noted renal dysfunction.   Goal of Therapy:  Vancomycin trough level 15-20 mcg/ml  Plan:  -Vancomycin 1000 mg IV x 1, then 500 mg IV q12h -Zosyn 3.375G IV q8h to be infused over 4 hours -Trend WBC, temp, renal function  -Drug levels as indicated   Abran Duke 04/05/2015,4:49 AM

## 2015-04-05 NOTE — Progress Notes (Signed)
CRITICAL VALUE ALERT  Critical value received:  0655   Date of notification:  04/05/2015  Time of notification:  0655  Critical value read back:yes  Nurse who received alert:  Rise Paganini RN  MD notified (1st page):  Vinnie Langton RN Time of first page: [phone]

## 2015-04-05 NOTE — Consult Note (Addendum)
PULMONARY / CRITICAL CARE MEDICINE   Name: [name] MRN: [id number] DOB: 07/16/1951    ADMISSION DATE:  03/26/2015 CONSULTATION DATE:  6/13  REFERRING MD :  Cardiology  PT PROFILE:  36 M with PMH of TBI, sz d/o, ischemic cardiomyopathy, pancreatitis adm to [facility] service 6/03 with L sided CP, dyspnea, elevated troponin I (NSTEMI), hypotension, AKI. Cards consult 6/03: IV heparin, dopamine, plan for LHC. LHC 6/06: severe native CAD and occlusion of SVG to LAD> LVEF 15%. No PCI options. Dopamine discontinued 6/10. Inpt rehab planned. Developed abd pain 6/12 with N/V. GI consultation performed. CTAP revealed severe pancreatitis. Developed hypotension 6/13 AM and transferred to ICU for resumption of vasopressors.   STUDIES/SIGNIFICANT EVENTS: 6/03 admitted as above 6/06 LHC: severe native vessel CAD. 100% occlusion of SVG to LAD. No PCI options 6/12 Abd pain, N/V. GI consultation.  6/12 CTAP: severe pancreatitis with pseudocysts, concern for peritonitis, concern for hepatic abscess 6/13 Hypotension. Transferred to ICU. PCCM consultation 6/13 weaned off dopamine   INDWELLING DEVICES:: R IJ CVL 6/13 >>   MICRO DATA: MRSA PCR 6/03 >> NEG Blood 6/03 >> NEG Blood 6/13 >>   ANTIMICROBIALS:  Vanc 6/13 >> 6/13 Pip-tazo 6/13 >>    HISTORY OF PRESENT ILLNESS:   As summarized above. Pt presently very somnolent and provides little further history  PAST MEDICAL HISTORY :   has a past medical history of Seizure disorder; Nausea; History of heartburn; Chronic back pain; IHD (ischemic heart disease); Cigarette smoker; Hypercholesterolemia; CHF (congestive heart failure); Traumatic brain injury; Seizures; COPD (chronic obstructive pulmonary disease); and Pancreatitis.  has past surgical history that includes Cardiac catheterization (03/12/2007); Coronary artery bypass graft (2000); Shoulder surgery; Facial reconstruction surgery; Back surgery; and Cardiac catheterization (N/A, 03/29/2015). Prior  to Admission medications   Medication Sig Start Date End Date Taking? Authorizing Provider  aspirin 325 MG tablet Take 325 mg by mouth daily.     Yes Historical Provider, MD  divalproex (DEPAKOTE) 500 MG EC tablet Take 500 mg by mouth 3 (three) times daily. Prescribed by  his neurologist in [facility]   Yes Historical Provider, MD  furosemide (LASIX) 40 MG tablet TAKE ONE TABLET BY MOUTH EVERY OTHER DAY   Yes Rollene Rotunda, MD  liver oil-zinc oxide (DESITIN) 40 % ointment Apply topically every morning. 03/10/15  Yes Yolande Jolly, MD  pravastatin (PRAVACHOL) 40 MG tablet Take 1 tablet (40 mg total) by mouth daily. 05/14/14  Yes Rollene Rotunda, MD  ranitidine (ZANTAC) 150 MG capsule Take 300 mg by mouth daily.    Yes Historical Provider, MD   Allergies  Allergen Reactions  . Aspirin     To much aspirin make his bleed on the inside can take a small amount.  . Flomax [Tamsulosin Hcl] Swelling  . Morphine And Related Other (See Comments)    "MAKES HIM MEAN"    FAMILY HISTORY:  indicated that his mother is deceased. He indicated that his father is deceased. He indicated that his sister is alive. He indicated that two of his four brothers are alive.  SOCIAL HISTORY:  reports that he has been smoking Cigarettes.  He has a 37.5 pack-year smoking history. He has never used smokeless tobacco. He reports that he does not drink alcohol or use illicit drugs.  REVIEW OF SYSTEMS:  Level 5 caveat  SUBJECTIVE:   VITAL SIGNS: Temp:  [97.6 F (36.4 C)-99.1 F (37.3 C)] 98.9 F (37.2 C) (06/13 1245) Pulse Rate:  [34-115] 93 (  06/13 1245) Resp:  [13-28] 24 (06/13 1245) BP: (62-116)/(45-79) 91/62 mmHg (06/13 1245) SpO2:  [76 %-100 %] 93 % (06/13 1245) Weight:  [67.813 kg (149 lb 8 oz)-70.7 kg (155 lb 13.8 oz)] 70.7 kg (155 lb 13.8 oz) (06/13 0650) HEMODYNAMICS:   VENTILATOR SETTINGS:   INTAKE / OUTPUT:  Intake/Output Summary (Last 24 hours) at 04/05/15 1313 Last data filed at 04/05/15 1200   Gross per 24 hour  Intake    690 ml  Output    840 ml  Net   -150 ml    PHYSICAL EXAMINATION: General: chronically ill appearing, NAD Neuro: RASS -2, + F/C, not very interactive, MAEs HEENT: WNL Cardiovascular: reg, no M Lungs: No wheezes Abdomen: mildly distended, diffusely mildly tender, + BS Ext: cool, diminished pulses, no edema  LABS:  CBC  Recent Labs Lab 03/31/15 0235 04/01/15 0242 04/05/15 0536  WBC 9.6 8.9 14.6*  HGB 8.6* 8.2* 10.7*  HCT 25.6* 24.7* 31.5*  PLT 165 155 194   Coag's No results for input(s): APTT, INR in the last 168 hours. BMET  Recent Labs Lab 04/03/15 1019 04/05/15 0536  NA 128* 130*  K 3.9 3.7  CL 85* 88*  CO2 31 32  BUN 22* 33*  CREATININE 1.38* 1.66*  GLUCOSE 107* 96   Electrolytes  Recent Labs Lab 04/03/15 1019 04/05/15 0536  CALCIUM 8.6* 7.9*   Sepsis Markers  Recent Labs Lab 04/05/15 0550  LATICACIDVEN 2.4*   ABG No results for input(s): PHART, PCO2ART, PO2ART in the last 168 hours. Liver Enzymes  Recent Labs Lab 04/04/15 1222  AST 15  ALT 6*  ALKPHOS 50  BILITOT 1.0  ALBUMIN 2.1*   Cardiac Enzymes  Recent Labs Lab 04/05/15 1125  TROPONINI 0.05*   Glucose  Recent Labs Lab 04/05/15 0632  GLUCAP 94    CXR: mild IS edema pattern    ASSESSMENT / PLAN:  PULMONARY A: Hypoxic resp failure Mild pulmonary edema Smoker, hx COPD P:   Supp O2 as needed to maintain SpO2 > 92% Monitor in ICU  CARDIOVASCULAR A: Severe ischemic cardiomyopathy Shock - cardiogenic +/- septic +/- hypovolemic  Improved with volume P:  MAP goal > 65 mmHg Cont vasopressors as needed  RENAL A:   AKI Very poor candidate for HD (even short term) P:   Monitor BMET intermittently Monitor I/Os Correct electrolytes as indicated IVFs adjusted 6/13  GASTROINTESTINAL A:   Chronic pancreatitis Acute pancreatitis - etiology unknown CT findings c/w peritonitis Suspected liver abscess by CT  Very poor candidate  for major surgery P:   SUP: IV PPI Cont clear liquids GI service following Plan re-image abdmonen in a few days  HEMATOLOGIC A:   Mild anemia without acute blood loss P:  DVT px: SQ heparin Monitor CBC intermittently Transfuse per usual ICU guidelines  INFECTIOUS A:   Chronic pancreatitis - unclear etiology (?valproic acid) Suspect acute pancreatitis with pseudocysts Suspected peritonitis Suspected hepatic abscess P:   Monitor temp, WBC count Micro and abx as above  ENDOCRINE A:   No issues P:   Monitor glu on chem panels Consider SSI for glu > 200  NEUROLOGIC A:   Traumatic brain injury Acute encephalopathy Seizure d/o (chronically on VPA) Pain (abdominal) Baseline impairment P:   RASS goal: 0 Change VPA to levetiracetam  Low dose PRN opioids Need to address goals of care   Billy Fischer, MD ; [facility] service Mobile [phone].  After 5:30 PM or weekends, call [phone] Pulmonary and Critical  Care Medicine [facility] Pager: [phone]  04/05/2015, 1:13 PM

## 2015-04-06 ENCOUNTER — Ambulatory Visit: Payer: Medicare Other | Admitting: Primary Care

## 2015-04-06 ENCOUNTER — Inpatient Hospital Stay (HOSPITAL_COMMUNITY): Payer: Medicare Other

## 2015-04-06 DIAGNOSIS — I255 Ischemic cardiomyopathy: Secondary | ICD-10-CM

## 2015-04-06 DIAGNOSIS — K853 Drug induced acute pancreatitis: Secondary | ICD-10-CM

## 2015-04-06 LAB — COMPREHENSIVE METABOLIC PANEL
ALBUMIN: 1.5 g/dL — AB (ref 3.5–5.0)
ALK PHOS: 41 U/L (ref 38–126)
ALT: 6 U/L — ABNORMAL LOW (ref 17–63)
AST: 11 U/L — ABNORMAL LOW (ref 15–41)
Anion gap: 9 (ref 5–15)
BILIRUBIN TOTAL: 0.8 mg/dL (ref 0.3–1.2)
BUN: 33 mg/dL — AB (ref 6–20)
CHLORIDE: 90 mmol/L — AB (ref 101–111)
CO2: 33 mmol/L — AB (ref 22–32)
CREATININE: 1.45 mg/dL — AB (ref 0.61–1.24)
Calcium: 8.6 mg/dL — ABNORMAL LOW (ref 8.9–10.3)
GFR calc non Af Amer: 49 mL/min — ABNORMAL LOW (ref >60)
GFR, EST AFRICAN AMERICAN: 57 mL/min — AB (ref >60)
Glucose, Bld: 100 mg/dL — ABNORMAL HIGH (ref 65–99)
Potassium: 3.3 mmol/L — ABNORMAL LOW (ref 3.5–5.1)
Sodium: 132 mmol/L — ABNORMAL LOW (ref 135–145)
TOTAL PROTEIN: 5.3 g/dL — AB (ref 6.5–8.1)

## 2015-04-06 LAB — CBC
HCT: 27.4 % — ABNORMAL LOW (ref 39.0–52.0)
Hemoglobin: 9.1 g/dL — ABNORMAL LOW (ref 13.0–17.0)
MCH: 33.2 pg (ref 26.0–34.0)
MCHC: 33.2 g/dL (ref 30.0–36.0)
MCV: 100 fL (ref 78.0–100.0)
Platelets: 154 10*3/uL (ref 150–400)
RBC: 2.74 MIL/uL — ABNORMAL LOW (ref 4.22–5.81)
RDW: 15.3 % (ref 11.5–15.5)
WBC: 14.7 10*3/uL — ABNORMAL HIGH (ref 4.0–10.5)

## 2015-04-06 LAB — AMMONIA: AMMONIA: 28 umol/L (ref 9–35)

## 2015-04-06 LAB — GLUCOSE, CAPILLARY
GLUCOSE-CAPILLARY: 91 mg/dL (ref 65–99)
GLUCOSE-CAPILLARY: 80 mg/dL (ref 65–99)
Glucose-Capillary: 77 mg/dL (ref 65–99)

## 2015-04-06 LAB — LIPASE, BLOOD: LIPASE: 182 U/L — AB (ref 22–51)

## 2015-04-06 MED ORDER — FAMOTIDINE 20 MG PO TABS
20.0000 mg | ORAL_TABLET | Freq: Every day | ORAL | Status: DC
  Administered 2015-04-06 – 2015-04-07 (×2): 20 mg via ORAL
  Filled 2015-04-06 (×2): qty 1

## 2015-04-06 MED ORDER — POTASSIUM CHLORIDE 10 MEQ/50ML IV SOLN
10.0000 meq | INTRAVENOUS | Status: AC
  Administered 2015-04-06 (×4): 10 meq via INTRAVENOUS
  Filled 2015-04-06 (×4): qty 50

## 2015-04-06 MED ORDER — PERFLUTREN LIPID MICROSPHERE
1.0000 mL | INTRAVENOUS | Status: AC | PRN
  Administered 2015-04-06: 2 mL via INTRAVENOUS
  Filled 2015-04-06: qty 10

## 2015-04-06 NOTE — Progress Notes (Signed)
  Echocardiogram 2D Echocardiogram has been performed.  [name] M 04/06/2015, 9:25 AM

## 2015-04-06 NOTE — Care Management Note (Signed)
Case Management Note  Patient Details  Name: [name] MRN: [id number] Date of Birth: 02-21-1951  Subjective/Objective:                    Action/Plan:   Expected Discharge Date:                  Expected Discharge Plan:  Home w Home Health Services  In-House Referral:     Discharge planning Services     Post Acute Care Choice:  Resumption of Svcs/PTA Provider Choice offered to:     DME Arranged:    DME Agency:     HH Arranged:  RN, PT HH Agency:  [facility]  Status of Service:     Medicare Important Message Given:  Yes Date Medicare IM Given:  03/29/15 Medicare IM give by:  debbie dowell rn,bsn Date Additional Medicare IM Given:  04/06/15 Additional Medicare Important Message give by:  Avie Arenas, RNBSN  If discussed at Long Length of Stay Meetings, dates discussed:    Additional Comments:  Vangie Bicker, RN 04/06/2015, 10:38 AM

## 2015-04-06 NOTE — Care Management Note (Signed)
Case Management Note  Patient Details  Name: [name] MRN: [id number] Date of Birth: [birth year]  Subjective/Objective:         Adm w heart failure           Action/Plan: lives w fam, [facility]   Expected Discharge Date:                  Expected Discharge Plan:  Home w Home Health Services  In-House Referral:     Discharge planning Services     Post Acute Care Choice:  Resumption of Svcs/PTA Provider Choice offered to:     DME Arranged:    DME Agency:     HH Arranged:  RN, PT HH Agency:  [facility]  Status of Service:     Medicare Important Message Given:  Yes Date Medicare IM Given:  03/29/15 Medicare IM give by:  debbie dowell rn,bsn Date Additional Medicare IM Given:  04/01/15 Additional Medicare Important Message give by:  debbie dowell rn,bsn  If discussed at Long Length of Stay Meetings, dates discussed:   04-06-15  Patient to ICU for hypotension - ?? Pancreatitis - sepsis - started on dopamine. Tried to talk to patient but mumbles words and does not open eyes.  Pt on 6-10 recommending home PT??  At this time questioning may need higher level, may even be eligible for Ltach.    Plan for tx out of ICU today.  CM will continue progression.   Additional Comments: pt states new pcp is [facility]. [facility] is [facility] w [facility]. Have alerted donna w [facility] of adm.  Vangie Bicker, RN 04/06/2015, 10:33 AM

## 2015-04-06 NOTE — Progress Notes (Signed)
PT Cancellation Note  Patient Details Name: [name] MRN: [id number] DOB: 01-Dec-1950   Cancelled Treatment:    Reason Eval/Treat Not Completed: Fatigue/lethargy limiting ability to participate Spoke with Victorino Dike, RN who reports that patient has been lethargic and minimally responsive today. Not appropriate for therapy at this time. Will continue to follow and progress patient as able.  [name] 04/06/2015, 4:03 PM Charlsie Merles, [place] [phone]

## 2015-04-06 NOTE — Progress Notes (Signed)
PULMONARY / CRITICAL CARE MEDICINE   Name: [name] MRN: [id number] DOB: 06/06/1951    ADMISSION DATE:  03/26/2015 CONSULTATION DATE:  6/13  REFERRING MD :  Cardiology  PT PROFILE:  42 M with PMH of TBI, sz d/o, ischemic cardiomyopathy, pancreatitis adm to [facility] service 6/03 with L sided CP, dyspnea, elevated troponin I (NSTEMI), hypotension, AKI. Cards consult 6/03: IV heparin, dopamine, plan for LHC. LHC 6/06: severe native CAD and occlusion of SVG to LAD> LVEF 15%. No PCI options. Dopamine discontinued 6/10. Inpt rehab planned. Developed abd pain 6/12 with N/V. GI consultation performed. CTAP revealed severe pancreatitis. Developed hypotension 6/13 AM and transferred to ICU for resumption of vasopressors.   STUDIES/SIGNIFICANT EVENTS: 6/03 admitted as above 6/06 LHC: severe native vessel CAD. 100% occlusion of SVG to LAD. No PCI options 6/12 Abd pain, N/V. GI consultation.  6/12 CTAP: severe pancreatitis with pseudocysts, concern for peritonitis, concern for hepatic abscess 6/13 Hypotension. Transferred to ICU. PCCM consultation 6/13 weaned off dopamine 6/14 Remained off dopamine. Renal function improved. Transfer to SDU. Advance diet 6/14 TTE: The cavity size was normal. LVEF 20-25%. Diffuse hypokinesis. There is akinesis of the mid-apicalanteroseptal, lateral, inferior, and inferoseptal myocardium.   INDWELLING DEVICES:: R IJ CVL 6/13 >>   MICRO DATA: MRSA PCR 6/03 >> NEG Blood 6/03 >> NEG Blood 6/13 >>   ANTIMICROBIALS:  Vanc 6/13 >> 6/13 Pip-tazo 6/13 >>    SUBJECTIVE:  Somewhat somnolent but appropriate  VITAL SIGNS: Temp:  [98.7 F (37.1 C)-99.6 F (37.6 C)] 98.9 F (37.2 C) (06/14 0900) Pulse Rate:  [79-124] 84 (06/14 0900) Resp:  [13-29] 21 (06/14 0900) BP: (73-127)/(52-80) 103/67 mmHg (06/14 0900) SpO2:  [88 %-100 %] 96 % (06/14 0900) Weight:  [71.7 kg (158 lb 1.1 oz)] 71.7 kg (158 lb 1.1 oz) (06/14 0500) HEMODYNAMICS:   VENTILATOR SETTINGS:    INTAKE / OUTPUT:  Intake/Output Summary (Last 24 hours) at 04/06/15 1029 Last data filed at 04/06/15 0900  Gross per 24 hour  Intake 1715.42 ml  Output    460 ml  Net 1255.42 ml    PHYSICAL EXAMINATION: General: chronically ill appearing, NAD Neuro: RASS -1, + F/C, no focal deficits HEENT: WNL Cardiovascular: reg, no M Lungs: No wheezes Abdomen: non-distended, minimally tender, + BS Ext: diminished pulses, no edema  LABS:  CBC  Recent Labs Lab 04/01/15 0242 04/05/15 0536 04/06/15 0515  WBC 8.9 14.6* 14.7*  HGB 8.2* 10.7* 9.1*  HCT 24.7* 31.5* 27.4*  PLT 155 194 154   Coag's No results for input(s): APTT, INR in the last 168 hours. BMET  Recent Labs Lab 04/03/15 1019 04/05/15 0536 04/06/15 0515  NA 128* 130* 132*  K 3.9 3.7 3.3*  CL 85* 88* 90*  CO2 31 32 33*  BUN 22* 33* 33*  CREATININE 1.38* 1.66* 1.45*  GLUCOSE 107* 96 100*   Electrolytes  Recent Labs Lab 04/03/15 1019 04/05/15 0536 04/06/15 0515  CALCIUM 8.6* 7.9* 8.6*   Sepsis Markers  Recent Labs Lab 04/05/15 0550  LATICACIDVEN 2.4*   ABG No results for input(s): PHART, PCO2ART, PO2ART in the last 168 hours. Liver Enzymes  Recent Labs Lab 04/04/15 1222 04/06/15 0515  AST 15 11*  ALT 6* 6*  ALKPHOS 50 41  BILITOT 1.0 0.8  ALBUMIN 2.1* 1.5*   Cardiac Enzymes  Recent Labs Lab 04/05/15 1125  TROPONINI 0.05*   Glucose  Recent Labs Lab 04/05/15 0632 04/05/15 1558 04/05/15 2348 04/06/15 0801  GLUCAP 94  103* 77 91    CXR: b atx. R>L effusions    ASSESSMENT / PLAN:  PULMONARY A: Hypoxic resp failure Mild pulmonary edema Pleural effusions Smoker, hx COPD P:   Supp O2 as needed to maintain SpO2 > 92%  CARDIOVASCULAR A: Severe ischemic cardiomyopathy Shock, resolved. Improved with volume P:  MAP goal > 60 mmHg Further mgmt per Cards  RENAL A:   AKI, improving Hypokalemia P:   Monitor BMET intermittently Monitor I/Os Correct electrolytes as  indicated  GASTROINTESTINAL A:   Chronic pancreatitis Acute pancreatitis - etiology unknown CT findings c/w peritonitis - exam unimpressive Possible liver abscess by CT  Very poor candidate for major surgery P:   SUP: enteral famotidine Adv diet to full liquids 6/14 GI service following Plan repeat CT abdmonen in a few days  HEMATOLOGIC A:   Mild anemia without acute blood loss P:  DVT px: SQ heparin Monitor CBC intermittently Transfuse per usual guidelines  INFECTIOUS A:   Chronic pancreatitis   Unclear etiology (was on valproic acid PTA - 1-5% risk of pancreatitis) Suspect acute pancreatitis with pseudocysts Suspected peritonitis Suspected hepatic abscess P:   Monitor temp, WBC count Micro and abx as above  ENDOCRINE A:   No issues P:   Monitor glu on chem panels Consider SSI for glu > 200  NEUROLOGIC A:   Remote traumatic brain injury Acute encephalopathy, resolved Seizure d/o (chronically on VPA PTA. Changed to [place] 6/13) H/O pseudoseizures Pain (abdominal) - improved Baseline severe impairment P:   RASS goal: 0 Cont levetiracetam - initiated 6/13 Cont low dose PRN opioids   Discussed goals of care with pt's wife. She indicates that these matters have been clearly discussed btw her and pt in the past. Further, she and pt met with Dr Linda Hedges last hospitalization. She indicates that pt would not wish to be sustained on life support beyond a few days and would not wish to undergo ACLS or intubation if there is no hope of meaningful, functional recovery. For now, I have left him as full code but assured her that we would comply with these wishes.   Merton Border, MD ; [facility] [phone].  After 5:30 PM or weekends, call [phone] Pulmonary and [place] Pager: [phone]  04/06/2015, 10:29 AM

## 2015-04-06 NOTE — Progress Notes (Signed)
Patient Name: [name] Date of Encounter: 04/06/2015  Primary Cardiologist: Dr. Antoine Poche   Principal Problem:   Acute on chronic combined systolic and diastolic congestive heart failure, NYHA class 3 Active Problems:   S/P CABG (coronary artery bypass graft)   NSTEMI (non-ST elevated myocardial infarction)   AKI (acute kidney injury)   Acute respiratory failure with hypoxia   Hypotension   Acute on chronic combined systolic and diastolic congestive heart failure, NYHA class 4   Elevated troponin   Respiratory distress, acute   SOB (shortness of breath)   Pressure ulcer    SUBJECTIVE  Continue to be lethargic, opened eye once, however did not followup command, went back to sleep, snoring  CURRENT MEDS . antiseptic oral rinse  7 mL Mouth Rinse BID  . aspirin EC  81 mg Oral Daily  . famotidine  20 mg Oral Daily  . heparin  5,000 Units Subcutaneous 3 times per day  . levETIRAcetam  500 mg Intravenous Q12H  . piperacillin-tazobactam (ZOSYN)  IV  3.375 g Intravenous 3 times per day  . potassium chloride  10 mEq Intravenous Q1 Hr x 4  . ranolazine  500 mg Oral BID    OBJECTIVE  Filed Vitals:   04/06/15 1130 04/06/15 1200 04/06/15 1230 04/06/15 1300  BP: 83/47 91/61    Pulse: 77 73 74 80  Temp: 99.2 F (37.3 C) 99.3 F (37.4 C) 99.2 F (37.3 C) 99.2 F (37.3 C)  TempSrc:      Resp: 33  Height:      Weight:      SpO2:  98%  97%    Intake/Output Summary (Last 24 hours) at 04/06/15 1508 Last data filed at 04/06/15 1200  Gross per 24 hour  Intake 1511.02 ml  Output    430 ml  Net 1081.02 ml   Filed Weights   04/05/15 0429 04/05/15 0650 04/06/15 0500  Weight: 149 lb 8 oz (67.813 kg) 155 lb 13.8 oz (70.7 kg) 158 lb 1.1 oz (71.7 kg)    PHYSICAL EXAM  Neuro: unable to assess HEENT:  Normal  Neck: Supple without bruits. RIJ in place Lungs:  Resp regular and unlabored, anterior exam CTA. Heart: RRR no s3, s4, or murmurs. Abdomen: Soft,  non-distended, BS + x 4.  Extremities: No clubbing, cyanosis or edema. DP/PT/Radials 2+ and equal bilaterally.  Accessory Clinical Findings  CBC  Recent Labs  04/05/15 0536 04/06/15 0515  WBC 14.6* 14.7*  HGB 10.7* 9.1*  HCT 31.5* 27.4*  MCV 99.4 100.0  PLT 194 154   Basic Metabolic Panel  Recent Labs  04/05/15 0536 04/06/15 0515  NA 130* 132*  K 3.7 3.3*  CL 88* 90*  CO2 32 33*  GLUCOSE 96 100*  BUN 33* 33*  CREATININE 1.66* 1.45*  CALCIUM 7.9* 8.6*   Liver Function Tests  Recent Labs  04/04/15 1222 04/06/15 0515  AST 15 11*  ALT 6* 6*  ALKPHOS 50 41  BILITOT 1.0 0.8  PROT 6.1* 5.3*  ALBUMIN 2.1* 1.5*    Recent Labs  04/04/15 1222 04/06/15 0515  LIPASE 62* 182*  AMYLASE 125*  --    Cardiac Enzymes  Recent Labs  04/05/15 1125  TROPONINI 0.05*    TELE NSR with HR 70-80s, occasional PVCs    ECG  No new EKG  Echocardiogram  04/06/2015  LV EF: 20% -  25%  ------------------------------------------------------------------- Indications:   Cardiomyopathy - ischemic 414.8.  ------------------------------------------------------------------- History:  PMH: 4cm Liver Abscess. Abdominal Aortic Aneurysm. Elevated Troponin. NSTEMI. Altered mental status. Coronary artery disease. Risk factors: Current tobacco use.  ------------------------------------------------------------------- Study Conclusions  - Left ventricle: The cavity size was normal. Systolic function was severely reduced. The estimated ejection fraction was in the range of 20% to 25%. Diffuse hypokinesis. There is akinesis of the mid-apicalanteroseptal, lateral, inferior, and inferoseptal myocardium. Doppler parameters are consistent with abnormal left ventricular relaxation (grade 1 diastolic dysfunction). - Mitral valve: Calcified annulus. There was mild regurgitation. - Pulmonic valve: There was moderate regurgitation.  Impressions:  - EF is reduced  when compared to prior echocardiogram.     Radiology/Studies  Ct Abdomen Pelvis Wo Contrast  04/05/2015   CLINICAL DATA:  Acute onset of generalized abdominal pain, nausea and vomiting. Initial encounter.  EXAM: CT ABDOMEN AND PELVIS WITHOUT CONTRAST  TECHNIQUE: Multidetector CT imaging of the abdomen and pelvis was performed following the standard protocol without IV contrast.  COMPARISON:  CT of the abdomen and pelvis performed 02/20/2015  FINDINGS: Small bilateral pleural effusions are noted, with partial consolidation of both lower lung lobes. Underlying pneumonia cannot be excluded. Diffuse coronary artery calcifications are seen. Trace pericardial fluid remains within normal limits.  There is an apparent 5.1 x 1.2 cm pseudocyst extending along the body of the pancreas, increased in size from the prior study. Diffuse surrounding soft tissue edema and fluid are seen, with new large pseudocysts compressing the adjacent liver. These measure approximately 12.4 x 4.7 cm anterior to the left hepatic lobe, and 12.2 x 5.6 cm along the inferior aspect of the hepatic hilum, extending adjacent to the gallbladder fossa.  There is vague diffuse soft tissue edema involving the omentum, with associated prominence of the underlying vasculature. Free fluid is seen tracking along the paracolic gutters and into the pelvis. Soft tissue inflammation tracks about the descending colon, without evidence of significant wall thickening or diverticula. This raises concern for some degree of underlying peritonitis.  A somewhat unusual low-attenuation focus is noted at the caudate lobe, measuring approximately 4.4 x 3.7 cm. This raises concern for an underlying hepatic abscess, though this is not well assessed without contrast. The spleen is unremarkable in appearance.  The gallbladder is grossly unremarkable in appearance. Scattered calcifications throughout the pancreas likely reflect sequelae of chronic pancreatitis. The adrenal  glands are grossly unremarkable in appearance.  There is severe left renal atrophy. Nonspecific perinephric stranding is noted bilaterally. The right kidney is otherwise unremarkable. There is no evidence of hydronephrosis. No renal or ureteral stones are seen.  The small bowel is unremarkable in appearance. The stomach is within normal limits. No acute vascular abnormalities are seen. The abdominal aorta is dilated up to 4.8 cm in maximal AP dimension, relatively stable from prior studies. 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 These were seen to be filled with thrombus on the last contrast-enhanced study, though some are mildly more prominent than on the prior study.  The appendix is normal in caliber, without evidence of appendicitis. Aside from the inflammation along the descending colon as described above, the colon is grossly unremarkable in appearance.  The bladder is mildly distended and grossly unremarkable. The prostate remains normal in size. No inguinal lymphadenopathy is seen.  No acute osseous abnormalities are identified. Disc space narrowing is noted at L3-L4.  IMPRESSION: 1. Vague diffuse soft tissue edema involving the omentum, with associated prominence of the underlying vasculature. Free fluid tracks  along the paracolic gutters into the pelvis. Soft tissue inflammation tracks along the descending colon, without evidence of significant wall thickening or diverticulum. These findings are all new from the prior study. Given the patient's significantly worsened pancreatitis, this raises concern for some degree of underlying peritonitis. 2. The patient's pancreatitis has markedly worsened since the prior study, with an apparent 5.1 x 1.2 cm pseudocyst extending along the body of the pancreas. Diffuse surrounding soft tissue edema and fluid noted, with new large pseudocysts seen compressing the adjacent liver.  These measure approximately 12.4 x 4.7 cm anterior to the left hepatic lobe, and 12.2 x 5.6 cm along the inferior aspect of the hepatic hilum, extending adjacent to the gallbladder fossa. Underlying diffuse calcifications within the pancreas reflect sequelae of chronic pancreatitis. 3. Somewhat unusual low-attenuation focus at the caudate lobe of the liver, measuring 4.4 x 3.7 cm and new from the prior study. This is concerning for a hepatic abscess, though not well assessed without contrast. Hepatic abscesses have been known to occur as a sequelae of chronic pancreatitis, and amoebic liver abscess has also been known to occur in conjunction with acute pancreatitis. 4. New small bilateral pleural effusions, with partial consolidation of both lower lung lobes. Pneumonia cannot be excluded, though this likely reflects the underlying pancreatitis. 5. Abdominal aorta dilated up to 4.8 cm in maximal AP dimension, relatively stable from prior studies. 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 These were seen to be filled with thrombus on the last contrast-enhanced study, though some are mildly more prominent than on the prior study. 6. Diffuse coronary artery calcifications seen.  These results were called by telephone at the time of interpretation on 04/05/2015 at 2:34 am to Dr. Benay Pillow, who verbally acknowledged these results.   Electronically Signed   By: Roanna Raider M.D.   On: 04/05/2015 02:37   Dg Chest 2 View  03/07/2015   CLINICAL DATA:  Fatigue and altered mental status today.  EXAM: CHEST  2 VIEW  COMPARISON:  03/05/2015 and 02/20/2015  FINDINGS: Sternotomy wires unchanged. Lungs are hypoinflated with worsening left base opacification likely small to moderate effusion with associated atelectasis although cannot exclude infection in the left base. Cardiomediastinal silhouette and remainder of the exam is unchanged.   IMPRESSION: Worsening left base opacification likely small to moderate effusion with associated atelectasis as cannot exclude infection in the left base.   Electronically Signed   By: Elberta Fortis M.D.   On: 03/07/2015 16:38   Dg Chest Port 1 View  04/06/2015   CLINICAL DATA:  64 year old male with respiratory failure, shortness of breath. Initial encounter.  EXAM: PORTABLE CHEST - 1 VIEW  COMPARISON:  04/05/2015 and earlier.  FINDINGS: Portable AP semi upright view at 0440 hours. The patient is now mildly rotated to the left. Stable right IJ central line. Stable sequelae of CABG. Stable cardiac size and mediastinal contours. Increased veiling bilateral pulmonary opacity. No superimposed pneumothorax or overt edema. The diaphragm is obscured. No other confluent opacity.  IMPRESSION: Increased bilateral pleural effusions with lower lobe collapse or consolidation. No acute pulmonary edema.   Electronically Signed   By: Odessa Fleming M.D.   On: 04/06/2015 07:19   Dg Chest Port 1 View  04/05/2015   CLINICAL DATA:  Central line placement.  EXAM: PORTABLE CHEST - 1 VIEW  COMPARISON:  03/26/2015  FINDINGS: Postoperative changes in the mediastinum. Interval placement of a right central venous  catheter with tip over the cavoatrial junction. No pneumothorax. Cardiac enlargement. Mild pulmonary vascular congestion centrally. Linear atelectasis or infiltration in the left lung base with probable small pleural effusion, increasing since previous study. No consolidation. Calcification of the aorta. Surgical clips in the upper abdomen peer  IMPRESSION: Increasing effusion and atelectasis in the left base. Cardiac enlargement with mild vascular congestion. No edema. Right central venous catheter appears in satisfactory location.   Electronically Signed   By: Burman Nieves M.D.   On: 04/05/2015 06:10   Dg Chest Port 1 View  03/26/2015   CLINICAL DATA:  Acute onset of shortness of breath. Initial encounter.  EXAM: PORTABLE  CHEST - 1 VIEW  COMPARISON:  Chest radiograph performed 03/07/2015  FINDINGS: The lungs are well-aerated. Vascular congestion is noted. Mild bibasilar opacities raise concern for mild interstitial edema. There is no evidence of pleural effusion or pneumothorax.  The cardiomediastinal silhouette is borderline normal in size. The patient is status post median sternotomy, with evidence of prior CABG. No acute osseous abnormalities are seen.  IMPRESSION: Vascular congestion noted. Mild bibasilar opacities raise concern for mild interstitial edema.   Electronically Signed   By: Roanna Raider M.D.   On: 03/26/2015 02:56   Dg Abd Portable 1v  04/03/2015   CLINICAL DATA:  Periumbilical pain, emesis, and hit steps for 3-4 days.  EXAM: PORTABLE ABDOMEN - 1 VIEW  COMPARISON:  CT abdomen and pelvis 02/20/2015  FINDINGS: Gas is present in multiple loops of small and large bowel to the level of the rectum. No dilated loops of bowel are seen to suggest obstruction. There is a small to moderate amount of stool throughout the colon. Vascular calcifications are noted. Sternotomy wires and surgical clips are partially visualized in the lower chest. No acute osseous abnormality is identified.  IMPRESSION: Nonobstructed bowel-gas pattern.   Electronically Signed   By: Sebastian Ache   On: 04/03/2015 09:35    ASSESSMENT AND PLAN  26M with CAD s/p CABG/prior MI, chronic combined CHF EF 40-45% (02/21/15), prior refusal of ICD, seizure disorder, COPD, 4.8cm AAA (04/04/15) admitted with CP and SOB. He had 3 recent admissions for pancreatitis, AMS, and PNA/UTI. Hypoxic and hypotensive on arrival with increased LEE, treated for acute on chronic CHF. Aslo required IV dopamine for renal perfusion. Cath 02/26/15 with severe ICM EF 15%, diffuse disease but no PCI options that would make sense to improve cardiac output - had moderate disease in the vein graft to the RCA system likely not flow-limiting. PCI on this vessel would be high risk given  his low EF and vein graft being one of 2 remaining conduits. Over the weekend developed stomach upset, anorexia and hiccups. CT abd showed signs of pancreatitis with a large pseudocyst, peritonitis, colitis, and 4 cm liver abscess. Also with protein calorie malnutrition, hyopnatremia.  1. Acute on chronic systolic HF  2. Persistent hypotension, related to cardiogenic and septic shock related to pancreatitis  3. Acute on chronic pancreatitis with developing peudocyst, liver abscess and peritonitis  4. Acute renal insufficiency  5. Mild anemia without acute blood loss  6. Seizure d/o - VPA changed to levetiracetam by PCCM for concern for pancreatitis contributor  7. NSTEMI earlier this admission/CAD with no revascularizable lesions suitable for PCI  8. Aortic ulcerations, not currently a good anticoag candidate due to ongoing GI issues  Little change compare to yesterday still hypotensive, off all BP med. On abx. Still very lethargic. Echo shows EF 20-25%, better than cath,  however significant down from prior echo in May. Lipase trending up. HR improved overnight. Per GI likely need drainage when abscess mature.     Ramond Dial PA-C Pager: [phone]  Patient seen and examined. Agree with assessment and plan. Repeat echo EF 20 - 25% slightly improved from cath (<15%) but much worse than prior echo (40 - 45%) of 02/22/2015.  Cr 1.45. Transaminases nl. BP low precludes nitrate/hydralazine or ACE/ARB presently.   Lennette Bihari, MD, [facility] 04/06/2015 4:52 PM

## 2015-04-06 NOTE — Progress Notes (Signed)
[facility] PROGRESS NOTE Subjective patient not have any bowel movements. He is taking a little PO but very little.  Objective: Vital signs in last 24 hours: Temp:  [97.8 F (36.6 C)-99.6 F (37.6 C)] 98.5 F (36.9 C) (06/14 1600) Pulse Rate:  [73-124] 74 (06/14 1600) Resp:  [13-33] 24 (06/14 1600) BP: (77-127)/(46-80) 89/58 mmHg (06/14 1600) SpO2:  [88 %-100 %] 98 % (06/14 1600) Weight:  [71.7 kg (158 lb 1.1 oz)] 71.7 kg (158 lb 1.1 oz) (06/14 0500) Last BM Date:  (PTA)  Intake/Output from previous day: 06/13 0701 - 06/14 0700 In: 1803.1 [I.V.:1418.1; IV Piggyback:385] Out: 990 [Urine:740; Emesis/NG output:250] Intake/Output this shift: Total I/O In: 455 [I.V.:150; IV Piggyback:305] Out: 120 [Urine:120]  PE: General-- very somnolent difficult to arouse  Abdomen-- nondistended minimal bowel sounds minimal tenderness.  Lab Results:  Recent Labs  04/05/15 0536 04/06/15 0515  WBC 14.6* 14.7*  HGB 10.7* 9.1*  HCT 31.5* 27.4*  PLT 194 154   BMET  Recent Labs  04/05/15 0536 04/06/15 0515  NA 130* 132*  K 3.7 3.3*  CL 88* 90*  CO2 32 33*  CREATININE 1.66* 1.45*   LFT  Recent Labs  04/04/15 1222 04/06/15 0515  PROT 6.1* 5.3*  AST 15 11*  ALT 6* 6*  ALKPHOS 50 41  BILITOT 1.0 0.8  BILIDIR 0.3  --   IBILI 0.7  --    PT/INR No results for input(s): LABPROT, INR in the last 72 hours. PANCREAS  Recent Labs  04/04/15 1222 04/06/15 0515  LIPASE 62* 182*         Studies/Results: Ct Abdomen Pelvis Wo Contrast  04/05/2015   CLINICAL DATA:  Acute onset of generalized abdominal pain, nausea and vomiting. Initial encounter.  EXAM: CT ABDOMEN AND PELVIS WITHOUT CONTRAST  TECHNIQUE: Multidetector CT imaging of the abdomen and pelvis was performed following the standard protocol without IV contrast.  COMPARISON:  CT of the abdomen and pelvis performed 02/20/2015  FINDINGS: Small bilateral pleural effusions are noted, with partial consolidation  of both lower lung lobes. Underlying pneumonia cannot be excluded. Diffuse coronary artery calcifications are seen. Trace pericardial fluid remains within normal limits.  There is an apparent 5.1 x 1.2 cm pseudocyst extending along the body of the pancreas, increased in size from the prior study. Diffuse surrounding soft tissue edema and fluid are seen, with new large pseudocysts compressing the adjacent liver. These measure approximately 12.4 x 4.7 cm anterior to the left hepatic lobe, and 12.2 x 5.6 cm along the inferior aspect of the hepatic hilum, extending adjacent to the gallbladder fossa.  There is vague diffuse soft tissue edema involving the omentum, with associated prominence of the underlying vasculature. Free fluid is seen tracking along the paracolic gutters and into the pelvis. Soft tissue inflammation tracks about the descending colon, without evidence of significant wall thickening or diverticula. This raises concern for some degree of underlying peritonitis.  A somewhat unusual low-attenuation focus is noted at the caudate lobe, measuring approximately 4.4 x 3.7 cm. This raises concern for an underlying hepatic abscess, though this is not well assessed without contrast. The spleen is unremarkable in appearance.  The gallbladder is grossly unremarkable in appearance. Scattered calcifications throughout the pancreas likely reflect sequelae of chronic pancreatitis. The adrenal glands are grossly unremarkable in appearance.  There is severe left renal atrophy. Nonspecific perinephric stranding is noted bilaterally. The right kidney is otherwise unremarkable. There is no evidence of hydronephrosis. No renal or ureteral stones  are seen.  The small bowel is unremarkable in appearance. The stomach is within normal limits. No acute vascular abnormalities are seen. The abdominal aorta is dilated up to 4.8 cm in maximal AP dimension, relatively stable from prior studies. Multiple focal outpouchings are noted  from the abdominal aorta along its entire course, concerning for multiple aortic ulcerations. These were seen to be filled with thrombus on the last contrast-enhanced study, though some are mildly more prominent than on the prior study.  The appendix is normal in caliber, without evidence of appendicitis. Aside from the inflammation along the descending colon as described above, the colon is grossly unremarkable in appearance.  The bladder is mildly distended and grossly unremarkable. The prostate remains normal in size. No inguinal lymphadenopathy is seen.  No acute osseous abnormalities are identified. Disc space narrowing is noted at L3-L4.  IMPRESSION: 1. Vague diffuse soft tissue edema involving the omentum, with associated prominence of the underlying vasculature. Free fluid tracks along the paracolic gutters into the pelvis. Soft tissue inflammation tracks along the descending colon, without evidence of significant wall thickening or diverticulum. These findings are all new from the prior study. Given the patient's significantly worsened pancreatitis, this raises concern for some degree of underlying peritonitis. 2. The patient's pancreatitis has markedly worsened since the prior study, with an apparent 5.1 x 1.2 cm pseudocyst extending along the body of the pancreas. Diffuse surrounding soft tissue edema and fluid noted, with new large pseudocysts seen compressing the adjacent liver. These measure approximately 12.4 x 4.7 cm anterior to the left hepatic lobe, and 12.2 x 5.6 cm along the inferior aspect of the hepatic hilum, extending adjacent to the gallbladder fossa. Underlying diffuse calcifications within the pancreas reflect sequelae of chronic pancreatitis. 3. Somewhat unusual low-attenuation focus at the caudate lobe of the liver, measuring 4.4 x 3.7 cm and new from the prior study. This is concerning for a hepatic abscess, though not well assessed without contrast. Hepatic abscesses have been known to  occur as a sequelae of chronic pancreatitis, and amoebic liver abscess has also been known to occur in conjunction with acute pancreatitis. 4. New small bilateral pleural effusions, with partial consolidation of both lower lung lobes. Pneumonia cannot be excluded, though this likely reflects the underlying pancreatitis. 5. Abdominal aorta dilated up to 4.8 cm in maximal AP dimension, relatively stable from prior studies. 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 These were seen to be filled with thrombus on the last contrast-enhanced study, though some are mildly more prominent than on the prior study. 6. Diffuse coronary artery calcifications seen.  These results were called by telephone at the time of interpretation on 04/05/2015 at 2:34 am to Dr. Benay Pillow, who verbally acknowledged these results.   Electronically Signed   By: Roanna Raider M.D.   On: 04/05/2015 02:37   Dg Chest Port 1 View  04/06/2015   CLINICAL DATA:  64 year old male with respiratory failure, shortness of breath. Initial encounter.  EXAM: PORTABLE CHEST - 1 VIEW  COMPARISON:  04/05/2015 and earlier.  FINDINGS: Portable AP semi upright view at 0440 hours. The patient is now mildly rotated to the left. Stable right IJ central line. Stable sequelae of CABG. Stable cardiac size and mediastinal contours. Increased veiling bilateral pulmonary opacity. No superimposed pneumothorax or overt edema. The diaphragm is obscured. No other confluent opacity.  IMPRESSION: Increased bilateral pleural effusions with lower lobe collapse or consolidation. No acute pulmonary  edema.   Electronically Signed   By: Odessa Fleming M.D.   On: 04/06/2015 07:19   Dg Chest Port 1 View  04/05/2015   CLINICAL DATA:  Central line placement.  EXAM: PORTABLE CHEST - 1 VIEW  COMPARISON:  03/26/2015  FINDINGS: Postoperative changes in the mediastinum. Interval placement of a right central venous  catheter with tip over the cavoatrial junction. No pneumothorax. Cardiac enlargement. Mild pulmonary vascular congestion centrally. Linear atelectasis or infiltration in the left lung base with probable small pleural effusion, increasing since previous study. No consolidation. Calcification of the aorta. Surgical clips in the upper abdomen peer  IMPRESSION: Increasing effusion and atelectasis in the left base. Cardiac enlargement with mild vascular congestion. No edema. Right central venous catheter appears in satisfactory location.   Electronically Signed   By: Burman Nieves M.D.   On: 04/05/2015 06:10    Medications: I have reviewed the patient's current medications.  Assessment/Plan: 1. Acute pancreatitis. Etiology still somewhat unclear. He apparently is had pancreatitis in the past. He will need an ultrasound at some point rule out gallstones. His daughter is at the bedside and long discussion with her. She confirms that he is a nondrinker. She had many questions about his heart and other problems which I deferred. She was told that he is quite sick from his pancreatitis and that multisystem failure was certainly possible. 2. CAD with history of congestive heart failure, seizure disorder, COPD.  Recommendation: continue full liquids for now but he may benefit from enteral feeding. Would continue antibiotics and would repeat the CT scan in about 3 days to look for signs of coalescence of his hepatic abscess and intra-abdominal fluid collections probably pseudocyst or infected pseudocysts. At some point, will need to consider percutaneous drainage. Ashtyn Meland JR,Angla Delahunt L 04/06/2015, 4:23 PM  Pager: [phone] If no answer or after hours call [phone]

## 2015-04-07 LAB — COMPREHENSIVE METABOLIC PANEL
ALBUMIN: 1.4 g/dL — AB (ref 3.5–5.0)
ALT: 5 U/L — ABNORMAL LOW (ref 17–63)
AST: 16 U/L (ref 15–41)
Alkaline Phosphatase: 54 U/L (ref 38–126)
Anion gap: 7 (ref 5–15)
BILIRUBIN TOTAL: 0.5 mg/dL (ref 0.3–1.2)
BUN: 31 mg/dL — ABNORMAL HIGH (ref 6–20)
CHLORIDE: 91 mmol/L — AB (ref 101–111)
CO2: 32 mmol/L (ref 22–32)
Calcium: 8.3 mg/dL — ABNORMAL LOW (ref 8.9–10.3)
Creatinine, Ser: 1.34 mg/dL — ABNORMAL HIGH (ref 0.61–1.24)
GFR calc Af Amer: >60 mL/min (ref >60)
GFR calc non Af Amer: 54 mL/min — ABNORMAL LOW (ref >60)
Glucose, Bld: 93 mg/dL (ref 65–99)
Potassium: 3.4 mmol/L — ABNORMAL LOW (ref 3.5–5.1)
Sodium: 130 mmol/L — ABNORMAL LOW (ref 135–145)
Total Protein: 5.1 g/dL — ABNORMAL LOW (ref 6.5–8.1)

## 2015-04-07 LAB — GLUCOSE, CAPILLARY
GLUCOSE-CAPILLARY: 68 mg/dL (ref 65–99)
GLUCOSE-CAPILLARY: 82 mg/dL (ref 65–99)
Glucose-Capillary: 103 mg/dL — ABNORMAL HIGH (ref 65–99)

## 2015-04-07 MED ORDER — PANTOPRAZOLE SODIUM 40 MG PO TBEC
40.0000 mg | DELAYED_RELEASE_TABLET | Freq: Two times a day (BID) | ORAL | Status: DC
  Administered 2015-04-07 – 2015-04-09 (×5): 40 mg via ORAL
  Filled 2015-04-07 (×5): qty 1

## 2015-04-07 NOTE — Progress Notes (Signed)
MD Tresa Endo notified; pt foley removed at 1500, condom cath applied, no urine output since then, bladder scan reveals . New order received for in-out cath.

## 2015-04-07 NOTE — Progress Notes (Signed)
[facility] PROGRESS NOTE Subjective Pt remains on zosyn, on POs but taking very little Cardiac issues remain.  Objective: Vital signs in last 24 hours: Temp:  [97.7 F (36.5 C)-98.7 F (37.1 C)] 98.7 F (37.1 C) (06/15 0413) Pulse Rate:  [70-76] 71 (06/15 0810) Resp:  [18-21] 20 (06/15 0810) BP: (90-109)/(57-67) 97/57 mmHg (06/15 0810) SpO2:  [97 %-100 %] 100 % (06/15 0810) Weight:  [74.707 kg (164 lb 11.2 oz)] 74.707 kg (164 lb 11.2 oz) (06/15 0413) Last BM Date:  (PTA)  Intake/Output from previous day: 06/14 0701 - 06/15 0700 In: 455 [I.V.:150; IV Piggyback:305] Out: 645 [Urine:645] Intake/Output this shift: Total I/O In: 240 [P.O.:240] Out: 600 [Urine:600]  PE: General--sleepy, but arouses easily  Abdomen--nondistended, good BSs, minimally tender  Lab Results:  Recent Labs  04/05/15 0536 04/06/15 0515  WBC 14.6* 14.7*  HGB 10.7* 9.1*  HCT 31.5* 27.4*  PLT 194 154   BMET  Recent Labs  04/05/15 0536 04/06/15 0515 04/07/15 1035  NA 130* 132* 130*  K 3.7 3.3* 3.4*  CL 88* 90* 91*  CO2 32 33* 32  CREATININE 1.66* 1.45* 1.34*   LFT  Recent Labs  04/06/15 0515 04/07/15 1035  PROT 5.3* 5.1*  AST 11* 16  ALT 6* 5*  ALKPHOS 41 54  BILITOT 0.8 0.5   PT/INR No results for input(s): LABPROT, INR in the last 72 hours. PANCREAS  Recent Labs  04/06/15 0515  LIPASE 182*         Studies/Results: Dg Chest Port 1 View  04/06/2015   CLINICAL DATA:  64 year old male with respiratory failure, shortness of breath. Initial encounter.  EXAM: PORTABLE CHEST - 1 VIEW  COMPARISON:  04/05/2015 and earlier.  FINDINGS: Portable AP semi upright view at 0440 hours. The patient is now mildly rotated to the left. Stable right IJ central line. Stable sequelae of CABG. Stable cardiac size and mediastinal contours. Increased veiling bilateral pulmonary opacity. No superimposed pneumothorax or overt edema. The diaphragm is obscured. No other confluent  opacity.  IMPRESSION: Increased bilateral pleural effusions with lower lobe collapse or consolidation. No acute pulmonary edema.   Electronically Signed   By: Odessa Fleming M.D.   On: 04/06/2015 07:19    Medications: I have reviewed the patient's current medications.  Assessment/Plan: 1. Acute Pancreatitis. Complicated by pseudocyst, and ? Liver Abcess. 2. Malnutrition  Rec: Will place ND tube for feeding and likely will repeat the CT scan on Friday to follow developing pseudocysts.   Norbert Malkin JR,Sherlene Rickel L 04/07/2015, 4:37 PM  Pager: [phone] If no answer or after hours call [phone]

## 2015-04-07 NOTE — Progress Notes (Signed)
Patient Name: [name] Date of Encounter: 04/07/2015  Primary Cardiologist: Dr. Antoine Poche   Principal Problem:   Acute on chronic combined systolic and diastolic congestive heart failure, NYHA class 3 Active Problems:   S/P CABG (coronary artery bypass graft)   NSTEMI (non-ST elevated myocardial infarction)   AKI (acute kidney injury)   Acute respiratory failure with hypoxia   Hypotension   Acute on chronic combined systolic and diastolic congestive heart failure, NYHA class 4   Elevated troponin   Respiratory distress, acute   SOB (shortness of breath)   Pressure ulcer    SUBJECTIVE  Still lethargic. Answers question as "yes or no" and goes back to sleep.  Denies CP or palpitation.   CURRENT MEDS . antiseptic oral rinse  7 mL Mouth Rinse BID  . aspirin EC  81 mg Oral Daily  . famotidine  20 mg Oral Daily  . heparin  5,000 Units Subcutaneous 3 times per day  . levETIRAcetam  500 mg Intravenous Q12H  . piperacillin-tazobactam (ZOSYN)  IV  3.375 g Intravenous 3 times per day  . ranolazine  500 mg Oral BID    OBJECTIVE  Filed Vitals:   04/06/15 1640 04/06/15 2004 04/07/15 0021 04/07/15 0413  BP: 90/58 98/66 109/67 105/66  Pulse: 76 75 70 76  Temp: 98.3 F (36.8 C) 98.4 F (36.9 C) 97.7 F (36.5 C) 98.7 F (37.1 C)  TempSrc: Axillary Oral Oral Oral  Resp: Height:      Weight:    164 lb 11.2 oz (74.707 kg)  SpO2: 97% 100% 100% 99%    Intake/Output Summary (Last 24 hours) at 04/07/15 0811 Last data filed at 04/07/15 0742  Gross per 24 hour  Intake    645 ml  Output    625 ml  Net     20 ml   Filed Weights   04/05/15 0650 04/06/15 0500 04/07/15 0413  Weight: 155 lb 13.8 oz (70.7 kg) 158 lb 1.1 oz (71.7 kg) 164 lb 11.2 oz (74.707 kg)    PHYSICAL EXAM General: chronically ill appear male in no acute distress.  Neuro: unable to assess HEENT:  Normal  Neck: RIJ in place Lungs:  Resp regular and unlabored, anterior chest CTA. Heart: RRR  no s3, s4, or murmurs. Abdomen: Soft, non-distended, BS + x 4.  Extremities: No clubbing, cyanosis or edema. DP/PT/Radials 2+ and equal bilaterally. Compression stocking in place.   Accessory Clinical Findings  CBC  Recent Labs  04/05/15 0536 04/06/15 0515  WBC 14.6* 14.7*  HGB 10.7* 9.1*  HCT 31.5* 27.4*  MCV 99.4 100.0  PLT 194 154   Basic Metabolic Panel  Recent Labs  04/05/15 0536 04/06/15 0515  NA 130* 132*  K 3.7 3.3*  CL 88* 90*  CO2 32 33*  GLUCOSE 96 100*  BUN 33* 33*  CREATININE 1.66* 1.45*  CALCIUM 7.9* 8.6*   Liver Function Tests  Recent Labs  04/04/15 1222 04/06/15 0515  AST 15 11*  ALT 6* 6*  ALKPHOS 50 41  BILITOT 1.0 0.8  PROT 6.1* 5.3*  ALBUMIN 2.1* 1.5*    Recent Labs  04/04/15 1222 04/06/15 0515  LIPASE 62* 182*  AMYLASE 125*  --    Cardiac Enzymes  Recent Labs  04/05/15 1125  TROPONINI 0.05*    TELE  NSR with HR 70-80s    Echo  04/06/2015  LV EF: 20% -  25%  ------------------------------------------------------------------- Indications:   Cardiomyopathy -  ischemic 414.8.  ------------------------------------------------------------------- History:  PMH: 4cm Liver Abscess. Abdominal Aortic Aneurysm. Elevated Troponin. NSTEMI. Altered mental status. Coronary artery disease. Risk factors: Current tobacco use.  ------------------------------------------------------------------- Study Conclusions  - Left ventricle: The cavity size was normal. Systolic function was severely reduced. The estimated ejection fraction was in the range of 20% to 25%. Diffuse hypokinesis. There is akinesis of the mid-apicalanteroseptal, lateral, inferior, and inferoseptal myocardium. Doppler parameters are consistent with abnormal left ventricular relaxation (grade 1 diastolic dysfunction). - Mitral valve: Calcified annulus. There was mild regurgitation. - Pulmonic valve: There was moderate  regurgitation.  Impressions:  - EF is reduced when compared to prior echocardiogram.   ASSESSMENT AND PLAN  48M with CAD s/p CABG/prior MI, chronic combined CHF EF 40-45% (02/21/15), prior refusal of ICD, seizure disorder, COPD, 4.8cm AAA (04/04/15) admitted with CP and SOB. He had 3 recent admissions for pancreatitis, AMS, and PNA/UTI. Hypoxic and hypotensive on arrival with increased LEE, treated for acute on chronic CHF. Aslo required IV dopamine for renal perfusion. Cath 02/26/15 with severe ICM EF 15%, diffuse disease but no PCI options that would make sense to improve cardiac output - had moderate disease in the vein graft to the RCA system likely not flow-limiting. PCI on this vessel would be high risk given his low EF and vein graft being one of 2 remaining conduits. Over the weekend developed stomach upset, anorexia and hiccups. CT abd showed signs of pancreatitis with a large pseudocyst, peritonitis, colitis, and 4 cm liver abscess. Also with protein calorie malnutrition, hyopnatremia.  1. Acute on chronic systolic HF 2. Persistent hypotension, related to cardiogenic and septic shock related to pancreatitis 3. Acute on chronic pancreatitis with developing peudocyst, liver abscess and peritonitis 4. Acute renal insufficiency 5. Mild anemia without acute blood loss 6. Seizure d/o - VPA changed to levetiracetam by PCCM for concern for pancreatitis contributor 7. NSTEMI earlier this admission/CAD with no revascularizable lesions suitable for PCI 8. Aortic ulcerations, not currently a good anticoag candidate due to ongoing GI issues   Echo 6/14 showed EF of 20-25%, improved from cath less than 15%. However significant down from 40-45% in 02/22/15. Bp improved but still remains hypotensive. Held antihypertensive regimen. GI recommended enteral feeding, I think he will benefit from it. Plan to repeat CT in 3 days and my consider drainage. Supplement given for hypokalemia yesterday. Will check  electrolytes today.     Lorelei Pont PA-C Pager: [phone]   Patient seen and examined. Agree with assessment and plan. BP improved today. No chest pain. Markedly hypoalbuminemic. Enteral feeding per GI. Cr and hyponatremia slightly improved today. Pt has significant PTSD according to family.   Lennette Bihari, MD, [facility] 04/07/2015 1:24 PM

## 2015-04-07 NOTE — Progress Notes (Signed)
Will change the famotidine to protonix

## 2015-04-08 ENCOUNTER — Inpatient Hospital Stay (HOSPITAL_COMMUNITY): Payer: Medicare Other

## 2015-04-08 DIAGNOSIS — K859 Acute pancreatitis, unspecified: Secondary | ICD-10-CM

## 2015-04-08 LAB — COMPREHENSIVE METABOLIC PANEL
ALBUMIN: 1.4 g/dL — AB (ref 3.5–5.0)
ALK PHOS: 73 U/L (ref 38–126)
ALT: 6 U/L — ABNORMAL LOW (ref 17–63)
AST: 19 U/L (ref 15–41)
Anion gap: 8 (ref 5–15)
BILIRUBIN TOTAL: 0.7 mg/dL (ref 0.3–1.2)
BUN: 25 mg/dL — ABNORMAL HIGH (ref 6–20)
CHLORIDE: 91 mmol/L — AB (ref 101–111)
CO2: 30 mmol/L (ref 22–32)
Calcium: 8.2 mg/dL — ABNORMAL LOW (ref 8.9–10.3)
Creatinine, Ser: 1.2 mg/dL (ref 0.61–1.24)
GFR calc Af Amer: >60 mL/min (ref >60)
GFR calc non Af Amer: >60 mL/min (ref >60)
Glucose, Bld: 78 mg/dL (ref 65–99)
POTASSIUM: 3.6 mmol/L (ref 3.5–5.1)
SODIUM: 129 mmol/L — AB (ref 135–145)
Total Protein: 5.2 g/dL — ABNORMAL LOW (ref 6.5–8.1)

## 2015-04-08 LAB — GLUCOSE, CAPILLARY
GLUCOSE-CAPILLARY: 108 mg/dL — AB (ref 65–99)
GLUCOSE-CAPILLARY: 83 mg/dL (ref 65–99)
GLUCOSE-CAPILLARY: 94 mg/dL (ref 65–99)
Glucose-Capillary: 90 mg/dL (ref 65–99)
Glucose-Capillary: 94 mg/dL (ref 65–99)
Glucose-Capillary: 90 mg/dL (ref 65–99)

## 2015-04-08 MED ORDER — JEVITY 1.2 CAL PO LIQD
1000.0000 mL | ORAL | Status: DC
  Administered 2015-04-08: 1000 mL
  Filled 2015-04-08 (×3): qty 1000

## 2015-04-08 MED ORDER — IOHEXOL 300 MG/ML  SOLN
50.0000 mL | Freq: Once | INTRAMUSCULAR | Status: AC | PRN
  Administered 2015-04-08: 30 mL

## 2015-04-08 NOTE — Progress Notes (Addendum)
Nurse paged and reported pt having a cough and diminished lungs sound, concerning for pneumonia. I have personally seen the patient and examined him. He was laying comfortably. No cough noted while I was in room. He was placed on NG for feeding. He appeared lethargic and response to voice by opening eye and goes back to sleep.   Lung: Respiration regular and unlabored. Nasal canula in place. Limited breathing effort. CTA anteriorally. Diminished breath sound laterally. Unable to sit him up for posterior exam. Heart: RRR  CXR 04/06/15 showed Increased bilateral pleural effusions with lower lobe collapse or consolidation, no acute pulmonary edema. Abdominal x-Hunzeker showed tip of feeding tube beyond ligament of Treitz in proximal jejunum. Will get repeat cxr. I do not suspect he has pneumothorax. If significant increased in bilateral pleural effusion from previous CXR, can consider one dose of IV lasix. However he has low BP and hyponatremia. Will talk with Dr. Tresa Endo in morning. If worsening symptoms or acute concern on CXR, please page on call person. I think will need to get IM consult, as it was cancelled earlier this week.   Joy Reiger, PA-C [facility]

## 2015-04-08 NOTE — Care Management Note (Addendum)
Case Management Note  Patient Details  Name: [name] MRN: [id number] Date of Birth: 19-Jan-1951  Subjective/Objective:      Pt continues on IV zosyn- has poor po intake. NG tube in place per nursing staff.               Action/Plan: CM did discuss pt in the [facility] meeting this am.  Recommendation for LTAC to assess to see if pt is a candidate. CM did call PA with Cardiology, Vin and he is to contact Dr. Tresa Endo to see if appropriate. CM will continue to monitor for disposition needs.    Expected Discharge Date:                  Expected Discharge Plan:  Home w Home Health Services  In-House Referral:     Discharge planning Services     Post Acute Care Choice:  Resumption of Svcs/PTA Provider Choice offered to:     DME Arranged:    DME Agency:     HH Arranged:  RN, PT HH Agency:  [facility]  Status of Service:     Medicare Important Message Given:  Yes Date Medicare IM Given:  03/29/15 Medicare IM give by:  debbie dowell rn,bsn Date Additional Medicare IM Given:  04/06/15 Additional Medicare Important Message give by:  Avie Arenas, RNBSN  If discussed at Long Length of Stay Meetings, dates discussed:    Additional Comments:  Gala Lewandowsky, RN 04/08/2015, 11:46 AM

## 2015-04-08 NOTE — Progress Notes (Signed)
Physical Therapy Treatment Patient Details Name: [name] MRN: [id number] DOB: 1951/07/10 Today's Date: 2015-04-14    History of Present Illness Patient is a 64 yo male admitted 03/26/15 with SOB and tachycardia.  Patient with CHF exacerbation and NSTEMI.  Cardiac cath 6/6. Pt with acute pancreatitis complicated by pseudocyst.  PMH:  ICM, EF 40-45%, CAD, CABG, CHF, COPD, PTSD, TBI, seizures, back pain    PT Comments    Pt lethargic but able to arouse for session with verbal/tactile cues. Mobility very poor and activity tolerance low.  Follow Up Recommendations  LTACH     Equipment Recommendations   (to be determined)    Recommendations for Other Services OT consult     Precautions / Restrictions Precautions Precautions: Fall Restrictions Weight Bearing Restrictions: No    Mobility  Bed Mobility Overal bed mobility: Needs Assistance Bed Mobility: Supine to Sit     Supine to sit: HOB elevated;Total assist Sit to supine: Total assist;+2 for physical assistance   General bed mobility comments: Assist for all aspects  Transfers                    Ambulation/Gait                 Stairs            Wheelchair Mobility    Modified Rankin (Stroke Patients Only)       Balance Overall balance assessment: Needs assistance Sitting-balance support: Bilateral upper extremity supported;Feet supported Sitting balance-Leahy Scale: Poor Sitting balance - Comments: Sat EOB x 8 minutes with min to mod A Postural control: Posterior lean                          Cognition Arousal/Alertness: Lethargic Behavior During Therapy: Flat affect Overall Cognitive Status: Impaired/Different from baseline Area of Impairment: Safety/judgement   Current Attention Level: Sustained   Following Commands: Follows one step commands with increased time;Follows one step commands inconsistently Safety/Judgement: Decreased awareness of deficits Awareness:  Intellectual;Emergent Problem Solving: Slow processing;Difficulty sequencing;Requires verbal cues;Requires tactile cues;Decreased initiation      Exercises      General Comments        Pertinent Vitals/Pain Pain Assessment: No/denies pain Faces Pain Scale: No hurt    Home Living                      Prior Function            PT Goals (current goals can now be found in the care plan section) Acute Rehab PT Goals Patient Stated Goal: to go home PT Goal Formulation: With patient Time For Goal Achievement: 04/04/15 Potential to Achieve Goals: Good Progress towards PT goals: Not progressing toward goals - comment (lethargic)    Frequency  Min 2X/week    PT Plan Discharge plan needs to be updated;Frequency needs to be updated    Co-evaluation             End of Session Equipment Utilized During Treatment: Oxygen Activity Tolerance: Patient limited by lethargy Patient left: with call bell/phone within reach;in bed;with family/visitor present     Time: 1510-1526 PT Time Calculation (min) (ACUTE ONLY): 16 min  Charges:  $Therapeutic Activity: 8-22 mins                    G Codes:      Dylin Breeden 04-14-2015, 3:43 PM  [facility] PT  [phone]

## 2015-04-08 NOTE — Progress Notes (Signed)
10Fr straight catheter used for in/out bladder catheterization, amber urine with blood clots output. Patient feels no urge to void before or after catheterization.

## 2015-04-08 NOTE — Progress Notes (Signed)
Patient Name: [name] Date of Encounter: 04/08/2015  Primary Cardiologist: Dr. Antoine Poche   Principal Problem:   Acute on chronic combined systolic and diastolic congestive heart failure, NYHA class 3 Active Problems:   S/P CABG (coronary artery bypass graft)   NSTEMI (non-ST elevated myocardial infarction)   AKI (acute kidney injury)   Acute respiratory failure with hypoxia   Hypotension   Acute on chronic combined systolic and diastolic congestive heart failure, NYHA class 4   Elevated troponin   Respiratory distress, acute   SOB (shortness of breath)   Pressure ulcer    SUBJECTIVE  Still lethargic. Opens eyes to voice and goes back to sleep.   CURRENT MEDS . antiseptic oral rinse  7 mL Mouth Rinse BID  . aspirin EC  81 mg Oral Daily  . heparin  5,000 Units Subcutaneous 3 times per day  . levETIRAcetam  500 mg Intravenous Q12H  . pantoprazole  40 mg Oral BID AC  . piperacillin-tazobactam (ZOSYN)  IV  3.375 g Intravenous 3 times per day  . ranolazine  500 mg Oral BID    OBJECTIVE  Filed Vitals:   04/07/15 1730 04/07/15 2120 04/08/15 0000 04/08/15 0443  BP: 98/65 92/62 96/64  99/64  Pulse: 68 68 71 78  Temp: 98.1 F (36.7 C) 97.8 F (36.6 C) 98.4 F (36.9 C) 98.6 F (37 C)  TempSrc: Axillary Axillary Oral Axillary  Resp: 23 23 26 20   Height:      Weight:    169 lb 9.6 oz (76.93 kg)  SpO2: 100% 100% 98% 98%    Intake/Output Summary (Last 24 hours) at 04/08/15 1146 Last data filed at 04/08/15 0045  Gross per 24 hour  Intake    360 ml  Output    875 ml  Net   -515 ml   Filed Weights   04/06/15 0500 04/07/15 0413 04/08/15 0443  Weight: 158 lb 1.1 oz (71.7 kg) 164 lb 11.2 oz (74.707 kg) 169 lb 9.6 oz (76.93 kg)    PHYSICAL EXAM General: chronically ill appear male in no acute distress.  Neuro: unable to assess HEENT:  Normal  Neck: RIJ in place Lungs:  Resp regular and unlabored, anterior chest CTA. Heart: RRR no s3, s4, or murmurs. Abdomen:  Soft, non-distended, BS + x 4.  Extremities: No clubbing, cyanosis or edema. DP/PT/Radials 2+ and equal bilaterally. Compression stocking in place.   Accessory Clinical Findings  CBC  Recent Labs  04/06/15 0515  WBC 14.7*  HGB 9.1*  HCT 27.4*  MCV 100.0  PLT 154   Basic Metabolic Panel  Recent Labs  04/06/15 0515 04/07/15 1035  NA 132* 130*  K 3.3* 3.4*  CL 90* 91*  CO2 33* 32  GLUCOSE 100* 93  BUN 33* 31*  CREATININE 1.45* 1.34*  CALCIUM 8.6* 8.3*   Liver Function Tests  Recent Labs  04/06/15 0515 04/07/15 1035  AST 11* 16  ALT 6* 5*  ALKPHOS 41 54  BILITOT 0.8 0.5  PROT 5.3* 5.1*  ALBUMIN 1.5* 1.4*    Recent Labs  04/06/15 0515  LIPASE 182*   Cardiac Enzymes No results for input(s): CKTOTAL, CKMB, CKMBINDEX, TROPONINI in the last 72 hours.  TELE  NSR with HR 70-80s with occasional PVCs.   Echo  04/06/2015  LV EF: 20% -  25%  ------------------------------------------------------------------- Indications:   Cardiomyopathy - ischemic 414.8.  ------------------------------------------------------------------- History:  PMH: 4cm Liver Abscess. Abdominal Aortic Aneurysm. Elevated Troponin. NSTEMI. Altered mental status.  Coronary artery disease. Risk factors: Current tobacco use.  ------------------------------------------------------------------- Study Conclusions  - Left ventricle: The cavity size was normal. Systolic function was severely reduced. The estimated ejection fraction was in the range of 20% to 25%. Diffuse hypokinesis. There is akinesis of the mid-apicalanteroseptal, lateral, inferior, and inferoseptal myocardium. Doppler parameters are consistent with abnormal left ventricular relaxation (grade 1 diastolic dysfunction). - Mitral valve: Calcified annulus. There was mild regurgitation. - Pulmonic valve: There was moderate regurgitation.  Impressions:  - EF is reduced when compared to prior  echocardiogram.   ASSESSMENT AND PLAN  69M with CAD s/p CABG/prior MI, chronic combined CHF EF 40-45% (02/21/15), prior refusal of ICD, seizure disorder, COPD, 4.8cm AAA (04/04/15) admitted with CP and SOB. He had 3 recent admissions for pancreatitis, AMS, and PNA/UTI. Hypoxic and hypotensive on arrival with increased LEE, treated for acute on chronic CHF. Aslo required IV dopamine for renal perfusion. Cath 02/26/15 with severe ICM EF 15%, diffuse disease but no PCI options that would make sense to improve cardiac output - had moderate disease in the vein graft to the RCA system likely not flow-limiting. PCI on this vessel would be high risk given his low EF and vein graft being one of 2 remaining conduits. Over the weekend developed stomach upset, anorexia and hiccups. CT abd showed signs of pancreatitis with a large pseudocyst, peritonitis, colitis, and 4 cm liver abscess. Also with protein calorie malnutrition, hyopnatremia.  1. Acute on chronic systolic HF 2. Persistent hypotension, related to cardiogenic and septic shock related to pancreatitis 3. Acute on chronic pancreatitis with developing peudocyst, liver abscess and peritonitis 4. Acute renal insufficiency 5. Mild anemia without acute blood loss 6. Seizure d/o - VPA changed to levetiracetam by PCCM for concern for pancreatitis contributor 7. NSTEMI earlier this admission/CAD with no revascularizable lesions suitable for PCI 8. Aortic ulcerations, not currently a good anticoag candidate due to ongoing GI issues 9. hypoalbuminemic  - Echo 6/14 showed EF of 20-25%, improved from cath less than 15%. However significant down from 40-45% in 02/22/15. BP improved but still remains hypotensive. Held antihypertensive regimen. - GI planning to place him on a ND for feeding and likely repeat CT tomorrow. Mild worsen hyponatremia. K of 3.4. Creatinine improved to 1.34. Hopefully electrolytes improves from enteral feeding. Changed famotidine to Protonix.  Consider supplement for potassium.   Leonia Reeves PA-C Pager: [phone]   Patient seen and examined. Agree with assessment and plan. Remains lethargic. Enteral nutrition to start. BP remains low limiting aggressive medical therapy management for cardiomyopathy. Sinus rhythm. F/u K.   Lennette Bihari, MD, [facility] 04/08/2015 11:46 AM

## 2015-04-08 NOTE — Progress Notes (Signed)
Pt voided in bed. Changed sheets and asked pt to call nurse to use urinal since pt does not like condom catheter. Will frequently check on pt and offer assistance with urinal. Bladder scan was but pt did not feel the urge to use urinal yet.

## 2015-04-08 NOTE — Progress Notes (Signed)
[facility] PROGRESS NOTE Subjective Pt lethargic taking some clears tube in jejunum  Objective: Vital signs in last 24 hours: Temp:  [97.8 F (36.6 C)-98.6 F (37 C)] 98.6 F (37 C) (06/16 0443) Pulse Rate:  [68-78] 74 (06/16 1639) Resp:  [18-26] 18 (06/16 1639) BP: (92-103)/(62-67) 103/67 mmHg (06/16 1639) SpO2:  [96 %-100 %] 96 % (06/16 1639) Weight:  [76.93 kg (169 lb 9.6 oz)] 76.93 kg (169 lb 9.6 oz) (06/16 0443) Last BM Date:  (PTA)  Intake/Output from previous day: 06/15 0701 - 06/16 0700 In: 600 [P.O.:600] Out: 875 [Urine:875] Intake/Output this shift:    PE: General--responsive, denies pain  Abdomen--nondistended, good BSs  Lab Results:  Recent Labs  04/06/15 0515  WBC 14.7*  HGB 9.1*  HCT 27.4*  PLT 154   BMET  Recent Labs  04/06/15 0515 04/07/15 1035 04/08/15 1210  NA 132* 130* 129*  K 3.3* 3.4* 3.6  CL 90* 91* 91*  CO2 33* 32 30  CREATININE 1.45* 1.34* 1.20   LFT  Recent Labs  04/06/15 0515 04/07/15 1035 04/08/15 1210  PROT 5.3* 5.1* 5.2*  AST 11* 16 19  ALT 6* 5* 6*  ALKPHOS 41 54 73  BILITOT 0.8 0.5 0.7   PT/INR No results for input(s): LABPROT, INR in the last 72 hours. PANCREAS  Recent Labs  04/06/15 0515  LIPASE 182*         Studies/Results: Dg Abd 1 View  04/08/2015   CLINICAL DATA:  Feeding tube placement  EXAM: ABDOMEN - 1 VIEW  COMPARISON:  04/03/2015  FINDINGS: Digital C-arm fluoroscopic image was obtained following contrast injection after placement of feeding tube beyond ligament of Treitz.  Contrast opacifies proximal jejunum compatible with placement of the tip of the feeding tube beyond ligament of Treitz.  IMPRESSION: Tip of feeding tube beyond ligament of Treitz in proximal jejunum.   Electronically Signed   By: Ulyses Southward M.D.   On: 04/08/2015 09:19   Dg Chest Port 1 View  04/08/2015   CLINICAL DATA:  Pleural effusions. Acute on chronic congestive heart failure. Previous myocardial infarct.  Acute kidney injury. Acute respiratory failure with hypoxia.  EXAM: PORTABLE CHEST - 1 VIEW  COMPARISON:  04/06/2015  FINDINGS: Moderate bilateral pleural effusions and bilateral mid and lower lung atelectasis or airspace disease shows no significant change. Cardiomegaly stable. Prior CABG again noted.  Right jugular central venous catheter remains in appropriate position. A new feeding tube is seen entering the stomach.  IMPRESSION: No significant change in moderate bilateral pleural effusions and bilateral mid and lower lung atelectasis versus airspace disease.  Stable cardiomegaly.   Electronically Signed   By: Myles Rosenthal M.D.   On: 04/08/2015 16:46   Dg Vangie Bicker G Tube Plc W/fl-no Rad  04/08/2015   CLINICAL DATA:    NASO G TUBE PLACEMENT WITH FLUORO  Fluoroscopy was utilized by the requesting physician.  No radiographic  interpretation.     Medications: I have reviewed the patient's current medications.  Assessment/Plan: 1. Acute Pancreatitis.Pseudocysts forming ? Liver abcess 2. Malnutrition  Will begin TF with jevity and still allow clears, repeat CT tomorrow(5 days since last one) to follow pseudocysts  Kaoru Rezendes JR,Tymika Grilli L 04/08/2015, 5:19 PM  Pager: [phone] If no answer or after hours call [phone]

## 2015-04-09 ENCOUNTER — Encounter (HOSPITAL_COMMUNITY): Payer: Self-pay

## 2015-04-09 ENCOUNTER — Other Ambulatory Visit (HOSPITAL_COMMUNITY): Payer: Self-pay

## 2015-04-09 ENCOUNTER — Inpatient Hospital Stay (HOSPITAL_COMMUNITY): Payer: Medicare Other

## 2015-04-09 ENCOUNTER — Inpatient Hospital Stay
Admission: AD | Admit: 2015-04-09 | Discharge: 2015-04-23 | Disposition: E | Payer: Self-pay | Source: Ambulatory Visit | Attending: Internal Medicine | Admitting: Internal Medicine

## 2015-04-09 DIAGNOSIS — N19 Unspecified kidney failure: Secondary | ICD-10-CM

## 2015-04-09 DIAGNOSIS — R079 Chest pain, unspecified: Secondary | ICD-10-CM

## 2015-04-09 DIAGNOSIS — R7989 Other specified abnormal findings of blood chemistry: Secondary | ICD-10-CM

## 2015-04-09 DIAGNOSIS — Z4659 Encounter for fitting and adjustment of other gastrointestinal appliance and device: Secondary | ICD-10-CM

## 2015-04-09 LAB — LIPID PANEL
Cholesterol: 77 mg/dL (ref 0–200)
HDL: 19 mg/dL — AB (ref >40)
LDL Cholesterol: 33 mg/dL (ref 0–99)
Total CHOL/HDL Ratio: 4.1 RATIO
Triglycerides: 127 mg/dL (ref <150)
VLDL: 25 mg/dL (ref 0–40)

## 2015-04-09 LAB — COMPREHENSIVE METABOLIC PANEL
ALBUMIN: 1.3 g/dL — AB (ref 3.5–5.0)
ALBUMIN: 1.3 g/dL — AB (ref 3.5–5.0)
ALT: 6 U/L — ABNORMAL LOW (ref 17–63)
ALT: 6 U/L — ABNORMAL LOW (ref 17–63)
ANION GAP: 6 (ref 5–15)
AST: 23 U/L (ref 15–41)
AST: 20 U/L (ref 15–41)
Alkaline Phosphatase: 75 U/L (ref 38–126)
Alkaline Phosphatase: 88 U/L (ref 38–126)
Anion gap: 8 (ref 5–15)
BUN: 20 mg/dL (ref 6–20)
BUN: 17 mg/dL (ref 6–20)
CHLORIDE: 88 mmol/L — AB (ref 101–111)
CO2: 33 mmol/L — ABNORMAL HIGH (ref 22–32)
CO2: 31 mmol/L (ref 22–32)
CREATININE: 1.2 mg/dL (ref 0.61–1.24)
Calcium: 7.9 mg/dL — ABNORMAL LOW (ref 8.9–10.3)
Calcium: 7.7 mg/dL — ABNORMAL LOW (ref 8.9–10.3)
Chloride: 91 mmol/L — ABNORMAL LOW (ref 101–111)
Creatinine, Ser: 1.14 mg/dL (ref 0.61–1.24)
GFR calc non Af Amer: >60 mL/min (ref >60)
GFR calc non Af Amer: >60 mL/min (ref >60)
Glucose, Bld: 101 mg/dL — ABNORMAL HIGH (ref 65–99)
Glucose, Bld: 100 mg/dL — ABNORMAL HIGH (ref 65–99)
POTASSIUM: 3.4 mmol/L — AB (ref 3.5–5.1)
POTASSIUM: 3.3 mmol/L — AB (ref 3.5–5.1)
Sodium: 130 mmol/L — ABNORMAL LOW (ref 135–145)
Sodium: 127 mmol/L — ABNORMAL LOW (ref 135–145)
TOTAL PROTEIN: 5 g/dL — AB (ref 6.5–8.1)
Total Bilirubin: 1 mg/dL (ref 0.3–1.2)
Total Bilirubin: 0.7 mg/dL (ref 0.3–1.2)
Total Protein: 5 g/dL — ABNORMAL LOW (ref 6.5–8.1)

## 2015-04-09 LAB — CBC WITH DIFFERENTIAL/PLATELET
BASOS ABS: 0 10*3/uL (ref 0.0–0.1)
BASOS PCT: 0 % (ref 0–1)
Basophils Absolute: 0 10*3/uL (ref 0.0–0.1)
Basophils Relative: 0 % (ref 0–1)
EOS ABS: 0.1 10*3/uL (ref 0.0–0.7)
Eosinophils Absolute: 0.1 10*3/uL (ref 0.0–0.7)
Eosinophils Relative: 1 % (ref 0–5)
Eosinophils Relative: 1 % (ref 0–5)
HCT: 23.4 % — ABNORMAL LOW (ref 39.0–52.0)
HCT: 23.8 % — ABNORMAL LOW (ref 39.0–52.0)
HEMOGLOBIN: 7.9 g/dL — AB (ref 13.0–17.0)
Hemoglobin: 8 g/dL — ABNORMAL LOW (ref 13.0–17.0)
LYMPHS PCT: 16 % (ref 12–46)
Lymphocytes Relative: 15 % (ref 12–46)
Lymphs Abs: 1.5 10*3/uL (ref 0.7–4.0)
Lymphs Abs: 1.8 10*3/uL (ref 0.7–4.0)
MCH: 34.5 pg — AB (ref 26.0–34.0)
MCH: 34 pg (ref 26.0–34.0)
MCHC: 33.8 g/dL (ref 30.0–36.0)
MCHC: 33.6 g/dL (ref 30.0–36.0)
MCV: 102.2 fL — AB (ref 78.0–100.0)
MCV: 101.3 fL — AB (ref 78.0–100.0)
Monocytes Absolute: 1.1 10*3/uL — ABNORMAL HIGH (ref 0.1–1.0)
Monocytes Absolute: 1.5 10*3/uL — ABNORMAL HIGH (ref 0.1–1.0)
Monocytes Relative: 11 % (ref 3–12)
Monocytes Relative: 13 % — ABNORMAL HIGH (ref 3–12)
NEUTROS PCT: 72 % (ref 43–77)
NEUTROS PCT: 71 % (ref 43–77)
Neutro Abs: 7.1 10*3/uL (ref 1.7–7.7)
Neutro Abs: 8.4 10*3/uL — ABNORMAL HIGH (ref 1.7–7.7)
Platelets: 170 10*3/uL (ref 150–400)
Platelets: 188 10*3/uL (ref 150–400)
RBC: 2.29 MIL/uL — AB (ref 4.22–5.81)
RBC: 2.35 MIL/uL — ABNORMAL LOW (ref 4.22–5.81)
RDW: 15.4 % (ref 11.5–15.5)
RDW: 15.2 % (ref 11.5–15.5)
WBC: 9.7 10*3/uL (ref 4.0–10.5)
WBC: 11.8 10*3/uL — ABNORMAL HIGH (ref 4.0–10.5)

## 2015-04-09 LAB — PROTIME-INR
INR: 1.3 (ref 0.00–1.49)
PROTHROMBIN TIME: 16.4 s — AB (ref 11.6–15.2)

## 2015-04-09 LAB — PROCALCITONIN: PROCALCITONIN: 0.3 ng/mL

## 2015-04-09 LAB — TROPONIN I: TROPONIN I: 0.07 ng/mL — AB (ref <0.031)

## 2015-04-09 LAB — PHOSPHORUS: Phosphorus: 1.9 mg/dL — ABNORMAL LOW (ref 2.5–4.6)

## 2015-04-09 LAB — AMYLASE: AMYLASE: 99 U/L (ref 28–100)

## 2015-04-09 LAB — TSH: TSH: 10.449 u[IU]/mL — ABNORMAL HIGH (ref 0.350–4.500)

## 2015-04-09 LAB — GLUCOSE, CAPILLARY
GLUCOSE-CAPILLARY: 102 mg/dL — AB (ref 65–99)
Glucose-Capillary: 127 mg/dL — ABNORMAL HIGH (ref 65–99)
Glucose-Capillary: 112 mg/dL — ABNORMAL HIGH (ref 65–99)

## 2015-04-09 LAB — T4, FREE: Free T4: 0.79 ng/dL (ref 0.61–1.12)

## 2015-04-09 LAB — CK TOTAL AND CKMB (NOT AT ARMC)
CK TOTAL: 8 U/L — AB (ref 49–397)
CK, MB: 5.8 ng/mL — ABNORMAL HIGH (ref 0.5–5.0)
RELATIVE INDEX: INVALID (ref 0.0–2.5)

## 2015-04-09 LAB — BRAIN NATRIURETIC PEPTIDE: B Natriuretic Peptide: 1775.6 pg/mL — ABNORMAL HIGH (ref 0.0–100.0)

## 2015-04-09 LAB — LIPASE, BLOOD
Lipase: 59 U/L — ABNORMAL HIGH (ref 22–51)
Lipase: 67 U/L — ABNORMAL HIGH (ref 22–51)

## 2015-04-09 LAB — MAGNESIUM: Magnesium: 1.9 mg/dL (ref 1.7–2.4)

## 2015-04-09 MED ORDER — LEVETIRACETAM 100 MG/ML PO SOLN
500.0000 mg | Freq: Two times a day (BID) | ORAL | Status: DC
  Administered 2015-04-09: 500 mg
  Filled 2015-04-09 (×2): qty 5

## 2015-04-09 MED ORDER — PANTOPRAZOLE SODIUM 40 MG PO TBEC: 40.0000 mg | DELAYED_RELEASE_TABLET | Freq: Two times a day (BID) | ORAL | Status: AC

## 2015-04-09 MED ORDER — NITROGLYCERIN 0.4 MG SL SUBL: 0.4000 mg | SUBLINGUAL_TABLET | SUBLINGUAL | Status: AC | PRN

## 2015-04-09 MED ORDER — JEVITY 1.2 CAL PO LIQD: 1000.0000 mL | ORAL | Status: AC

## 2015-04-09 MED ORDER — PIPERACILLIN-TAZOBACTAM 3.375 G IVPB: 3.3750 g | Freq: Three times a day (TID) | INTRAVENOUS | Status: AC

## 2015-04-09 MED ORDER — JEVITY 1.2 CAL PO LIQD
1000.0000 mL | ORAL | Status: DC
Start: 2015-04-09 — End: 2015-04-09
  Administered 2015-04-09: 1000 mL
  Filled 2015-04-09 (×2): qty 1000

## 2015-04-09 MED ORDER — IOHEXOL 300 MG/ML  SOLN
25.0000 mL | INTRAMUSCULAR | Status: AC
  Administered 2015-04-09 (×2): 25 mL via ORAL

## 2015-04-09 MED ORDER — ASPIRIN 81 MG PO TBEC: 81.0000 mg | DELAYED_RELEASE_TABLET | Freq: Every day | ORAL | Status: AC

## 2015-04-09 MED ORDER — PRO-STAT 64 PO LIQD: 30.0000 mL | Freq: Every day | ORAL | Status: AC

## 2015-04-09 MED ORDER — RANOLAZINE ER 500 MG PO TB12: 500.0000 mg | ORAL_TABLET | Freq: Two times a day (BID) | ORAL | Status: AC

## 2015-04-09 MED ORDER — IOHEXOL 300 MG/ML  SOLN
80.0000 mL | Freq: Once | INTRAMUSCULAR | Status: AC | PRN
  Administered 2015-04-09: 80 mL via INTRAVENOUS

## 2015-04-09 MED ORDER — PRO-STAT 64 PO LIQD
30.0000 mL | Freq: Every day | ORAL | Status: DC
  Administered 2015-04-09: 30 mL
  Filled 2015-04-09: qty 30

## 2015-04-09 MED ORDER — LEVETIRACETAM 100 MG/ML PO SOLN: 500.0000 mg | Freq: Two times a day (BID) | ORAL | Status: AC

## 2015-04-09 NOTE — Progress Notes (Signed)
Initial Nutrition Assessment  DOCUMENTATION CODES:  Severe malnutrition in context of chronic illness  INTERVENTION:  Continue TF via NGT with Jevity 1.2.  - Increase to new goal rate of 65 ml/hr, provides 1872 kcal and 87 g protein, 1264 ml free water  - Provide Prostat daily for additional 100 kcal and 15 g protein Above regimen provides 1972 kcal and 102 g protein  NUTRITION DIAGNOSIS:  Malnutrition related to chronic illness as evidenced by severe depletion of muscle mass, moderate depletion of body fat, energy intake < 75% for > or equal to 1 month.  GOAL:  Patient will meet greater than or equal to 90% of their needs  MONITOR:  PO intake, Diet advancement, Labs, Weight trends, TF tolerance, I & O's  REASON FOR ASSESSMENT:  Consult Enteral/tube feeding initiation and management  ASSESSMENT: 64 year old gentleman with a history of ischemic cardiomyopathy, recent pancreatitis, protein calorie malnutrition, posttraumatic stress disorder, coronary artery disease with prior coronary bypass grafting, refusal of AICD therapy, and recent confusion over medical therapy.  Pt has been hospitalized several times in recent months. Unable to speak to the pt due to lethargy/confusion, however previous RD assessments showed severe ongoing malnutrition and poor prolonged PO. Pt has a NG tube and TF with Jevity 1.2 @50  ml has been initiated, pt tollerating it well. Will advance to new goal rate of 65 ml/hr and will provide Prostat daily to better meet pt's nutrition needs. Pt also received full liquid diet, but per RN only consuming small sips and few bites. Will continue to monitor. Labs reviewed: Na 130, K 3.4, Glu 78 - 112, Ca 7.9   Height:  Ht Readings from Last 1 Encounters:  03/26/15 6' (1.829 m)    Weight:  Wt Readings from Last 1 Encounters:  04-19-15 159 lb (72.122 kg)    Ideal Body Weight:  81 kg  Wt Readings from Last 10 Encounters:  2015-04-19 159 lb (72.122 kg)   03/19/15 154 lb (69.854 kg)  03/10/15 149 lb 7.6 oz (67.8 kg)  02/28/15 161 lb 9.6 oz (73.3 kg)  02/26/15 159 lb 13.3 oz (72.5 kg)  02/17/15 149 lb (67.586 kg)  01/06/15 152 lb 1.6 oz (68.992 kg)  01/01/15 153 lb 12.8 oz (69.763 kg)  12/03/14 154 lb 4.8 oz (69.99 kg)  11/10/14 155 lb (70.308 kg)    BMI:  Body mass index is 21.56 kg/(m^2).  Estimated Nutritional Needs:  Kcal:  1900 - 2100  Protein:  100 - 115 g  Fluid:  2.0 L  Skin:  Wound (see comment) (multiple from pressure ulcers to moisture associated skin damage, see nursing assessment)  Diet Order:  Diet full liquid Room service appropriate?: Yes; Fluid consistency:: Thin  EDUCATION NEEDS:  Education needs no appropriate at this time   Intake/Output Summary (Last 24 hours) at 04-19-15 1151 Last data filed at 2015/04/19 0654  Gross per 24 hour  Intake  519.5 ml  Output    385 ml  Net  134.5 ml    Last BM:  6/17  Caridad Silveira A. Khamiya Varin Dietetic Intern Pager: [phone] 04-19-15 11:54 AM

## 2015-04-09 NOTE — Progress Notes (Signed)
Report called to [facility] receiving nurse

## 2015-04-09 NOTE — Care Management Note (Addendum)
Case Management Note  Patient Details  Name: LINDBERGH FELIU MRN: [id number] Date of Birth: 1950-11-07   Subjective/Objective: Pt continues on IV zosyn- has poor po intake. NG tube in place per nursing staff.    Action/Plan:  04-08-15 CM did discuss pt in the [facility] meeting this am. Recommendation for LTAC to assess to see if pt is a candidate. CM did call PA with Cardiology, Vin and he is to contact Dr. Tresa Endo to see if appropriate. CM will continue to monitor for disposition needs.    04/22/2015 CM did speak with MD Tresa Endo in reference to disposition to Ltac. MD was agreeable, however needs to see pt today. CM had spoken to pt and wife in regards to disposition needs. Pt/ wife wanted to speak to liaison for [facility]. CM did ask Liaison for [facility] to come by and speak with pt in regards to Lt[facility] benefits and what Ltac offers. Per wife this is the best plan for now. [facility] has bed available today and willing to take pt today. PA Vin to complete the d/c summary. No further needs from CM at this time.   04/08/2015 1510 Pt's wife signed paperwork for d/c to [facility]. No further needs from CM at this time.   Medicare Important Message given? YES   (If response is "NO", the following Medicare IM given date fields will be blank)   Date Medicare IM given:  03/24/2015 Medicare IM given by: Tomi Bamberger   Expected Discharge Date:                  Expected Discharge Plan:  Home w Home Health Services  In-House Referral:     Discharge planning Services     Post Acute Care Choice:  Resumption of Svcs/PTA Provider Choice offered to:     DME Arranged:    DME Agency:     HH Arranged:  RN, PT HH Agency:  [facility]  Status of Service:     Medicare Important Message Given:  Yes Date Medicare IM Given:  03/29/15 Medicare IM give by:  debbie dowell rn,bsn Date Additional Medicare IM Given:  04/06/15 Additional Medicare Important Message give by:  Avie Arenas, RNBSN  If  discussed at Long Length of Stay Meetings, dates discussed:    Additional Comments:04/15/2015  Pt will be d/c to [facility] today.   Gala Lewandowsky, RN 04/08/2015, 12:34 PM

## 2015-04-09 NOTE — Discharge Summary (Signed)
Discharge Summary   Patient ID: [name],  MRN: [id number], DOB/AGE: July 01, 1951 64 y.o.  Admit date: 03/26/2015 Discharge date: 04/25/15  Primary Care Provider: Gildardo Cranker Primary Cardiologist: J. Hochrein, M.D.  Discharge Diagnoses Principal Problem:   Acute on chronic combined systolic and diastolic congestive heart failure, NYHA class 3 Active Problems:   S/P CABG (coronary artery bypass graft)   NSTEMI (non-ST elevated myocardial infarction)   AKI (acute kidney injury)   Acute respiratory failure with hypoxia   Hypotension   Acute on chronic combined systolic and diastolic congestive heart failure, NYHA class 4   Elevated troponin   Respiratory distress, acute   SOB (shortness of breath)   Pressure ulcer   Allergies Allergies  Allergen Reactions  . Aspirin     To much aspirin make his bleed on the inside can take a small amount.  . Flomax [Tamsulosin Hcl] Swelling  . Morphine And Related Other (See Comments)    "MAKES HIM MEAN"    Procedures: Cath 03/29/15  Conclusion    1. Severe native CAD with known occlusion of the proximal RCA, mid LAD and OM1. Now also total occlusion of SVG-Diagonal-LAD 2. Prox RCA lesion, 100% stenosed. No change from previously known disease: Ost LAD lesion, 95% stenosed. Mid LAD lesion, 100% stenosed. 1st Mrg lesion, 100% stenosed. Prox Graft lesion, between Dist RCA and RPDA, 100% stenosed. 3. Origin to Dist Graft lesion, before 2nd Diag, 100% stenosed. 4. Prox Graft to Mid Graft lesion, before Acute Mrg, 55% stenosed. 5. 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 Global and focal hypokinesis. LVEDP of initial catheterization was extremely low, after 250 mL bolus increased from 5 mmHg to 25 mm Hg  Severe ischemic dilated cardiomyopathy. No PCI options that would make sense to improve the patient's cardiac output. Moderate disease in the vein graft to the RCA system is likely not flow-limiting. PCI on this  vessel would be high risk given his low EF and vein graft being one of 2 remaining conduits   Plan:  Return to CCU/TCU for ongoing care.  Standard post radial TR band removal  Continue dopamine drip for now as he is hypotensive.   Continue aggressive management of severe ischemic cardiomyopathy. Unfortunately we are limited by blood pressures. Consider palliative care consultation.   Echo 04/06/2015  LV EF: 20% -  25%  ------------------------------------------------------------------- Indications:   Cardiomyopathy - ischemic 414.8.  ------------------------------------------------------------------- History:  PMH: 4cm Liver Abscess. Abdominal Aortic Aneurysm. Elevated Troponin. NSTEMI. Altered mental status. Coronary artery disease. Risk factors: Current tobacco use.  ------------------------------------------------------------------- Study Conclusions  - Left ventricle: The cavity size was normal. Systolic function was severely reduced. The estimated ejection fraction was in the range of 20% to 25%. Diffuse hypokinesis. There is akinesis of the mid-apicalanteroseptal, lateral, inferior, and inferoseptal myocardium. Doppler parameters are consistent with abnormal left ventricular relaxation (grade 1 diastolic dysfunction). - Mitral valve: Calcified annulus. There was mild regurgitation. - Pulmonic valve: There was moderate regurgitation.  Impressions:  - EF is reduced when compared to prior echocardiogram.   History of Present Illness  64 year old gentleman with a history of ischemic cardiomyopathy, recent pancreatitis, protein calorie malnutrition, posttraumatic stress disorder, coronary artery disease with prior coronary bypass grafting, refusal of AICD therapy, and recent confusion over medical therapy. Recent hypotension requiring removal of heart failure therapy. Last EF 40-45% (02/21/15) who admitted 03/26/15 for dyspnea.   The patient was seen  by Dr. Antoine Poche on 03/19/2015. Prior to this office  visit the patient was hospitalized 3 times between April 30 and 03/09/2014. The initial admission was secondary to acute on chronic pancreatitis, followed by an admission for altered mental status and, finally an admission for hospital acquired pneumonia and urinary tract infection. During this time the patient had significant hypotension and both lisinopril and carvedilol were discontinued. Between each hospital stay, he would resume his typical heart failure therapy including the Ace inhibitor and beta blocker. Because of this severe hypotension was noted as an outpatient. On the 03/19/2015 visit with Dr. Antoine Poche lisinopril was discontinued. 4 days later carvedilol was discontinued after it was identified that the patient was still taking the medication.  After the visit with Dr. Antoine Poche, the patient stated that he has been taking increasing quantities of energy drinks. Last evening and again this morning prior to recent admission 03/26/15 he developed sudden shortness of breath. He was in found to be in extreme distress by his wife and he was brought to the emergency room, was hypoxic, required BiPAP, and improved after IV Lasix. At the time of the exam by cardiologist the patient is speaking coherently. Heart rate is decreased into the low 80s, skin is warm and dry, and his neck veins are flat. Associated with the dyspnea was a right parasternal focal chest discomfort. The discomfort was resolved in ED. Both the patient and wife requested we not involve the [facility] (their plan is to switch to another primary care provider).  Hospital Course  03/27/15: patient felt better with mild CP pain. Placed on NGT patch. His troponin elevated to 7.6. Placed on IV heparin, IV dopamine for renal perfusion. Plan made to do cath once AKI resolves due to hypoperfusion. Acute on chronic combined systolic and diastolic heart failure, likely secondary to  excessive fluid intake.  03/28/15: felt better, no cp. Creat has improved to 1.32. Plan to continue dopamine until cath. Plan made to get [facility] help if other medical issues arise.   03/29/15: No chest pain. Cr improved to 1.24. Cath showed severe ischemic dilated cardiomyopathy; moderate disease in the vein graft to the RCA system is likely not flow-limiting. PCI on this vessel would be high risk given his low EF and vein graft being one of 2 remaining conduits; Unfortunately we are limited by blood pressures. no revascularizeablelesions.   03/30/15: kidney function improved, d/c dopamine. EDP 14 post A wave 25 at cath, restarted lisinopril 2.5mg . No chest pain.   03/31/15: remained hypotensive. Held Lisinopril. Continueed with PT/OT OOB. Medical Rx. CHF limited again by low BP. Defered medicine and rehabilitation consult due to poor endurence/hypotension, felt he would not able to tolerate higher levels of therapy or CIR.   04/01/15: Felt stronger. No chest pain, dyspnea improved. Continue with PT/OT OOB. Plan to continue home health service and discharged next day.  04/02/15: developed nausea and vomiting, unknown reason, PRN zofran added. Exam was benign except for palpable AAA, which is old 4.8cm. Later in a day, noted decreased appetite, complained  specific abdominal pain and found guarding abdomen on exam. Held CT abdomen. Lipase noted 106.  04/03/15: Continued to have abdominal pain. Taking PO, had BM. Continued Zofran. KUB with nonobstructive gas pattern.  6/12//16: Abdominal pain continued. Taking PO. Lipase improved to 62. GI consult - noncontrast abd/pelvis CT to look for small bowel obstruction or pancreatitis; If unrevealing, then may need an EGD to look for peptic ulcer disease or gastric outlet obstruction. CT showed pancreatitis with a large pseudocyst, peritonitis, colitis,  and 4 cm liver abscess.  04/05/15:  Patient continued to remained hypotensive. Continued to have  vomiting. GI felt, will likely need drainage when abscess are more mature  Given fluid bolus and peripheral dopa for hypotension and lasix was held, however patient continued to remained hypotensive. Placed R IJ for invasive CVP monitoring. Placed on vanc/zosyn and blood culture were ordered. Transferred to ICU. PCCM consulted - pressor therapy to keep MAP >65.  Weaned off dopamine   04/06/15: Patient found to be in lethargic and mumbling. Continued full liquids. Received wide open IVF overnight. He was tachycardic and hypotensive but HR and BP have improved (HR now ~100, BP 99/61). Remained off dopamine. Renal function improved. Transfer to SDU. Advance diet. Echo showed LVEF 20-25%, diffuse hypokinesis, akinesis of the mid-apicalanteroseptal, lateral, inferior, and inferoseptal myocardium. EF improved from cath, less than 15%. However significant down from 40-45% in 02/22/15. Of PCCM note, discussed goals of care with pt's wife. She indicates that these matters have been clearly discussed btw her and pt in the past. Further, she and pt met with Dr Debby Bud last hospitalization. She indicates that pt would not wish to be sustained on life support beyond a few days and would not wish to undergo ACLS or intubation if there is no hope of meaningful, functional recovery. For now,  left him as full code but assured her that we would comply with these wishes. Noted hypokalemia and hyponatremia. Transfered to 3W unit.   04/07/15: Remained lethargic.Bp improved but still remains hypotensive. Held antihypertensive regimen. Hyponatremia slightly improved today. change the famotidine to Protonix. Placed NG tube for feeding. Foley removed at 1500, condom cath applied, no urine output since then, bladder scan reveals .  6/ 16/16: pt voided in bed, bladder scan was . Still lethargic. Opens eyes to voice and goes back to sleep. Mild worsen hyponatremia. K of 3.4, supplement given. Creatinine improved to 1.34. Began TF  with jevity. CM recommended long term acute care (LTAC). Patient developed cough, repeat CXR showed no significant change in moderate bilateral pleural effusions and bilateral mid and lower lung atelectasis versus airspace disease. Stable cardiomegaly  04/22/2015: Remained lethargic. Plan made to discharge patient toLTAC today for acute management. GI will f/u him at [facility]. Noted hemoglobin of 7.9 today, was 9.1 yesterday, and 10.7 on 6/13. However was 8.7 on 03/28/15. Will get stool guaiac. Creatinine improved to 1.2. Please let [facility] know when patient is about to discharge from LTAC so we can schedule a f/u appointment.  Discharge Vitals Blood pressure 107/60, pulse 86, temperature 99.2 F (37.3 C), temperature source Oral, resp. rate 23, height 6' (1.829 m), weight 159 lb (72.122 kg), SpO2 97 %. / Filed Weights   04/07/15 0413 04/08/15 0443 03/27/2015 0353  Weight: 164 lb 11.2 oz (74.707 kg) 169 lb 9.6 oz (76.93 kg) 159 lb (72.122 kg)     CBC  Recent Labs  04/03/2015 0430  WBC 9.7  NEUTROABS 7.1  HGB 7.9*  HCT 23.4*  MCV 102.2*  PLT 170   Basic Metabolic Panel  Recent Labs  04/08/15 1210 03/30/2015 0430  NA 129* 130*  K 3.6 3.4*  CL 91* 91*  CO2 30 33*  GLUCOSE 78 101*  BUN 25* 20  CREATININE 1.20 1.20  CALCIUM 8.2* 7.9*   Liver Function Tests  Recent Labs  04/08/15 1210 04/13/2015 0430  AST 19 23  ALT 6* 6*  ALKPHOS 73 75  BILITOT 0.7 1.0  PROT 5.2* 5.0*  ALBUMIN  1.4* 1.3*    Recent Labs  04/04/2015 0430  LIPASE 59*   Disposition  Pt is being discharged home today in good condition.  Follow-up Plans & Appointments    Discharge Instructions    Diet - low sodium heart healthy    Complete by:  As directed      Discharge instructions    Complete by:  As directed   LTAC team please let [facility] know when patient is about to discharge so we can schedule a f/u appointment.   OR call [address] office [phone] to make appointment     Increase  activity slowly    Complete by:  As directed             Discharge Medications    Medication List    STOP taking these medications        aspirin 325 MG tablet  Replaced by:  aspirin 81 MG EC tablet     divalproex 500 MG DR tablet  Commonly known as:  DEPAKOTE     furosemide 40 MG tablet  Commonly known as:  LASIX     ranitidine 150 MG capsule  Commonly known as:  ZANTAC      TAKE these medications        aspirin 81 MG EC tablet  Take 1 tablet (81 mg total) by mouth daily.     feeding supplement (JEVITY 1.2 CAL) Liqd  Place 1,000 mLs into feeding tube continuous.     feeding supplement (PRO-STAT 64) Liqd  Place 30 mLs into feeding tube daily at 3 pm.     levETIRAcetam 100 MG/ML solution  Commonly known as:  KEPPRA  Place 5 mLs (500 mg total) into feeding tube 2 (two) times daily.     liver oil-zinc oxide 40 % ointment  Commonly known as:  DESITIN  Apply topically every morning.     nitroGLYCERIN 0.4 MG SL tablet  Commonly known as:  NITROSTAT  Place 1 tablet (0.4 mg total) under the tongue every 5 (five) minutes x 3 doses as needed for chest pain.     pantoprazole 40 MG tablet  Commonly known as:  PROTONIX  Take 1 tablet (40 mg total) by mouth 2 (two) times daily before a meal.     piperacillin-tazobactam 3.375 GM/50ML IVPB  Commonly known as:  ZOSYN  Inject 50 mLs (3.375 g total) into the vein every 8 (eight) hours.     pravastatin 40 MG tablet  Commonly known as:  PRAVACHOL  Take 1 tablet (40 mg total) by mouth daily.     ranolazine 500 MG 12 hr tablet  Commonly known as:  RANEXA  Take 1 tablet (500 mg total) by mouth 2 (two) times daily.        Duration of Discharge Encounter   Greater than 30 minutes including physician time.  Signed, Sabah Zucco PA-C 04/05/2015, 3:08 PM

## 2015-04-09 NOTE — Progress Notes (Signed)
[facility] PROGRESS NOTE Subjective patient about the same clinically. Tube feeding started and to be advanced. We will have dietitian calculate anticipated needs. CT scan to evaluate pancreatic pseudocyst formation pending later today.  Objective: Vital signs in last 24 hours: Temp:  [98 F (36.7 C)-99.2 F (37.3 C)] 99.2 F (37.3 C) (06/17 0804) Pulse Rate:  [74-86] 86 (06/17 0745) Resp:  [18-23] 23 (06/17 0350) BP: (103-109)/(60-67) 107/60 mmHg (06/17 0745) SpO2:  [96 %-99 %] 97 % (06/17 0745) Weight:  [72.122 kg (159 lb)] 72.122 kg (159 lb) (06/17 0353) Last BM Date: 04/19/2015 (small noted this am from previous nurse)  Intake/Output from previous day: 06/16 0701 - 06/17 0700 In: 519.5 [P.O.:100; NG/GT:357; IV Piggyback:62.5] Out: 385 [Urine:385] Intake/Output this shift:    PE: General-- very lethargic minimally responsive  Lungs-- course breath sounds Abdomen-- nondistended generally soft with good bowel sounds  Lab Results:  Recent Labs  04/02/2015 0430  WBC 9.7  HGB 7.9*  HCT 23.4*  PLT 170   BMET  Recent Labs  04/07/15 1035 04/08/15 1210 04/21/2015 0430  NA 130* 129* 130*  K 3.4* 3.6 3.4*  CL 91* 91* 91*  CO2 32 30 33*  CREATININE 1.34* 1.20 1.20   LFT  Recent Labs  04/07/15 1035 04/08/15 1210 04/07/2015 0430  PROT 5.1* 5.2* 5.0*  AST 16 19 23   ALT 5* 6* 6*  ALKPHOS 54 73 75  BILITOT 0.5 0.7 1.0   PT/INR No results for input(s): LABPROT, INR in the last 72 hours. PANCREAS  Recent Labs  04/06/2015 0430  LIPASE 59*         Studies/Results: Dg Abd 1 View  04/08/2015   CLINICAL DATA:  Feeding tube placement  EXAM: ABDOMEN - 1 VIEW  COMPARISON:  04/03/2015  FINDINGS: Digital C-arm fluoroscopic image was obtained following contrast injection after placement of feeding tube beyond ligament of Treitz.  Contrast opacifies proximal jejunum compatible with placement of the tip of the feeding tube beyond ligament of Treitz.   IMPRESSION: Tip of feeding tube beyond ligament of Treitz in proximal jejunum.   Electronically Signed   By: Ulyses Southward M.D.   On: 04/08/2015 09:19   Dg Chest Port 1 View  04/08/2015   CLINICAL DATA:  Pleural effusions. Acute on chronic congestive heart failure. Previous myocardial infarct. Acute kidney injury. Acute respiratory failure with hypoxia.  EXAM: PORTABLE CHEST - 1 VIEW  COMPARISON:  04/06/2015  FINDINGS: Moderate bilateral pleural effusions and bilateral mid and lower lung atelectasis or airspace disease shows no significant change. Cardiomegaly stable. Prior CABG again noted.  Right jugular central venous catheter remains in appropriate position. A new feeding tube is seen entering the stomach.  IMPRESSION: No significant change in moderate bilateral pleural effusions and bilateral mid and lower lung atelectasis versus airspace disease.  Stable cardiomegaly.   Electronically Signed   By: Myles Rosenthal M.D.   On: 04/08/2015 16:46   Dg Vangie Bicker G Tube Plc W/fl-no Rad  04/08/2015   CLINICAL DATA:    NASO G TUBE PLACEMENT WITH FLUORO  Fluoroscopy was utilized by the requesting physician.  No radiographic  interpretation.     Medications: I have reviewed the patient's current medications.  Assessment/Plan: 1. Acute on chronic pancreatitis. Patient with developing pseudocyst and possible liver abscess on antibiotics. etiology is not entirely clear. Long discussion with wife, patient is nondrinker. Could've been gallstones. Due to his severe heart disease he would not be a surgical candidate for anything other  than life-threatening situation. We will go ahead and continued tube feeding and check CT scan results.   Patirica Longshore JR,Anselm Aumiller L Apr 27, 2015, 9:33 AM  Pager: [phone] If no answer or after hours call [phone]

## 2015-04-09 NOTE — Progress Notes (Signed)
Patient Name: [name] Date of Encounter: 04/10/2015  Primary Cardiologist: Dr. Antoine Poche   Principal Problem:   Acute on chronic combined systolic and diastolic congestive heart failure, NYHA class 3 Active Problems:   S/P CABG (coronary artery bypass graft)   NSTEMI (non-ST elevated myocardial infarction)   AKI (acute kidney injury)   Acute respiratory failure with hypoxia   Hypotension   Acute on chronic combined systolic and diastolic congestive heart failure, NYHA class 4   Elevated troponin   Respiratory distress, acute   SOB (shortness of breath)   Pressure ulcer    SUBJECTIVE  Pt is lethargic. Responds to voice by opening eye and goes back to sleep. Nurse reported on overnight cough.   CURRENT MEDS . antiseptic oral rinse  7 mL Mouth Rinse BID  . aspirin EC  81 mg Oral Daily  . heparin  5,000 Units Subcutaneous 3 times per day  . iohexol  25 mL Oral Q1 Hr x 2  . levETIRAcetam  500 mg Intravenous Q12H  . pantoprazole  40 mg Oral BID AC  . piperacillin-tazobactam (ZOSYN)  IV  3.375 g Intravenous 3 times per day  . ranolazine  500 mg Oral BID    OBJECTIVE  Filed Vitals:   04/18/2015 0037 04/04/2015 0200 03/28/2015 0350 03/25/2015 0353  BP:  108/66 109/64   Pulse:  81 83   Temp: 98 F (36.7 C)  98.2 F (36.8 C)   TempSrc: Oral  Oral   Resp:  23 23   Height:      Weight:    159 lb (72.122 kg)  SpO2:  99% 98%     Intake/Output Summary (Last 24 hours) at 04/15/2015 0750 Last data filed at 03/26/2015 0654  Gross per 24 hour  Intake  519.5 ml  Output    385 ml  Net  134.5 ml   Filed Weights   04/07/15 0413 04/08/15 0443 03/24/2015 0353  Weight: 164 lb 11.2 oz (74.707 kg) 169 lb 9.6 oz (76.93 kg) 159 lb (72.122 kg)    PHYSICAL EXAM General: chronically ill appear male in no acute distress.  Neuro: unable to assess HEENT:  Normal  Neck: RIJ in place Lungs:  Resp regular and unlabored. Anterior chest exam- audible ronchi at tracheal area. Diminished breath  sound lateral chest.  Heart: RRR no s3, s4, or murmurs. Abdomen: Soft, non-distended, BS + x 4.  Extremities: No clubbing, cyanosis or edema. DP/PT/Radials 2+ and equal bilaterally. Compression stocking in place.   Accessory Clinical Findings  CBC  Recent Labs  04/17/2015 0430  WBC 9.7  NEUTROABS 7.1  HGB 7.9*  HCT 23.4*  MCV 102.2*  PLT 170   Basic Metabolic Panel  Recent Labs  04/08/15 1210 04/01/2015 0430  NA 129* 130*  K 3.6 3.4*  CL 91* 91*  CO2 30 33*  GLUCOSE 78 101*  BUN 25* 20  CREATININE 1.20 1.20  CALCIUM 8.2* 7.9*   Liver Function Tests  Recent Labs  04/08/15 1210 03/26/2015 0430  AST 19 23  ALT 6* 6*  ALKPHOS 73 75  BILITOT 0.7 1.0  PROT 5.2* 5.0*  ALBUMIN 1.4* 1.3*    Recent Labs  04/10/2015 0430  LIPASE 59*    TELE  NSR with HR 70-80s with occasional PVCs.   Echo  04/06/2015  LV EF: 20% -  25%  ------------------------------------------------------------------- Indications:   Cardiomyopathy - ischemic 414.8.  ------------------------------------------------------------------- History:  PMH: 4cm Liver Abscess. Abdominal Aortic Aneurysm. Elevated  Troponin. NSTEMI. Altered mental status. Coronary artery disease. Risk factors: Current tobacco use.  ------------------------------------------------------------------- Study Conclusions  - Left ventricle: The cavity size was normal. Systolic function was severely reduced. The estimated ejection fraction was in the range of 20% to 25%. Diffuse hypokinesis. There is akinesis of the mid-apicalanteroseptal, lateral, inferior, and inferoseptal myocardium. Doppler parameters are consistent with abnormal left ventricular relaxation (grade 1 diastolic dysfunction). - Mitral valve: Calcified annulus. There was mild regurgitation. - Pulmonic valve: There was moderate regurgitation.  Impressions:  - EF is reduced when compared to prior echocardiogram.   ASSESSMENT AND  PLAN  7M with CAD s/p CABG/prior MI, chronic combined CHF EF 40-45% (02/21/15), prior refusal of ICD, seizure disorder, COPD, 4.8cm AAA (04/04/15) admitted with CP and SOB. He had 3 recent admissions for pancreatitis, AMS, and PNA/UTI. Hypoxic and hypotensive on arrival with increased LEE, treated for acute on chronic CHF. Aslo required IV dopamine for renal perfusion. Cath 02/26/15 with severe ICM EF 15%, diffuse disease but no PCI options that would make sense to improve cardiac output - had moderate disease in the vein graft to the RCA system likely not flow-limiting. PCI on this vessel would be high risk given his low EF and vein graft being one of 2 remaining conduits. Over the weekend developed stomach upset, anorexia and hiccups. CT abd showed signs of pancreatitis with a large pseudocyst, peritonitis, colitis, and 4 cm liver abscess. Also with protein calorie malnutrition, hyopnatremia.  1. Acute on chronic systolic HF: Echo 6/14 showed EF of 20-25%, improved from cath less than 15% 6/6. However significant down from 40-45% in 02/22/15. BP improved but still remains hypotensive. Held antihypertensive regimen. - Nurse paged yesterday, concerning diminished breath sound. See Yesterday's addendum - No overnight cough per nurse. Repeat CXR 6/16 showed no significant change in moderate bilateral pleural effusions and bilateral mid and lower lung atelectasis versus airspace disease. Stable cardiomegaly. I think his lung exam worsen today. Will differ to MD for further management. Concern for ARDS.   2. Persistent hypotension, related to cardiogenic and septic shock related to pancreatitis: Improved  3. Acute on chronic pancreatitis with developing peudocyst, liver abscess and peritonitis: placed on TF with Jevity. Repeat CT today.GI following  4. Acute renal insufficiency: Creatinine 1.2. improved  5. Mild anemia without acute blood loss: nurse reported today perineal skin breakdown. Noted hemoglobin of  7.9 today, was 9.1 yesterday, and 10.7 on 6/13. However was 8.7 on 03/28/15. Will get stool guaiac.   6. Seizure d/o - VPA changed to levetiracetam by PCCM for concern for pancreatitis contributor  7. NSTEMI earlier this admission/CAD with no revascularizable lesions suitable for PCI  8. Aortic ulcerations, not currently a good anticoag candidate due to ongoing GI issues  9. Hypoalbuminemic - on feeding tube  10. Hyponatremia -  Na of 130.  Stable.   11. Hypokalemia - K of 3.4. Consider daily supplement of potassium.  Dispo:CM recommended long term acute care (LTAC). Nurse reported that Dr. Randa Evens will f/u patient if decision made to transfer patients to LTAC [facility] [address]. MD to decide. I think he need to be managed by the IM or acute care.   Signed, Bhagat,Bhavinkumar PA-C [phone]   Patient seen and examined. Agree with assessment and plan. For transfer to [facility] today, bed is available   Lennette Bihari, MD, [facility] 03/27/2015 11:41 AM

## 2015-04-11 ENCOUNTER — Other Ambulatory Visit (HOSPITAL_COMMUNITY): Payer: Self-pay

## 2015-04-11 LAB — BLOOD GAS, ARTERIAL
ACID-BASE DEFICIT: 1.4 mmol/L (ref 0.0–2.0)
BICARBONATE: 23.1 meq/L (ref 20.0–24.0)
FIO2: 1 %
O2 SAT: 91.9 %
PATIENT TEMPERATURE: 98.1
PCO2 ART: 40.9 mmHg (ref 35.0–45.0)
TCO2: 24.4 mmol/L (ref 0–100)
pH, Arterial: 7.37 (ref 7.350–7.450)
pO2, Arterial: 68.3 mmHg — ABNORMAL LOW (ref 80.0–100.0)

## 2015-04-11 LAB — CBC WITH DIFFERENTIAL/PLATELET
BASOS PCT: 0 % (ref 0–1)
Basophils Absolute: 0 10*3/uL (ref 0.0–0.1)
EOS ABS: 0.1 10*3/uL (ref 0.0–0.7)
Eosinophils Relative: 0 % (ref 0–5)
HEMATOCRIT: 25 % — AB (ref 39.0–52.0)
Hemoglobin: 8.4 g/dL — ABNORMAL LOW (ref 13.0–17.0)
LYMPHS PCT: 16 % (ref 12–46)
Lymphs Abs: 2.5 10*3/uL (ref 0.7–4.0)
MCH: 33.5 pg (ref 26.0–34.0)
MCHC: 33.6 g/dL (ref 30.0–36.0)
MCV: 99.6 fL (ref 78.0–100.0)
Monocytes Absolute: 2.6 10*3/uL — ABNORMAL HIGH (ref 0.1–1.0)
Monocytes Relative: 16 % — ABNORMAL HIGH (ref 3–12)
NEUTROS ABS: 10.9 10*3/uL — AB (ref 1.7–7.7)
NEUTROS PCT: 68 % (ref 43–77)
Platelets: 351 10*3/uL (ref 150–400)
RBC: 2.51 MIL/uL — ABNORMAL LOW (ref 4.22–5.81)
RDW: 15.4 % (ref 11.5–15.5)
WBC: 16 10*3/uL — ABNORMAL HIGH (ref 4.0–10.5)

## 2015-04-11 LAB — CULTURE, BLOOD (ROUTINE X 2)
CULTURE: NO GROWTH
CULTURE: NO GROWTH

## 2015-04-11 LAB — BASIC METABOLIC PANEL
ANION GAP: 6 (ref 5–15)
BUN: 16 mg/dL (ref 6–20)
CO2: 30 mmol/L (ref 22–32)
Calcium: 7.8 mg/dL — ABNORMAL LOW (ref 8.9–10.3)
Chloride: 94 mmol/L — ABNORMAL LOW (ref 101–111)
Creatinine, Ser: 1.09 mg/dL (ref 0.61–1.24)
Glucose, Bld: 130 mg/dL — ABNORMAL HIGH (ref 65–99)
Potassium: 3.7 mmol/L (ref 3.5–5.1)
SODIUM: 130 mmol/L — AB (ref 135–145)

## 2015-04-11 LAB — HEPARIN LEVEL (UNFRACTIONATED): Heparin Unfractionated: <0.1 IU/mL — ABNORMAL LOW (ref 0.30–0.70)

## 2015-04-11 LAB — PHOSPHORUS: Phosphorus: 2 mg/dL — ABNORMAL LOW (ref 2.5–4.6)

## 2015-04-11 LAB — MAGNESIUM: MAGNESIUM: 1.8 mg/dL (ref 1.7–2.4)

## 2015-04-11 LAB — CLOSTRIDIUM DIFFICILE BY PCR: Toxigenic C. Difficile by PCR: NEGATIVE

## 2015-04-13 ENCOUNTER — Ambulatory Visit: Payer: Medicare Other | Admitting: Primary Care

## 2015-04-13 MED FILL — Medication: Qty: 1 | Status: AC

## 2015-04-16 LAB — CULTURE, BLOOD (ROUTINE X 2)
CULTURE: NO GROWTH
Culture: NO GROWTH

## 2015-04-23 DEATH — deceased

## 2015-04-29 ENCOUNTER — Ambulatory Visit: Payer: Medicare Other | Admitting: Cardiology

## 2015-07-14 ENCOUNTER — Other Ambulatory Visit (HOSPITAL_COMMUNITY): Payer: Medicare Other

## 2015-07-14 ENCOUNTER — Ambulatory Visit: Payer: Medicare Other | Admitting: Vascular Surgery

## 2016-01-05 IMAGING — CR DG CHEST 1V PORT
1 series · 1 of 1 positions shown · non-contrast
Comparison: Chest radiograph performed 03/07/2015

CLINICAL DATA: Acute onset of shortness of breath. Initial
encounter.

EXAM:
PORTABLE CHEST - 1 VIEW

[AP]
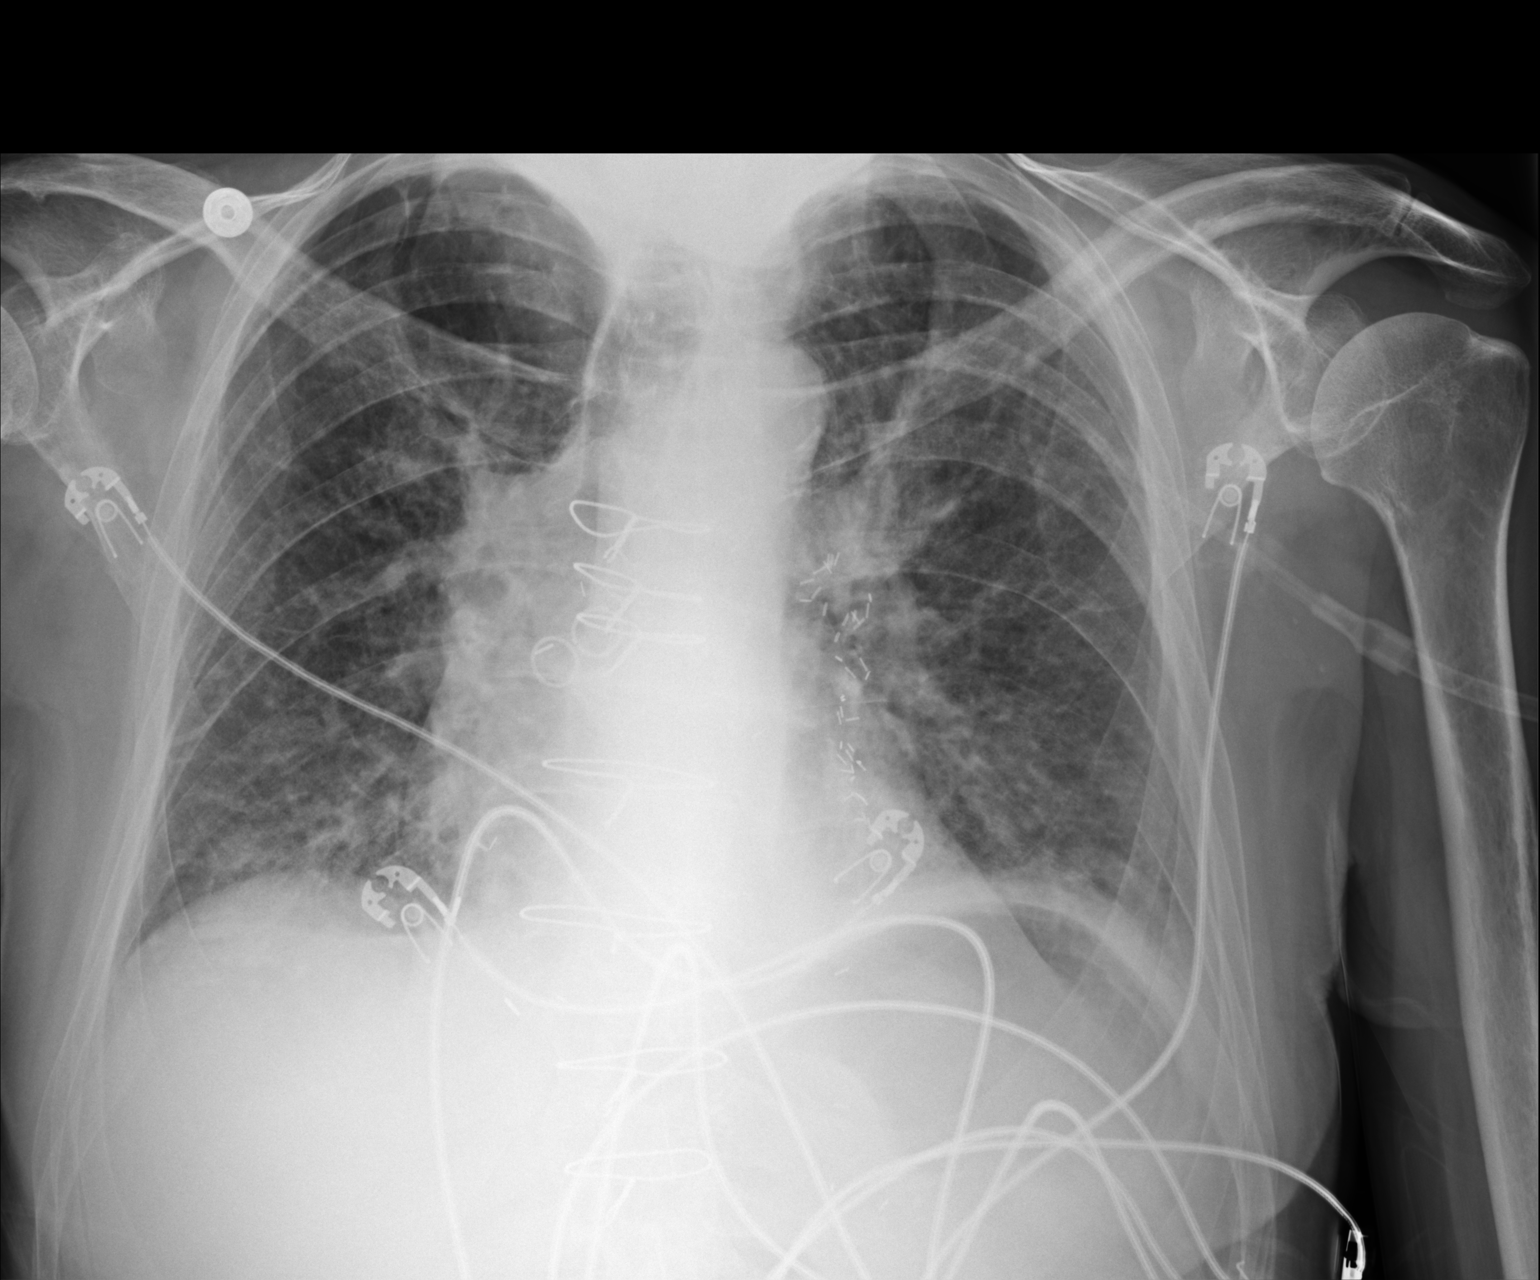

[1 of 1 positions shown; findings below may reference images not displayed]

FINDINGS: The lungs are well-aerated. Vascular congestion is noted. Mild
bibasilar opacities raise concern for mild interstitial edema. There
is no evidence of pleural effusion or pneumothorax.

The cardiomediastinal silhouette is borderline normal in size. The
patient is status post median sternotomy, with evidence of prior
CABG. No acute osseous abnormalities are seen.
IMPRESSION: Vascular congestion noted. Mild bibasilar opacities raise concern
for mild interstitial edema.

## 2016-01-19 IMAGING — CT CT ABD-PELV W/ CM
2 of 5 series · 15 of 46 positions shown, 17 images · IV contrast (APPLIED)
Comparison: 04/04/2015

CLINICAL DATA: Pneumonia, UTI, coronary artery disease,
hypertension, systolic heart failure, history pancreatitis, smoking

EXAM:
CT ABDOMEN AND PELVIS WITH CONTRAST
TECHNIQUE: Multidetector CT imaging of the abdomen and pelvis was performed
using the standard protocol following bolus administration of
intravenous contrast. Sagittal and coronal MPR images reconstructed
from axial data set.
CONTRAST:  80mL OMNIPAQUE IOHEXOL 300 MG/ML SOLN. Dilute oral
contrast.

[Series 2: abd/ pelvis 5.0 i30f 1 · axial · 0.74mm/px · z∈[-509,-24]mm · 12 of 109 slices shown, 14 images]
[im 6/109  soft-tissue]
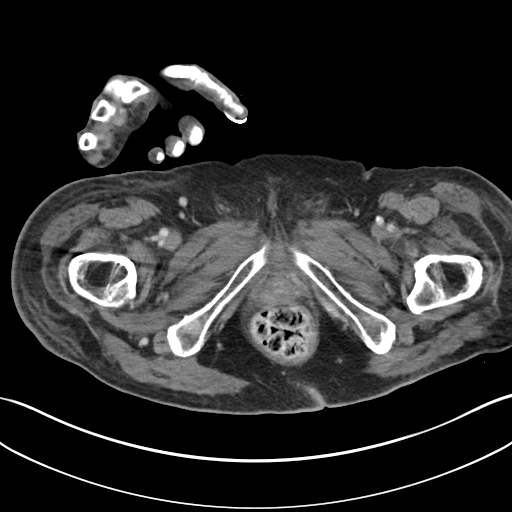
[im 6/109  bone]
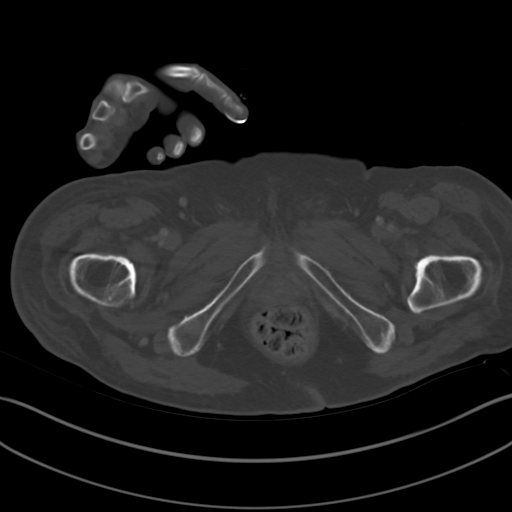
[im 17/109  soft-tissue]
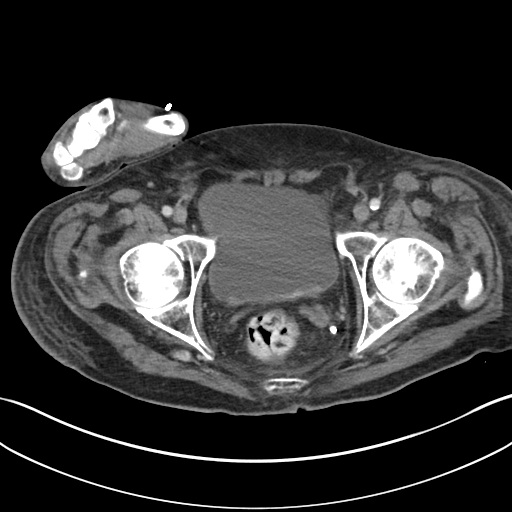
[im 22/109  soft-tissue]
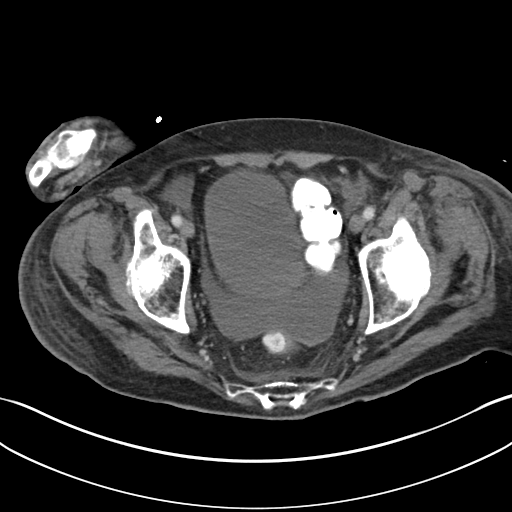
[im 33/109  soft-tissue]
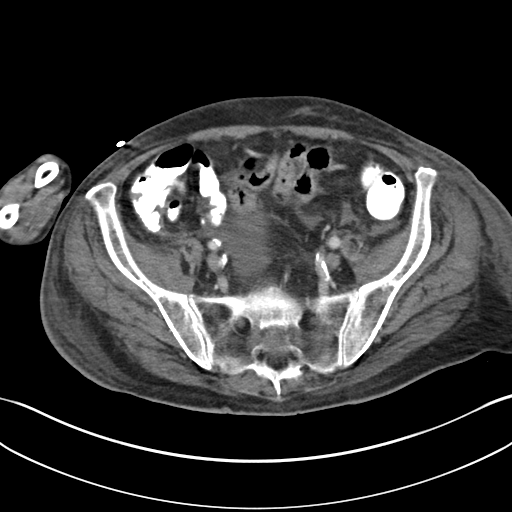
[im 44/109  soft-tissue]
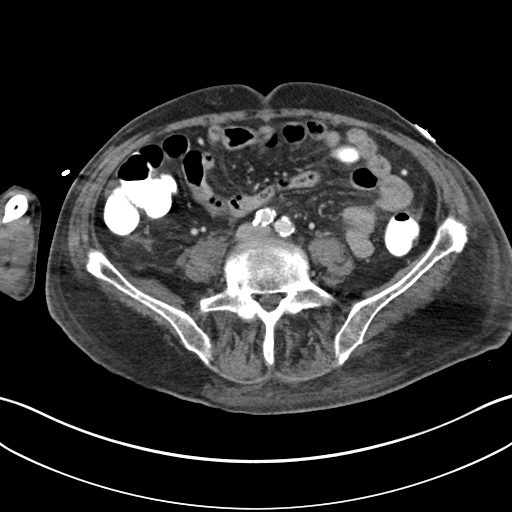
[im 49/109  soft-tissue]
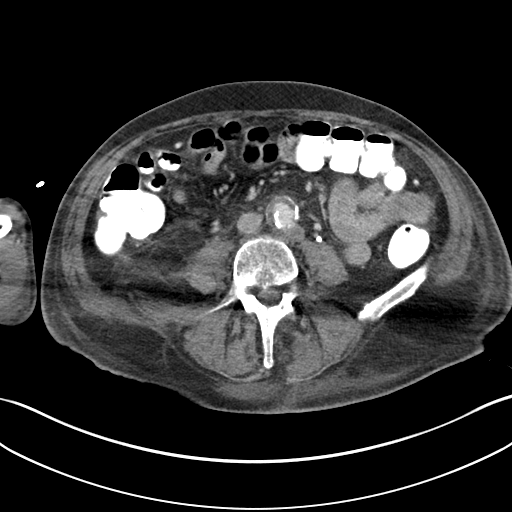
[im 60/109  soft-tissue]
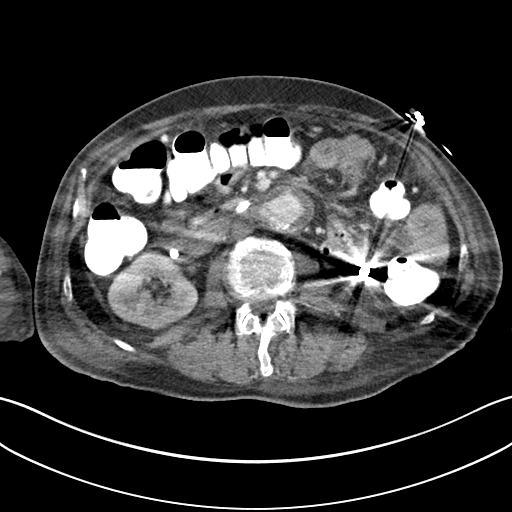
[im 65/109  soft-tissue]
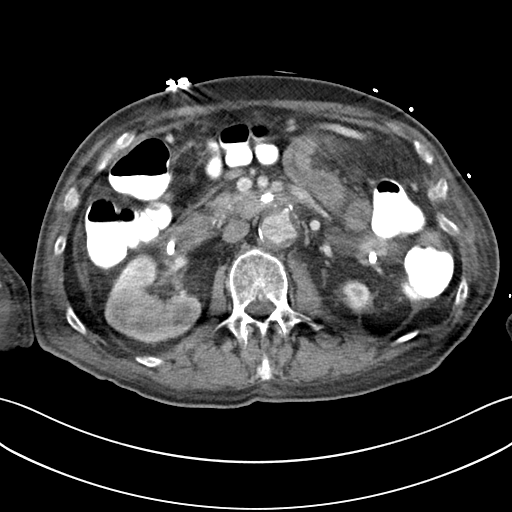
[im 76/109  soft-tissue]
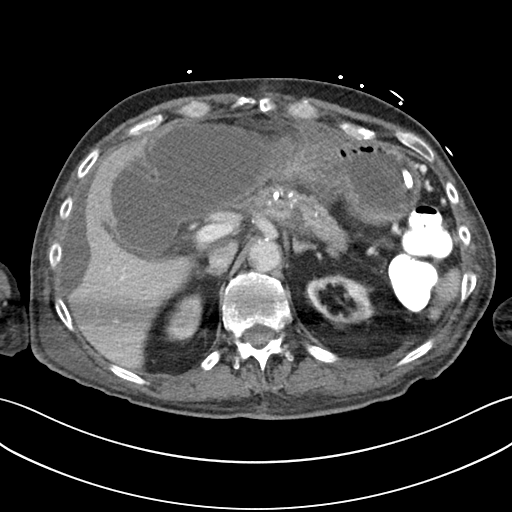
[im 76/109  bone]
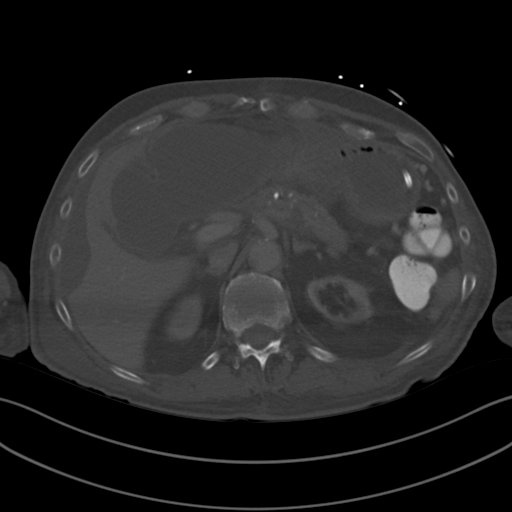
[im 87/109  soft-tissue]
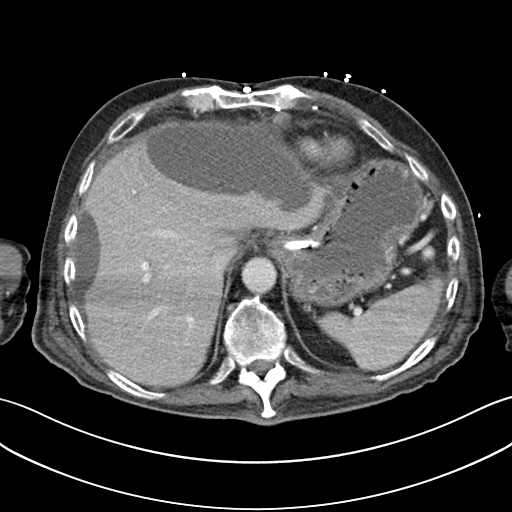
[im 92/109  soft-tissue]
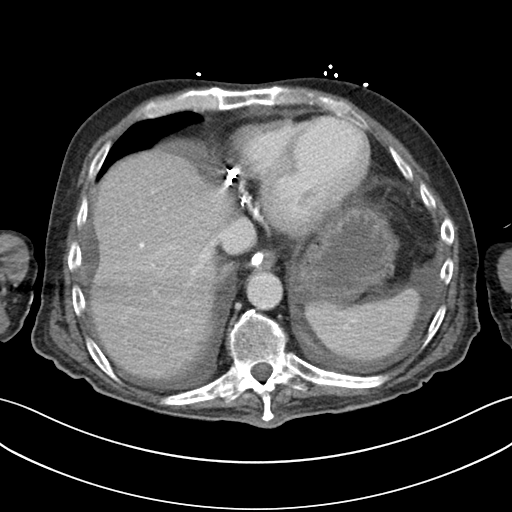
[im 103/109  soft-tissue]
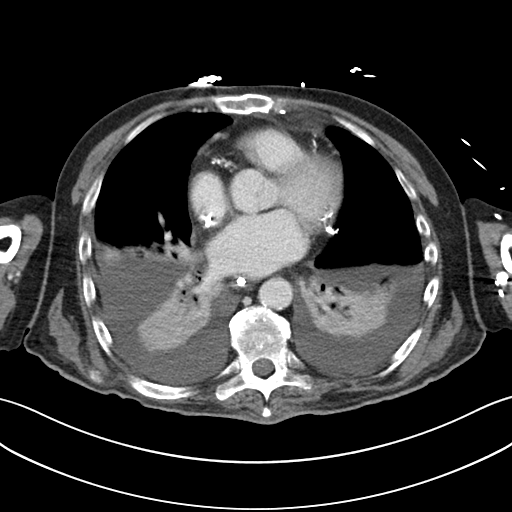

[Series 5: coronal soft tissue · coronal · 0.87mm/px · 3 of 98 slices shown]
[im 33/98  soft-tissue]
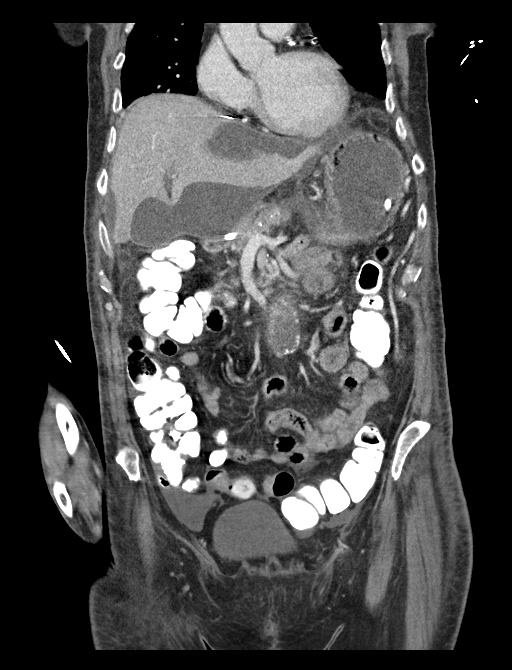
[im 44/98  soft-tissue]
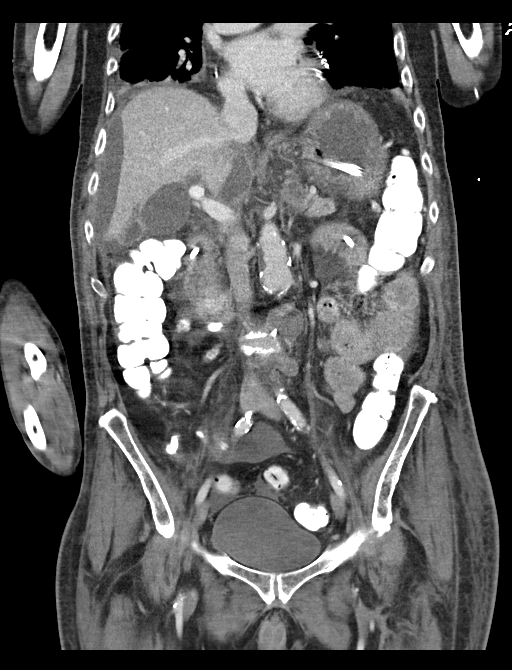
[im 54/98  soft-tissue]
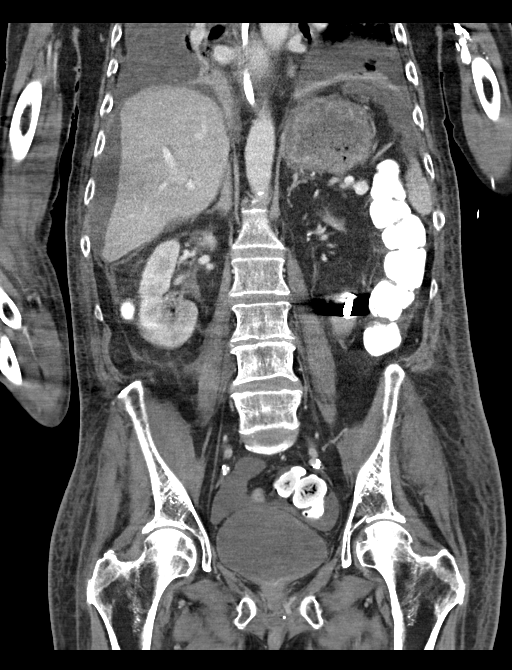

[15 of 46 positions shown; findings below may reference images not displayed]

FINDINGS: BILATERAL moderate-sized pleural effusions and complete lower lobe
atelectasis.

Scattered atherosclerotic disease changes with postsurgical changes
of CABG.

Aneurysmal dilatation infrarenal abdominal aorta up to 4.5 x 4.1 cm
image 54.

Tip of feeding tube within proximal jejunum.

Large subcapsular collections at the liver consistent with
pseudocysts.

Largest at LEFT lobe 18.1 x 7.1 x 10.5 cm, minimally larger.

Smaller collection at lateral RIGHT lobe 9.5 x 2.2 x 11.4 cm, new.

Additional poorly defined collection a noted along superior aspect
of proximal pancreatic tail 5.4 x 2.5 x 2.2 cm question phlegmon
versus developing pseudocyst, minimally larger.

Additional fluid is seen dissecting into the caudate lobe liver.

Pancreatic parenchymal calcifications consistent with chronic
calcific pancreatitis.

Scattered mild peripancreatic edema.

Splenic vein is occluded with patency of portal and superior
mesenteric veins.

Collaterals noted LEFT upper quadrant.

No additional hepatic, splenic adrenal a or RIGHT renal
abnormalities.

Marked atrophy of LEFT kidney.

Stomach and bowel loops normal appearance.

Free intraperitoneal fluid greatest in pelvis.

Unremarkable bladder and ureters.

No mass, adenopathy, free air or hernia.

Bones demineralized.

Avascular necrosis RIGHT femoral head with maintenance of femoral
head morphology.
IMPRESSION: Acute on chronic calcific pancreatitis.

Multiple pseudocysts largest at LEFT lobe liver 18.1 x 7.2 x
cm, all increased in size.

Ascites.

Prior splenic vein occlusion with collateral flow in LEFT upper
quadrant.

Moderate-sized pleural effusions and complete BILATERAL lower lobe
atelectasis.

Abdominal aortic aneurysm 4.5 x 4.1 cm not significantly changed.

## 2016-01-19 IMAGING — CR DG CHEST 1V PORT
1 series · 1 of 1 positions shown · non-contrast
Comparison: April 08, 2015.

CLINICAL DATA: Renal failure.

EXAM:
PORTABLE CHEST - 1 VIEW

[AP]
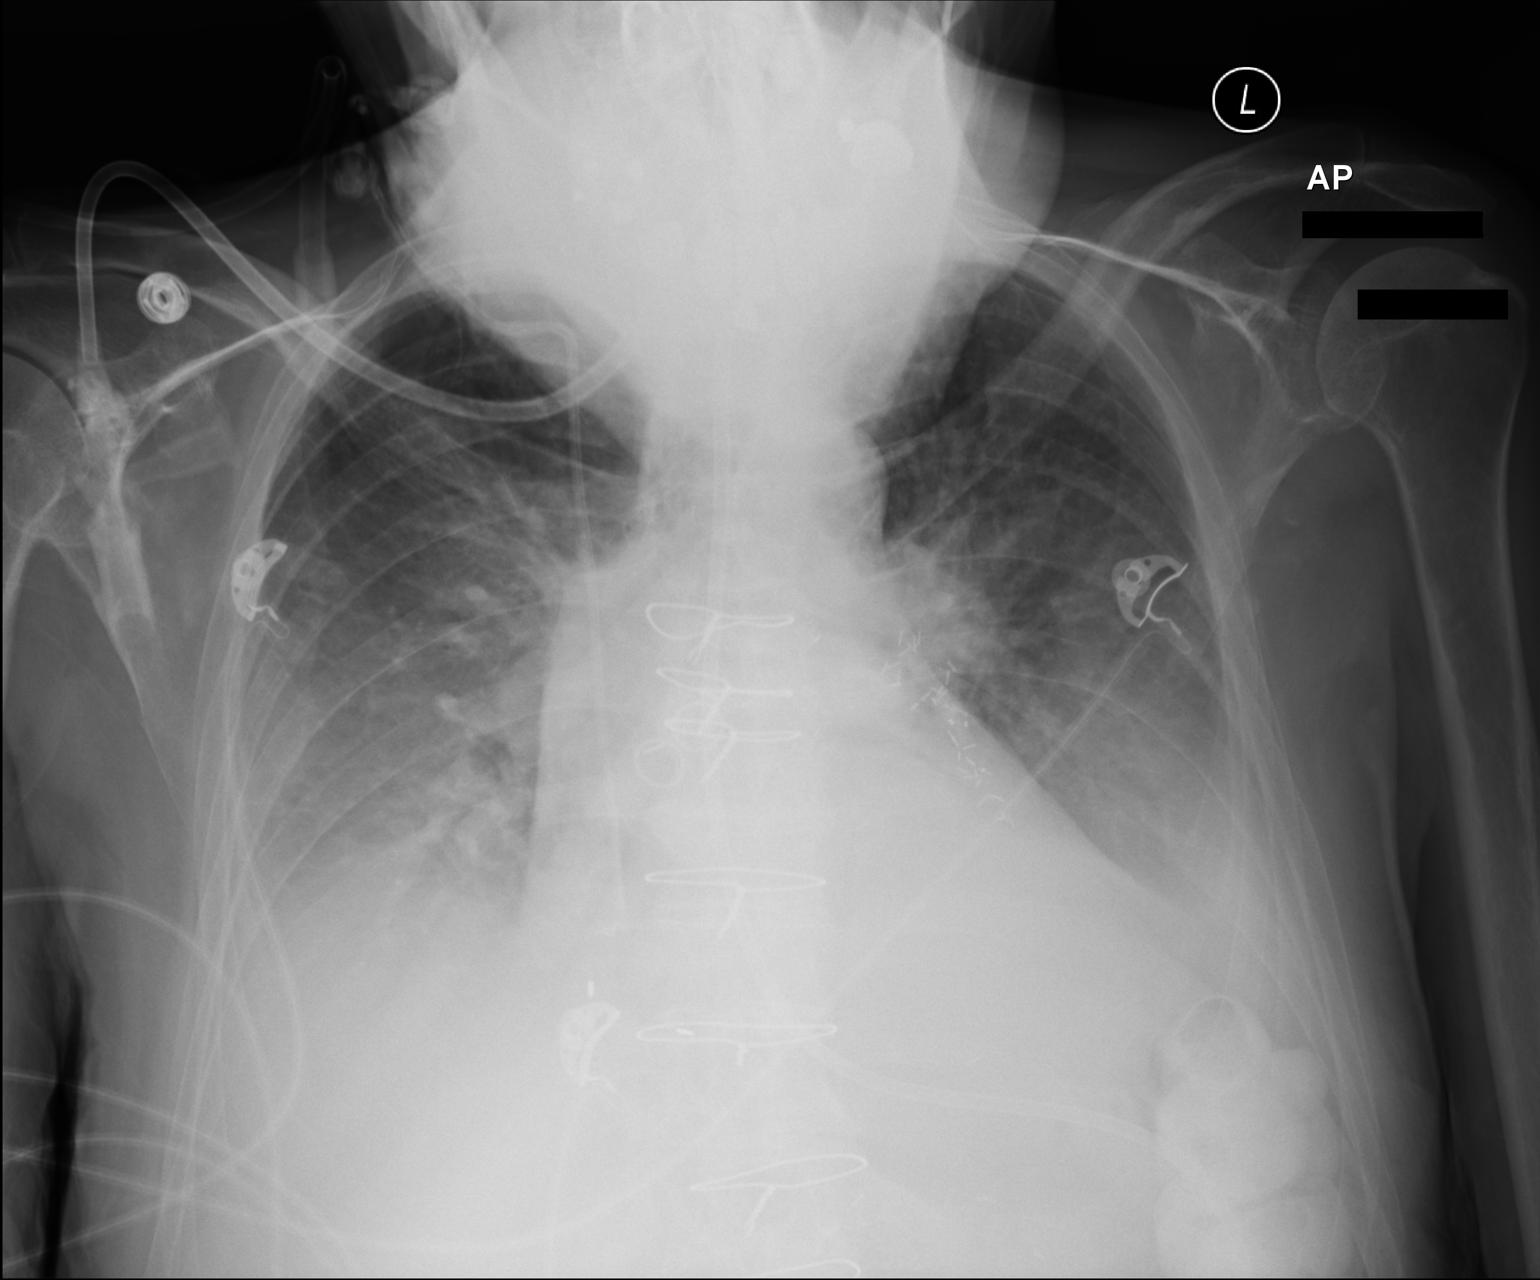

[1 of 1 positions shown; findings below may reference images not displayed]

FINDINGS: Stable cardiomegaly. Status post coronary artery bypass graft.
Stable right internal jugular catheter. Feeding tube is seen
entering the stomach. No pneumothorax is noted. Stable bilateral
pulmonary edema and pleural effusions are noted compared to prior
exam.
IMPRESSION: Stable bilateral pulmonary edema and pleural effusions compared to
prior exam.
